# Patient Record
Sex: Female | Born: 1940 | Hispanic: No | Smoking: Never smoker
Health system: Southern US, Community
[De-identification: ages and names within clinical notes are randomized; demographics above are authoritative.]

## PROBLEM LIST (undated history)

## (undated) DIAGNOSIS — R1900 Intra-abdominal and pelvic swelling, mass and lump, unspecified site: Secondary | ICD-10-CM

## (undated) DIAGNOSIS — E785 Hyperlipidemia, unspecified: Secondary | ICD-10-CM

## (undated) DIAGNOSIS — N189 Chronic kidney disease, unspecified: Secondary | ICD-10-CM

## (undated) DIAGNOSIS — K219 Gastro-esophageal reflux disease without esophagitis: Secondary | ICD-10-CM

## (undated) DIAGNOSIS — I10 Essential (primary) hypertension: Secondary | ICD-10-CM

## (undated) DIAGNOSIS — I639 Cerebral infarction, unspecified: Secondary | ICD-10-CM

## (undated) DIAGNOSIS — I4891 Unspecified atrial fibrillation: Secondary | ICD-10-CM

## (undated) DIAGNOSIS — E119 Type 2 diabetes mellitus without complications: Secondary | ICD-10-CM

## (undated) DIAGNOSIS — F0390 Unspecified dementia without behavioral disturbance: Secondary | ICD-10-CM

## (undated) HISTORY — PX: ABDOMINAL HYSTERECTOMY: SHX81

## (undated) HISTORY — PX: LAPAROTOMY: SHX154

## (undated) HISTORY — DX: Unspecified dementia, unspecified severity, without behavioral disturbance, psychotic disturbance, mood disturbance, and anxiety: F03.90

## (undated) HISTORY — DX: Intra-abdominal and pelvic swelling, mass and lump, unspecified site: R19.00

## (undated) HISTORY — DX: Essential (primary) hypertension: I10

## (undated) HISTORY — DX: Cerebral infarction, unspecified: I63.9

---

## 2000-07-26 ENCOUNTER — Inpatient Hospital Stay (HOSPITAL_COMMUNITY): Admission: EM | Admit: 2000-07-26 | Discharge: 2000-08-02 | Payer: Self-pay | Admitting: Emergency Medicine

## 2000-07-26 ENCOUNTER — Encounter: Payer: Self-pay | Admitting: Neurology

## 2000-07-27 ENCOUNTER — Encounter: Payer: Self-pay | Admitting: Neurology

## 2001-12-15 ENCOUNTER — Encounter: Payer: Self-pay | Admitting: Emergency Medicine

## 2001-12-15 ENCOUNTER — Emergency Department (HOSPITAL_COMMUNITY): Admission: EM | Admit: 2001-12-15 | Discharge: 2001-12-15 | Payer: Self-pay | Admitting: Emergency Medicine

## 2002-07-31 ENCOUNTER — Ambulatory Visit (HOSPITAL_COMMUNITY): Admission: RE | Admit: 2002-07-31 | Discharge: 2002-07-31 | Payer: Self-pay | Admitting: Pulmonary Disease

## 2002-07-31 ENCOUNTER — Encounter: Payer: Self-pay | Admitting: Pulmonary Disease

## 2002-08-01 ENCOUNTER — Ambulatory Visit (HOSPITAL_COMMUNITY): Admission: RE | Admit: 2002-08-01 | Discharge: 2002-08-01 | Payer: Self-pay | Admitting: Pulmonary Disease

## 2002-08-01 ENCOUNTER — Encounter: Payer: Self-pay | Admitting: Pulmonary Disease

## 2002-08-21 ENCOUNTER — Other Ambulatory Visit: Admission: RE | Admit: 2002-08-21 | Discharge: 2002-08-21 | Payer: Self-pay | Admitting: Obstetrics and Gynecology

## 2003-01-05 ENCOUNTER — Emergency Department (HOSPITAL_COMMUNITY): Admission: EM | Admit: 2003-01-05 | Discharge: 2003-01-05 | Payer: Self-pay | Admitting: Emergency Medicine

## 2003-05-07 ENCOUNTER — Inpatient Hospital Stay (HOSPITAL_COMMUNITY): Admission: EM | Admit: 2003-05-07 | Discharge: 2003-05-11 | Payer: Self-pay | Admitting: *Deleted

## 2008-05-12 ENCOUNTER — Ambulatory Visit: Payer: Self-pay | Admitting: Cardiovascular Disease

## 2008-05-12 ENCOUNTER — Inpatient Hospital Stay (HOSPITAL_COMMUNITY): Admission: EM | Admit: 2008-05-12 | Discharge: 2008-05-16 | Payer: Self-pay | Admitting: Internal Medicine

## 2008-05-12 ENCOUNTER — Encounter: Payer: Self-pay | Admitting: Emergency Medicine

## 2008-05-13 ENCOUNTER — Encounter (INDEPENDENT_AMBULATORY_CARE_PROVIDER_SITE_OTHER): Payer: Self-pay | Admitting: Internal Medicine

## 2010-10-11 NOTE — Discharge Summary (Signed)
Natalie Rodgers, Natalie Rodgers           ACCOUNT NO.:  [id number]   MEDICAL RECORD NO.:  [id number]          PATIENT TYPE:  INP   LOCATION:  [place]                         FACILITY:  [facility]   PHYSICIAN:  [name], MD    DATE OF BIRTH:  [birth year]   DATE OF ADMISSION:  05/12/2008  DATE OF DISCHARGE:  05/16/2008                               DISCHARGE SUMMARY   DISCHARGE DIAGNOSES:  1. 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  2. Frequent falls secondary to hyponatremia.  3. Hypertension.  4. Hyperlipidemia.  5. Schizophrenia.  6. History of cerebrovascular accident.  7. Diabetes mellitus.   DISCHARGE MEDICATIONS:  1. Aspirin 325 mg once a day.  2. Lipitor 40 mg once a day.  3. Metformin 500 mg b.i.d.  4. Omeprazole 20 mg once a day.  5. Carvedilol 6.25 mg b.i.d.  6. Zyprexa 10 mg once a day.  7. Lotrel 10/40 mg once a day.  8. Trazodone 25 mg at bedtime.   DISCONTINUED MEDICATIONS:  Hydrochlorothiazide.   CONSULTATIONS:  None.   PROCEDURES:  None.   RADIOLOGY STUDIES:  1. Chest x-ray on May 12, 2008, showed cardiomegaly without acute      problem.  2. CT scan of the head without contrast on May 12, 2008, showed      no evidence of acute ischemia, and it showed mild atrophy.  3. MRI of the brain without contrast showed no evidence for acute      ischemia, and it showed old ischemic change in the left cerebral      hemisphere.   FOLLOWUP:  With his primary doctor in 1-2 weeks.   COURSE OF HOSPITALIZATION:  This is a 70 year old female who was brought  to the hospital on May 12, 2008, with main complaint of fatigue and  frequent falls.  Initial workup showed hyponatremia with sodium of 111.  Patient was admitted to step-down unit.  Her hydrochlorothiazide was  discontinued.  Patient was started on normal saline.  Her sodium level  has been checked q.6 h.  Her sodium has been increasing  appropriately.  Today at the time of her discharge, her sodium was back to normal at  135.  Her TSH was checked out, and it was within normal limits.  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   RECOMMENDATIONS:  Discontinue her hydrochlorothiazide and to repeat her  sodium level within 1 week.   Otherwise, Fajr continues to remain stable in the hospital.  Patient will be discharged on all preadmission medications except HCTZ.   Total assessment time is 40 minutes.      [name], MD  Electronically Signed     NAE/MEDQ  D:  05/16/2008  T:  05/16/2008  Job:  [id number]

## 2010-10-11 NOTE — H&P (Signed)
NAMEELDER, WILGER NO.:  [id number]   MEDICAL RECORD NO.:  [id number]          PATIENT TYPE:  EMS   LOCATION:  [place]                         FACILITY:  [facility]   PHYSICIAN:  Rise Patience, MDDATE OF BIRTH:  08-23-1940   DATE OF ADMISSION:  05/12/2008  DATE OF DISCHARGE:                              HISTORY & PHYSICAL   History obtained from ER physician, Dr. Tomi Bamberger, and ER records and the  patient's nursing home records as the patient is not a good historian.   PRIMARY CARE PHYSICIAN:  Per records is Dr. Barth Kirks.   CHIEF COMPLAINT:  Fatigue and frequent falls.   HISTORY OF PRESENT ILLNESS:  A 70 year old female with history of  previous CVA, diabetes mellitus type 2, hypertension, schizophrenia,  history of Mallory-Weiss tear, was brought into the ER after the patient  had frequent falls over the last 2-3 days.  Per ER physician, the  patient has not lost consciousness and the patient is not able to give a  good history if she had lost consciousness.  There were not any new  focal deficits found.  The patient did not complain of any chest pain or  shortness of breath.  No seizure-like activities.  No abdominal pain,  nausea, vomiting or diarrhea.  In the ER, the patient was found to have  a sodium of 111.  The patient has been admitted for further management  of her near syncope and severe hyponatremia.  At this time, the patient  is not in any acute distress.  Denies any chest pain, shortness of  breath, abdominal pain, nausea, vomiting, diarrhea, fever or chills,  headache or any weakness of limb except for fatigue.   PAST MEDICAL HISTORY:  1. Hypertension.  2. Hyperlipidemia.  3. CVA.  4. Schizophrenia.  5. Diabetes mellitus type 2.   PAST SURGICAL HISTORY:  1. The patient has had a left trimalleolar ankle fracture.  2. Mallory-Weiss tear.   MEDICATIONS PRIOR TO ADMISSION:  1. Aspirin 325 mg p.o. daily.  2. Lipitor 40 mg p.o. daily.  3.  Hydrochlorothiazide 25 mg p.o. daily.  4. Metformin 500 mg 1 tablet p.o. b.i.d.  5. Omeprazole 20 mg p.o. daily.  6. Carvedilol 6.25 mg p.o. b.i.d.  7. Zyprexa 10 mg p.o. daily.  8. Lotrel 10/40 mg p.o. daily.  9. Trazodone 50 mg takes 1-1/2 tablets p.o. at bedtime.   ALLERGIES:  NO KNOWN DRUG ALLERGIES.   FAMILY HISTORY:  Not known.   REVIEW OF SYSTEMS:  As per history of present illness.  Nothing else  significant.   PHYSICAL EXAMINATION:  GENERAL:  The patient examined at bedside not in  acute distress.  VITAL SIGNS:  Blood pressure 176/76, pulse 88 per minute, temperature  97.9, respirations 18 per minute, O2 sat 100%.  HEENT:  Anicteric.  No pallor.  Tongue is midline.  There is mild  deviation of his face to the left.  Patient has had a previous stroke.  CHEST:  Bilateral air entry present.  No rhonchi.  No crepitation.  HEART:  S1 and S2 heard.  ABDOMEN:  Soft, nontender.  Bowel  sounds heard.  There is mild  distention.  No guarding or rigidity.  CNS:  The patient is alert, awake, oriented to name and place.  LOWER EXTREMITIES:  Peripheral pulses felt.  No edema.   DIAGNOSTICS:  1. CT of the head without contrast shows no definite acute      intracranial findings, remote infarctions and chronic small vessel      disease changes described above.  No hemorrhage.  Left upper      parietal scalp hematoma.  2. Chest x-ray:  There is enlargement of the cardiac silhouette with      no pulmonary edema evident.  No fractures evident.   LABORATORY DATA:  CBC:  WBC is 17.3, hemoglobin 13.4, hematocrit 39.2,  platelets 343, neutrophils 90%.  Basic metabolic panel:  Sodium 99991111,  potassium 3.5, chloride 70, carbon dioxide 25, glucose 143, BUN 12,  creatinine 0.6, calcium 9.2, anion gap 16, calcium 9.2.  UA shows  negative for nitrites, leukocyte, wbc's 0.   ASSESSMENT:  1. Severe hyponatremia probably from dehydration and      hydrochlorothiazide.  2. Near syncope from  dehydration.  3. Anion gap metabolic acidosis.  4. Diabetes mellitus type 2.  5. Old cerebrovascular accident.  6. Schizophrenia.  7. Hypertension.   PLAN:  We will admit the patient to step-down unit for close  observation.  We will discontinue hydrochlorothiazide and will recommend  not to restart hydrochlorothiazide in this patient in the future.  We  will place patient on IV fluid normal saline.  Check BMET every 6 hours.  Check urine for sodium osmolality.  Check a TSH and cortisol levels.  We  will get blood cultures as the patient has leukocytosis.  At this time,  it does not look as the patient has any infection.  The patient has no  fever.  Chest x-ray looks fine and UA has no features of UTI.  We will  also get urine culture.  Further recommendation as patient condition  evolves.  I have tried to reach the patient's contact number provided in  the chart and was not able to reach.     Rise Patience, MD  Electronically Signed    ANK/MEDQ  D:  05/12/2008  T:  05/12/2008  Job:  [id number]

## 2010-10-14 NOTE — H&P (Signed)
NAMEBIANCE, DIERKER NO.:  [id number]   MEDICAL RECORD NO.:  [id number]                   PATIENT TYPE:  INP   LOCATION:  2112                                 FACILITY:  [facility]   PHYSICIAN:  Anderson Malta, M.D.                 DATE OF BIRTH:  September 14, 1940   DATE OF ADMISSION:  05/07/2003  DATE OF DISCHARGE:                                HISTORY & PHYSICAL   CHIEF COMPLAINT:  Left ankle pain.   HISTORY OF PRESENT ILLNESS:  Natalie Rodgers is a 70 year old female who  fell today at the nursing home.  She sustained an injury to the left ankle.  She denies any loss of consciousness or any other orthopedic complaints.   ALLERGIES:  She has no known drug allergies.   CURRENT MEDICATIONS:  Aspirin, Ambien, Lotrel, hydrochlorothiazide,  lisinopril, Zyprexa, Senokot.   PAST MEDICAL HISTORY:  Past medical is notable for:  1. Non-insulin-dependent diabetes.  2. Hypertension.  3. Schizophrenia.  4. CVA.   FAMILY HISTORY:  Family history is noncontributory.   SOCIAL HISTORY:  The patient lives at the nursing home.   REVIEW OF SYSTEMS:  There is no chest pain, fever, chills or shortness of  breath.  Other systems are reviewed and are negative.   PHYSICAL EXAMINATION:  GENERAL:  On physical examination, the patient is not  in much distress.  HEENT:  Normal.  NECK:  Neck has full range of motion.  UPPER EXTREMITIES:  Bilateral upper extremities have full range of motion at  the wrists, elbows and shoulders.  CHEST:  Chest is clear to auscultation.  HEART:  Heart beat is regular rhythm.  ABDOMEN:  Exam is benign.  LOWER EXTREMITIES:  The patient does have a palpable left DP pulse 2+/4.  Sensation grossly intact on dorsoplantar aspect of the foot.  Skin is intact  on the medial and lateral malleoli.  Swelling and ecchymosis and bruising  are present.   IMAGING STUDIES:  X-rays show trimalleolar ankle fracture with displacement.   White count 9.9,  hemoglobin 12.2.  Sodium 134, potassium 4.1, BUN 14,  creatinine 0.8.   EKG and chest x-ray show no active processes.   IMPRESSION:  Trimalleolar ankle fracture.   PLAN:  Open reduction and internal fixation.  Risks and benefits including  but not limited to infection, nonunion, malunion, nerve and vessel damage,  amputation, DVT and death are discussed with the patient; patient  understands and will proceed; all questions answered.                                                Anderson Malta, M.D.    GSD/MEDQ  D:  05/07/2003  T:  05/08/2003  Job:  [id number]

## 2010-10-14 NOTE — Op Note (Signed)
Natalie Rodgers, Natalie Rodgers NO.:  [id number]   MEDICAL RECORD NO.:  [id number]                   PATIENT TYPE:  INP   LOCATION:  2112                                 FACILITY:  [facility]   PHYSICIAN:  Anderson Malta, M.D.                 DATE OF BIRTH:  1941/05/03   DATE OF PROCEDURE:  05/07/2003  DATE OF DISCHARGE:                                 OPERATIVE REPORT   PREOPERATIVE DIAGNOSIS:  Left trimalleolar ankle fracture.   POSTOPERATIVE DIAGNOSIS:  Left trimalleolar ankle fracture.   PROCEDURE:  Left trimalleolar ankle fracture open reduction internal  fixation.   SURGEON:  Anderson Malta, M.D.   ANESTHESIA:  General endotracheal.   ESTIMATED BLOOD LOSS:  25 cc.   DRAINS:  None.   TOURNIQUET TIME:  Ankle Esmarched for 60 minutes.   PROCEDURE IN DETAIL:  The patient was brought to the operating room where  general endotracheal anesthesia was induced.  Preoperative antibiotics were  administered.  The left leg was prepped with Duraprep solution and draped in  a sterile manner.  Charlie Pitter was used to cover the operative field.  The leg was  elevated and exsanguinated with the Esmarch wrap.  The Esmarch wrap was  taken up to the mid-calf level where it was maintained.  Lateral incision  was made measuring approximately 9 cm.  Skin and subcutaneous tissue were  sharply divided.  Superficial peroneal nerve was mobilized anteriorly.  The  fracture site was visualized.  The fracture was reduced under direct  visualization to an anatomic fashion.  Lag screw was placed from proximal  anterior to distal posterior.  The 3.5 cortical screw gave excellent  compression-fixation of the fracture.  A seven-hole one-third tubular plate  was then placed to further stabilize the fracture.  This tubular plate was  the locking variety.  Six holes were filled with locking screws.  Reduction  in the AP and lateral planes under fluoroscopy looked anatomic.   At this time the  medial side was approached.  A J-shaped incision was made  centered over the fracture of the medial malleolus.  The skin and  subcutaneous tissue were sharply divided.  The fracture site was visualized.  The loose fragments of bone were removed from within the joint.  The medial  malleolus was the anatomically reduced and held in position with a towel  clamp.  Two 4 mm cancellous screws were then placed.  Reduction was  anatomic.  Syndesmosis was tested and was found to be stable.  At this time  the tourniquet was released.  Bleeding points encountered were controlled  with electrocautery.  Thorough irrigation was performed in both incisions.  Both incisions were then closed using interrupted and inverted 3-0 Vicryl  suture and 3-0 nylon suture.  Bulky posterior splint was applied.  The  patient tolerated the procedure well without any complications.  Anderson Malta, M.D.   GSD/MEDQ  D:  05/07/2003  T:  05/08/2003  Job:  [id number]

## 2010-10-14 NOTE — Consult Note (Signed)
[facility]  Patient:    Natalie Rodgers, Natalie Rodgers                    MRN: [id number] Proc. Date: 08/01/00 Adm. Date:  [id number] Attending:  Lenor Coffin CC:         Princess Bruins. Gaynell Face, M.D.   Consultation Report  REASON FOR CONSULTATION:  I was asked to see this 70 year old lady for evaluation of voiding dysfunction.  She was hospitalized with a stroke.  She was a rest home patient.  She also has a history of schizophrenia.  Current medications are Zantac, Cogentin, and Thorazine on admission. Allergies to drugs are denied.  A Foley catheter was placed earlier this week and then it was removed for a voiding trial.  She voided 150-300 cc at a time but had a very large PVR, so Foley catheter was reinserted.  A recent urine culture is negative.  Her renal function is normal with a serum creatinine of 0.7.  PHYSICAL EXAMINATION:  GENERAL:  She is difficult to communicate because of medical problems and some language barriers.  ABDOMEN:  Foley catheter is in place draining clear urine.  The abdomen is soft and nontender.  I suggest we try and get this Foley out with a voiding trial.  I will put her on Flomax 0.4 mg with a starter dose tonight and then one capsule 30 minutes PC breakfast each day.  We can try the Foley out on Friday, August 03, 2000, and then do I&O caths every shift for PVRs, and I will recheck her next week when I return from being out of town, on Monday, August 06, 2000. DD:  08/01/00 TD:  08/02/00 Job: [id number]

## 2010-10-14 NOTE — Consult Note (Signed)
NAMEKIJAH, POMPILIO NO.:  [id number]   MEDICAL RECORD NO.:  [id number]                   PATIENT TYPE:  INP   LOCATION:  2112                                 FACILITY:  [facility]   PHYSICIAN:  Jeneen Rinks L. Rolla Flatten., M.D.          DATE OF BIRTH:  Apr 28, 1941   DATE OF CONSULTATION:  05/07/2003  DATE OF DISCHARGE:                                   CONSULTATION   REASON FOR CONSULTATION:  GI bleeding.   HISTORY:  Ms. Violet is a 70 year old woman without lives in a rest home  due to multiple problems, who apparently fell and fractured her ankle.  She  was sent to the emergency room.  According to the note from the transport,  think she received ibuprofen 800 mg due to pain.  I do not have a complete  list of her medications.  I do not see any obvious drug list from the rest  home, so not entirely sure what the patient was taking.  She fractured her  ankle and went to surgery, had the ankle repaired surgically by Dr. Alphonzo Severance.  Intubation prior to the procedure was very difficulty, and several  people had to attempt it, and they were finally able to get her intubated.  They had a oral suction device in, and only a small amount of blood at the  time of surgery was seen.  Surgery went uneventfully.  In the recovery room,  she vomited up a large quality of bright red blood.  NG tube was placed and  was draining bright red blood.  The patient is awake now, still somewhat  confused and disoriented, groggy.  Not sure what her normal mental state is.   Her hemoglobin was 12.2 on admission, and repeat hemoglobin showed a  hemoglobin drop to 11.4.  We were asked to see her due to GI bleeding.  The  patient is awake enough to answer some questions.  She denies every having a  history of previous ulcer disease and denies a history of liver disease.   MEDICATIONS ON ADMISSION:  Ambien, aspirin, Lotrel. Hydrochlorothiazide,  Lisinopril, Zyprexa, Senokot.   ALLERGIES:  No known drug allergies.   PAST MEDICAL HISTORY:  1. History of previous strokes making it difficult for her to get around.  2. Schizophrenia.  3. Non-insulin-dependent diabetes.  4. Hypertension.   PAST SURGICAL HISTORY:  Her only known surgery at this point is surgery  recently on her ankle.   SOCIAL HISTORY:  She does have three children according to the old records  but is currently living in a rest home.  She denies any alcohol use or any  cigarette use.   PHYSICAL EXAMINATION:  VITAL SIGNS:  When I examined her in the recovery  room, her vital signs were stable follow surgery.  GENERAL:  She is awake but groggy.  NG tube is draining a small amount of  thin, bright red material  after lavage.  HEENT:  Eyes: Sclerae anicteric.  Throat: I did examine her with her mouth  open as much she would with a wipe and tongue blade.  We also put the  Yankauer suction tip in the back of her throat and sucked a small amount of  bright blood.  I was able to see ecchymoses of the palate without clear  active bleeding but some bright bleeding in the Yankauer suction.  This was  a small amount.  NECK:  Fat, short, and fairly immobile which probably explains the  intubation difficulties.  HEART:  Regular rate and rhythm without murmurs or gallops.  LUNGS:  Clear.  ABDOMEN:  Soft, nontender, with good bowel sounds.   IMPRESSION:  Acute upper gastrointestinal bleed, either due from a lesion in  the stomach, the esophagus, or more likely from traumatic intubation.  Many  possibilities here including occult ulcer disease, Mallory Weiss tear,  phalangeal tear during intubation, etc.  Hopefully all of these will  resolve without any specific intervention.  The patient does seem relatively  stable at this point.   PLAN:  I agree with continuing NG suction and empirically giving IV  Protonix.  If her NG aspirate is not clear, she will likely need an  endoscopy and possibly an EMG  evaluation.                                               James L. Rolla Flatten., M.D.    Jaynie Bream  D:  05/07/2003  T:  05/08/2003  Job:  [id number]   cc:   G. Alphonzo Severance, M.D.  [address] [place]  [place] [address]  Fax: [phone]

## 2010-10-14 NOTE — Discharge Summary (Signed)
NAMENYKERRIA, STUART NO.:  [id number]   MEDICAL RECORD NO.:  [id number]                   PATIENT TYPE:  INP   LOCATION:  [id number]                                 FACILITY:  [facility]   PHYSICIAN:  Anderson Malta, M.D.                 DATE OF BIRTH:  07-31-1940   DATE OF ADMISSION:  05/07/2003  DATE OF DISCHARGE:  05/11/2003                                 DISCHARGE SUMMARY   DISCHARGE DIAGNOSES:  1. Left trimalleolar ankle fracture.  2. Gastrointestinal bleed secondary to Mallory-Weiss tear.   PROCEDURE:  Left trimalleolar ankle fracture reduction and internal  fixation.   HOSPITAL COURSE:  Natalie Rodgers is a 70 year old patient who injured  herself in the nursing home on the day of admission.  She sustained a closed  trimalleolar ankle fracture.  Patient was taken to surgery that day where  she underwent open reduction with internal fixation of the ankle fracture.  In the recovery room, the patient was noted to be throwing up large amounts  of blood.  GI consultation was obtained.  Upper endoscopy the following  morning demonstrated a Mallory-Weiss tear as well as evidence of a traumatic  intubation.  No active bleeding seen.  The patient was transfused with two  units of packed red blood cells and had a stable hematocrit with no further  hematemesis.  The patient was mobilized with physical therapy and  nonweightbearing on the left lower extremity.  Toes were perfused and mobile  at the time of discharge.  Hemoglobin was 10.1 at the time of discharge.   DISCHARGE MEDICATIONS:  1. Admission medications.  2. Aspirin 325 mg p.o. daily.  3. Protonix 40 mg p.o. daily.   She will follow up with me in about 7-8 days for suture removal.  She will  continue with a nonweightbearing status, nonweightbearing in the splint.                                                Anderson Malta, M.D.    GSD/MEDQ  D:  05/10/2003  T:  05/10/2003  Job:  [id number]

## 2010-10-14 NOTE — Op Note (Signed)
Natalie Rodgers, DORING NO.:  [id number]   MEDICAL RECORD NO.:  [id number]                   PATIENT TYPE:  INP   LOCATION:  2112                                 FACILITY:  [facility]   PHYSICIAN:  Jeneen Rinks L. Rolla Flatten., M.D.          DATE OF BIRTH:  Apr 28, 1941   DATE OF PROCEDURE:  05/08/2003  DATE OF DISCHARGE:                                 OPERATIVE REPORT   PROCEDURE:  Esophagogastroduodenoscopy.   MEDICATIONS GIVEN:  Cetacaine spray, Fentanyl 50 mcg, Versed 5 mg IV.   INDICATIONS FOR PROCEDURE:  The patient had an ankle fracture and went to  surgery last night.  She had an difficulty intubation following surgery,  vomited up a large quantity of blood, NG continued to drain blood.  We  watched her during the night, it has not cleared up, her hemoglobin has  dropped and she has needed one unit of transfusion.   DESCRIPTION OF PROCEDURE:  The procedure was explained to the patient and  consent obtained.  With the patient in the left lateral decubitus position,  the scope was inserted blindly and advanced.  There was a lot of edema and  ecchymosis in the pharynx but no active bleeding.  We passed down into the  stomach.  In the distal esophagus was a Mallory-Weiss tear/esophageal ulcer  that was linear just above the GE junction, was not actively bleeding.  There was a lot of old blood in the stomach, no active bleeding, no  ulceration.  The pyloric channel was seen, was passed, and was normal.  No  bleeding site was seen.  The scope was withdrawn, the stomach examined  again, again, no active bleeding site, a lot of old coffee ground material.  The Mallory-Weiss tear/ulcer was photographed.  The distal and proximal  esophagus were seen well endoscopically.  As we withdrew back into the  pharynx in the vicinity of the vocal cords, a lot of edema and ecchymosis  was seen, but no active bleeding.  The patient tolerated the procedure well.   ASSESSMENT:  1. 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  2. Pharyngeal trauma secondary to intubation not bleeding.   PLAN:  Will continue on PPI, give ice chips and clear liquids, follow  clinically.                                               James L. Rolla Flatten., M.D.    Jaynie Bream  D:  05/08/2003  T:  05/08/2003  Job:  [id number]   cc:   G. Alphonzo Severance, M.D.  [address] [place]  [place] [address]  Fax: [phone]

## 2010-10-14 NOTE — H&P (Signed)
[place]. [facility]  Patient:    Natalie Rodgers, Natalie Rodgers                    MRN: [id number] Adm. Date:  [id number] Attending:  Willy Eddy CC:         [name]. [name], M.D.   History and Physical  HISTORY OF PRESENT ILLNESS:  Natalie Rodgers is a 70 year old, right-handed female, born on [birth year], with a history of schizophrenia, who is at [facility] currently.  This patient was brought in with a greater than 24-hour history of right-sided weakness.  The problems began yesterday.  The patient noted some weakness on the right side. No numbness or headache.  Possibly some slurred speech.  The patient had falls last evening.  The weakness persisted and the patient was brought into the hospital today for an evaluation.  The patient again denies any visual changes, numb or tingling sensation, or blackout episodes.  The patient denies problems with swallowing.  The patient is being admitted at this time for a mild right hemiparesis with probable deep left brain stroke.  A CT of the head shows significant calcifications of the pineal gland, basal ganglia regions, and choroid plexus regions, and may show what appears to be some acute left deep parietal white matter and frontal white matter changes.  Some chronic ischemic changes seen in the head of the caudate basal ganglia regions.  PAST MEDICAL HISTORY: 1. Significant for a history of new onset of stroke with right hemiparesis. 2. Obesity. 3. History of schizophrenia.  CURRENT MEDICATIONS: 1. Zantac 150 mg b.i.d. 2. Cogentin 1 mg q.d. 3. Thorazine 200 mg q. night.  ALLERGIES:  No known drug allergies.  SOCIAL HISTORY:  Does not smoke or drink.  SOCIAL HISTORY:  The patient is divorced.  She lives at the [facility].  She has three children.  As far as she knows, they are alive and well.  FAMILY HISTORY:  Her mother died with a myocardial infarction.   Fathers history is unknown.  The patient has one brother and five sisters, who as far as she knows, are alive and well.  REVIEW OF SYSTEMS:  Notable for no fevers, no headaches, no visual field changes, no swallowing problems, no shortness of breath, chest pain, nausea, or vomiting.  The patient denies any problems controlling the bowels and the bladder.  Has had some difficulty walking, and has had some recent falls.  PHYSICAL EXAMINATION:  VITAL SIGNS:  Blood pressure 175/103, heart rate 103, respirations 24, temperature afebrile.  GENERAL:  This patient is a moderately-obese female who is alert, cooperative at the time of examination.  Speech:  Has a heavy accent.  Difficult to understand.  Possibly slightly dysarthric.  HEENT:  Extraocular movements full.  Pupils equal, round, reactive to light. Discs are flat bilaterally.  Cataracts are seen bilaterally.  NECK:  Supple, no carotid bruits noted.  LUNGS:  Clear to auscultation and percussion.  CARDIOVASCULAR:   Reveals a regular rate and rhythm.  No obvious murmurs or rubs noted.  ABDOMEN:  Reveals an obese abdomen.  No organomegaly or tenderness noted.  EXTREMITIES:  Without significant edema, possibly 1+ edema at the ankles bilaterally.  NEUROLOGIC:  Cranial nerves as above.  Facial symmetry appears to be present. The patient has good pinprick sensation on the face bilaterally.  The patient has full extraocular movements.  Visual fields are full to double simultaneous stimulation.  Speech  is mildly dysarthric.  The patient has good grip strength bilaterally.  Does have some mild drift on the right upper extremity.  Normal strength on the left.  The patient has trace weakness of the right leg, the left.  Deep tendon reflexes are relatively symmetric throughout.  Toes are neutral bilaterally.  The patient has 1-2+ ataxia with finger-to-nose-to-finger with the right arm, and normal with the left.  The patient is able to  perform toe-to-finger bilaterally, fairly symmetrically with the lower extremities.  The patient is not ambulated.  The patient notes good symmetry to pinprick, soft touch, and vibratory sensation throughout.  LABORATORY DATA:  Glucose 138, BUN 12, sodium 139, potassium 3.7, chloride 111, CO2 of 27.  Hemoglobin 13, hematocrit 38.  Creatinine 0.6.  White count 8.8, hemoglobin 14.3, hematocrit 42.3, MCV 93.4, platelets 330, and coags are unremarkable.  Electrocardiogram reveals normal sinus rhythm, normal electrocardiogram, heart rate of 92.  CT scan is as above.  IMPRESSION: 1. Probable new onset of deep parietal and frontal white matter infarction,    with mild right hemiparesis. 2. Schizophrenia. 3. Obesity.  This patient has a mild motor deficit on the right.  The patient hopefully will do quite well during this hospitalization.  She has not been on any antihypertensives, and has not been on aspirin prior to this admission.  Will not use heparin at this point, as the stroke is over 24 hours old.  PLAN: 1. Admission to [facility]. 2. Aspirin therapy. 3. IV fluids. 4. MRI scan of the brain. 5. MR angiogram. 6. Carotid Doppler study. 7. A 2-D echocardiogram. 8. PT and OT and speech therapy evaluations. 9. Will follow the clinical course while in-house. DD:  07/26/00 TD:  07/26/00 Job: [id number]

## 2010-10-14 NOTE — Discharge Summary (Signed)
Natalie Rodgers. [facility]  Patient:    Natalie Rodgers, Natalie Rodgers                    MRN: [id number] Adm. Date:  [id number] Disc. Date: 08/02/00 Attending:  Lenor Coffin CC:         [name]. [name], M.D.  [name], M.D.  [name]. [name], M.D.   Discharge Summary  DATE OF BIRTH:  [birth year]  FINAL DIAGNOSES: 1. Acute left brain subcortical cerebrovascular accident. 2. Obesity. 3. History of schizophrenia. 4. Urinary retention.  PROCEDURE:  MRI brain.  MRA intracranial.  A 2-D echocardiogram.  Carotid Doppler.  COMPLICATIONS:  None.  SUMMARY:  The patient is a 70 year old woman who was admitted on an emergent basis with a greater than 24 history of right-sided weakness.  Patient had weakness in the right side without numbness or headache and with slurred speech.  She had had falls the evening prior to admission.  She was brought to the hospital for evaluation.  CT scan of the head showed calcifications to the pineal gland, basal ganglia regions, cord plexus regions, and also an acute left deep parietal and frontal white matter stroke.  Patient had chronic ischemic changes seen in the head of the caudate.  PAST MEDICAL HISTORY:  Remarkable for obesity and schizophrenia.  MEDICATIONS: 1. Zantac 150 mg b.i.d. 2. Cogentin 1 mg q.d. 3. Thorazine 200 mg q.h.s.  ALLERGIES:  No known drug allergies.  HOSPITAL COURSE:  Patients evaluations were as follows:  MRI of the brain July 26, 2000 showed an acute infarction involving the deep lateral periventricular white matter of the left parietal lobe in the posterior aspect of the left ______ seen best on diffusion weighted imaging.  In addition, there is evidence of lacunar infarctions involving the posterior aspect of the left putamen and the anterior limb of the left internal capsule that are remote.  Patient had scattered foci of increased signal intensity in the cerebral white  matter diffusely distributed.  There was also a small remote lacunar infarction involving the right thalamus.  All arteries of ______ were open and patent.  The hypodensities and globus ______ represented significant calcification and/or iron ______ within the basal ganglia.  MR angiography showed patent vertebrobasilar and internal carotid arteries. There was some atherosclerotic irregularity in the M1 segments of both middle cerebral arteries and also medium and large intracranial branches of the anterior and posterior circulation.  There is a fetal origin of the left posterior cerebral artery directly off the left internal carotid and hypoplasia of the A1 segment in the left anterior cerebral artery with both anterior cerebral arteries originating from the right internal carotid.  Chest x-ray in two views showed mild cardiomegaly with no active lung disease (July 26, 2000).  Swallowing studies showed no evidence of aspiration of any consistency, transient penetration with thin liquids was resolved with a chin tuck.  A 2-D echocardiogram showed normal left ventricular systolic function with ejection fraction estimated at 55-65%.  Left ventricular wall thickness was mildly increased.  Aortic valve thickness was mildly increased.  Trivial aortic valvular regurgitation was seen.  There was moderate dilatation at the aortic root.  Left atrium was mildly dilated.  No source of emboli were seen.  EKG showed a normal sinus rhythm with nonspecific ST and T-wave abnormalities.  Carotid Doppler study showed no significant plaque in the anterior circulation, in the internal, external, or common carotid arteries.  Vertebral artery flow  was antegrade bilaterally.  LABORATORIES:  Glycosylated hemoglobin 6.1.  Initial pH 7.40, PCO2 44.5, PO2 28.0 on a venous gas.  Initial white count 8800, hemoglobin 14.3, hematocrit 42.3, MCV 93.4, platelet count 330,000, 83 polies, 13 lymphs, 4 monos,  ______ eosinophil.  Sedimentation rate was 16.  Initial PT 11.4, PTT 26. Comprehensive metabolic panel carried out July 27, 2000:  Sodium 145, potassium 3.3, chloride 111, CO2 27, glucose 112, BUN 9, creatinine 0.7, calcium 8.9, total protein 6.7, albumin 3.2 (low), AST 16, ALT 14, ALP 66, total bilirubin 0.5, serum homocystine 6.51 (normal).  Lipid profile: Cholesterol 190, triglycerides 59, HDL cholesterol 66, LDL cholesterol 112, total cholesterol/HDL cholesterol ratio 2.9 which is less than half the average risk for a woman.  Urinalysis:  Specific gravity 1.008, pH 6.5.  All chemistries negative.  The initial had shown a red blood cell count of 21-50 on March 1.  TSH was 1.412.  Urine catheter culture was negative.  Patient had isolated glucoses that were elevated in the 125-143 range but the fasting glucose was 112 and the glycosylated hemoglobin was normal.  The only other problem noted was that the patient had urinary retention.  She had a catheterized UA.  1250 cc were removed.  Prior to catheterization she had voided 200 cc, 300 cc, and 150 cc.  Patient voided 200 cc again prior to Foley being placed and 1200 cc of postvoid residual was seen.  Despite this the patient did not have evidence of a urinary tract infection.  PHYSICAL EXAMINATION  VITAL SIGNS:  Blood pressure 134/84, resting pulse 93, respirations 20, temperature 97.8.  GENERAL:  Patient appeared to be more alert.  LUNGS:  Clear.  HEART:  No murmurs.  Pulses normal.  ABDOMEN:  Soft.  Bowel sounds normal.  EXTREMITIES:  Well formed.  On her right side her strength is as follows: Deltoid 3, triceps 4, biceps 4, wrist extensors and flexors 3, grip 4-, hip flexors 3, knee extensors 4+, foot plantiflexors 4+.  The left side is normal other than hip flexors which are 4.   NEUROLOGIC:  Patient had mild right central seventh.  There was no evidence of a field cut.  Patient had normal sensation to pin prick.   There was a right extensor and left flexor plantar response.  Patient did not have a reflex predominance.  She is discharged in somewhat improved condition with a persistent right hemiparesis.  She is judged not to be safe to return to her assisted living group.  I had her seen today by Dr. Hessie Diener who recommended placing her on Flomax 0.4 mg q.d. 30 minutes after breakfast.  Foley catheter will be discontinued and the patient will need in and out catheterizations to check for postvoid residual at least q.i.d.  If the patient continues to show large postvoid residual, catheter may need to be replaced.  Patient can be on a soft mechanical diet, but will need to tuck her chin in order to swallow safely.  DISCHARGE MEDICATIONS:  1. Thorazine 200 mg q.h.s. (100 mg tablets).  2. Cogentin 1 mg at bedtime.  3. Pepcid 20 mg b.i.d.  4. Plavix 75 mg q.d.  5. Enteric coated aspirin 325 mg q.d.  6. Potassium chloride 20 mEq b.i.d.  7. Hydrochlorothiazide 25 mg q.d.  8. Restoril 30 mg at bedtime p.r.n.  9. Tylenol 325 mg q.4h. as needed for pain or temperature. 10. Laxative of choice p.r.n.  Patient is discharged in good condition, the only  concern being that postvoid residuals be checked with a decision made whether or not to replace the indwelling Foley catheter.  All questions regarding this should be directed by Dr. Hessie Diener.  We will be happy to see the patient for neurologic conditions at [facility].  Ike Bene, PA would see the patient on a day that I was present.  If patient has further atherosclerotic cerebrovascular disease, we will be happy to see her and assist in her care. DD:  08/01/00 TD:  08/01/00 Job: [id number]

## 2011-03-02 LAB — URINALYSIS, ROUTINE W REFLEX MICROSCOPIC
Bilirubin Urine: NEGATIVE
Glucose, UA: 100 mg/dL — AB
Hgb urine dipstick: NEGATIVE
Ketones, ur: 15 mg/dL — AB
Leukocytes, UA: NEGATIVE
Nitrite: NEGATIVE
Protein, ur: 100 mg/dL — AB
Specific Gravity, Urine: 1.018 (ref 1.005–1.030)
Urobilinogen, UA: 1 mg/dL (ref 0.0–1.0)
pH: 7.5 (ref 5.0–8.0)

## 2011-03-02 LAB — BASIC METABOLIC PANEL
BUN: 12 mg/dL (ref 6–23)
CO2: 25 mEq/L (ref 19–32)
Calcium: 9.2 mg/dL (ref 8.4–10.5)
Chloride: 70 mEq/L — CL (ref 96–112)
Creatinine, Ser: 0.66 mg/dL (ref 0.4–1.2)
GFR calc Af Amer: >60 mL/min (ref >60)
GFR calc non Af Amer: >60 mL/min (ref >60)
Glucose, Bld: 143 mg/dL — ABNORMAL HIGH (ref 70–99)
Potassium: 3.5 mEq/L (ref 3.5–5.1)
Sodium: 111 mEq/L — CL (ref 135–145)

## 2011-03-02 LAB — URINE MICROSCOPIC-ADD ON

## 2011-03-02 LAB — TROPONIN I: Troponin I: <0.01 ng/mL (ref 0.00–0.06)

## 2011-03-02 LAB — CK TOTAL AND CKMB (NOT AT ARMC)
CK, MB: 10.4 ng/mL — ABNORMAL HIGH (ref 0.3–4.0)
Relative Index: 2.1 (ref 0.0–2.5)
Total CK: 495 U/L — ABNORMAL HIGH (ref 7–177)

## 2011-03-02 LAB — DIFFERENTIAL
Basophils Absolute: 0 10*3/uL (ref 0.0–0.1)
Basophils Relative: 0 % (ref 0–1)
Eosinophils Absolute: 0 10*3/uL (ref 0.0–0.7)
Eosinophils Relative: 0 % (ref 0–5)
Lymphocytes Relative: 4 % — ABNORMAL LOW (ref 12–46)
Lymphs Abs: 0.6 10*3/uL — ABNORMAL LOW (ref 0.7–4.0)
Monocytes Absolute: 1.1 10*3/uL — ABNORMAL HIGH (ref 0.1–1.0)
Monocytes Relative: 6 % (ref 3–12)
Neutro Abs: 15.5 10*3/uL — ABNORMAL HIGH (ref 1.7–7.7)
Neutrophils Relative %: 90 % — ABNORMAL HIGH (ref 43–77)

## 2011-03-02 LAB — LIPID PANEL
Cholesterol: 120 mg/dL (ref 0–200)
HDL: 73 mg/dL (ref >39)
LDL Cholesterol: 40 mg/dL (ref 0–99)
Total CHOL/HDL Ratio: 1.6 RATIO
Triglycerides: 34 mg/dL (ref <150)
VLDL: 7 mg/dL (ref 0–40)

## 2011-03-02 LAB — URINE CULTURE: Colony Count: >=100000

## 2011-03-02 LAB — CBC
HCT: 39.2 % (ref 36.0–46.0)
Hemoglobin: 13.4 g/dL (ref 12.0–15.0)
MCHC: 34.1 g/dL (ref 30.0–36.0)
MCV: 94.5 fL (ref 78.0–100.0)
Platelets: 343 10*3/uL (ref 150–400)
RBC: 4.15 MIL/uL (ref 3.87–5.11)
RDW: 13 % (ref 11.5–15.5)
WBC: 17.3 10*3/uL — ABNORMAL HIGH (ref 4.0–10.5)

## 2011-03-02 LAB — GLUCOSE, CAPILLARY: Glucose-Capillary: 172 mg/dL — ABNORMAL HIGH (ref 70–99)

## 2011-03-02 LAB — TSH: TSH: 0.85 u[IU]/mL (ref 0.350–4.500)

## 2011-03-03 LAB — CARDIAC PANEL(CRET KIN+CKTOT+MB+TROPI)
CK, MB: 5.9 ng/mL — ABNORMAL HIGH (ref 0.3–4.0)
CK, MB: 7.7 ng/mL — ABNORMAL HIGH (ref 0.3–4.0)
Relative Index: 1.9 (ref 0.0–2.5)
Relative Index: 2.3 (ref 0.0–2.5)
Total CK: 312 U/L — ABNORMAL HIGH (ref 7–177)
Total CK: 333 U/L — ABNORMAL HIGH (ref 7–177)
Troponin I: 0.01 ng/mL (ref 0.00–0.06)
Troponin I: <0.01 ng/mL (ref 0.00–0.06)

## 2011-03-03 LAB — GLUCOSE, CAPILLARY
Glucose-Capillary: 108 mg/dL — ABNORMAL HIGH (ref 70–99)
Glucose-Capillary: 115 mg/dL — ABNORMAL HIGH (ref 70–99)
Glucose-Capillary: 121 mg/dL — ABNORMAL HIGH (ref 70–99)
Glucose-Capillary: 121 mg/dL — ABNORMAL HIGH (ref 70–99)
Glucose-Capillary: 128 mg/dL — ABNORMAL HIGH (ref 70–99)
Glucose-Capillary: 133 mg/dL — ABNORMAL HIGH (ref 70–99)
Glucose-Capillary: 135 mg/dL — ABNORMAL HIGH (ref 70–99)
Glucose-Capillary: 140 mg/dL — ABNORMAL HIGH (ref 70–99)
Glucose-Capillary: 144 mg/dL — ABNORMAL HIGH (ref 70–99)
Glucose-Capillary: 145 mg/dL — ABNORMAL HIGH (ref 70–99)
Glucose-Capillary: 162 mg/dL — ABNORMAL HIGH (ref 70–99)
Glucose-Capillary: 89 mg/dL (ref 70–99)

## 2011-03-03 LAB — BASIC METABOLIC PANEL
BUN: 10 mg/dL (ref 6–23)
BUN: 11 mg/dL (ref 6–23)
BUN: 11 mg/dL (ref 6–23)
BUN: 12 mg/dL (ref 6–23)
BUN: 12 mg/dL (ref 6–23)
BUN: 13 mg/dL (ref 6–23)
BUN: 18 mg/dL (ref 6–23)
BUN: 7 mg/dL (ref 6–23)
CO2: 24 mEq/L (ref 19–32)
CO2: 26 mEq/L (ref 19–32)
CO2: 27 mEq/L (ref 19–32)
CO2: 29 mEq/L (ref 19–32)
Calcium: 8.2 mg/dL — ABNORMAL LOW (ref 8.4–10.5)
Calcium: 8.6 mg/dL (ref 8.4–10.5)
Calcium: 8.7 mg/dL (ref 8.4–10.5)
Calcium: 8.9 mg/dL (ref 8.4–10.5)
Calcium: 9 mg/dL (ref 8.4–10.5)
Chloride: 102 mEq/L (ref 96–112)
Chloride: 72 mEq/L — CL (ref 96–112)
Chloride: 78 mEq/L — CL (ref 96–112)
Chloride: 81 mEq/L — ABNORMAL LOW (ref 96–112)
Chloride: 95 mEq/L — ABNORMAL LOW (ref 96–112)
Creatinine, Ser: 0.5 mg/dL (ref 0.4–1.2)
Creatinine, Ser: 0.56 mg/dL (ref 0.4–1.2)
Creatinine, Ser: 0.58 mg/dL (ref 0.4–1.2)
Creatinine, Ser: 0.63 mg/dL (ref 0.4–1.2)
Creatinine, Ser: 0.72 mg/dL (ref 0.4–1.2)
Creatinine, Ser: 0.75 mg/dL (ref 0.4–1.2)
GFR calc Af Amer: >60 mL/min (ref >60)
GFR calc Af Amer: >60 mL/min (ref >60)
GFR calc Af Amer: >60 mL/min (ref >60)
GFR calc Af Amer: >60 mL/min (ref >60)
GFR calc non Af Amer: >60 mL/min (ref >60)
GFR calc non Af Amer: >60 mL/min (ref >60)
GFR calc non Af Amer: >60 mL/min (ref >60)
GFR calc non Af Amer: >60 mL/min (ref >60)
GFR calc non Af Amer: >60 mL/min (ref >60)
GFR calc non Af Amer: >60 mL/min (ref >60)
Glucose, Bld: 101 mg/dL — ABNORMAL HIGH (ref 70–99)
Glucose, Bld: 132 mg/dL — ABNORMAL HIGH (ref 70–99)
Glucose, Bld: 141 mg/dL — ABNORMAL HIGH (ref 70–99)
Glucose, Bld: 153 mg/dL — ABNORMAL HIGH (ref 70–99)
Glucose, Bld: 157 mg/dL — ABNORMAL HIGH (ref 70–99)
Potassium: 2.9 mEq/L — ABNORMAL LOW (ref 3.5–5.1)
Potassium: 3 mEq/L — ABNORMAL LOW (ref 3.5–5.1)
Potassium: 3.9 mEq/L (ref 3.5–5.1)
Potassium: 4 mEq/L (ref 3.5–5.1)
Potassium: 4.1 mEq/L (ref 3.5–5.1)
Potassium: 4.4 mEq/L (ref 3.5–5.1)
Sodium: 109 mEq/L — CL (ref 135–145)
Sodium: 117 mEq/L — CL (ref 135–145)
Sodium: 119 mEq/L — CL (ref 135–145)

## 2011-03-03 LAB — CBC
HCT: 33.5 % — ABNORMAL LOW (ref 36.0–46.0)
HCT: 37.8 % (ref 36.0–46.0)
Hemoglobin: 12.7 g/dL (ref 12.0–15.0)
MCHC: 33.5 g/dL (ref 30.0–36.0)
MCV: 94.1 fL (ref 78.0–100.0)
MCV: 94.1 fL (ref 78.0–100.0)
Platelets: 272 10*3/uL (ref 150–400)
Platelets: 304 10*3/uL (ref 150–400)
RBC: 4.02 MIL/uL (ref 3.87–5.11)
RDW: 12.8 % (ref 11.5–15.5)
WBC: 7.8 10*3/uL (ref 4.0–10.5)
WBC: 9.2 10*3/uL (ref 4.0–10.5)

## 2011-03-03 LAB — OSMOLALITY, URINE: Osmolality, Ur: 117 mOsm/kg — ABNORMAL LOW (ref 390–1090)

## 2011-03-03 LAB — SODIUM, URINE, RANDOM: Sodium, Ur: 17 mEq/L

## 2011-03-03 LAB — MAGNESIUM: Magnesium: 1.6 mg/dL (ref 1.5–2.5)

## 2011-03-03 LAB — CORTISOL: Cortisol, Plasma: 36.3 ug/dL

## 2012-01-02 ENCOUNTER — Encounter (INDEPENDENT_AMBULATORY_CARE_PROVIDER_SITE_OTHER): Payer: Self-pay | Admitting: General Surgery

## 2012-01-02 ENCOUNTER — Other Ambulatory Visit (INDEPENDENT_AMBULATORY_CARE_PROVIDER_SITE_OTHER): Payer: Self-pay | Admitting: General Surgery

## 2012-01-02 ENCOUNTER — Ambulatory Visit (INDEPENDENT_AMBULATORY_CARE_PROVIDER_SITE_OTHER): Payer: Medicare Other | Admitting: General Surgery

## 2012-01-02 ENCOUNTER — Other Ambulatory Visit (INDEPENDENT_AMBULATORY_CARE_PROVIDER_SITE_OTHER): Payer: Self-pay

## 2012-01-02 VITALS — BP 156/94 | HR 72 | Temp 97.8°F | Resp 16 | Ht 63.0 in | Wt 153.2 lb

## 2012-01-02 DIAGNOSIS — R1900 Intra-abdominal and pelvic swelling, mass and lump, unspecified site: Secondary | ICD-10-CM

## 2012-01-02 DIAGNOSIS — R1903 Right lower quadrant abdominal swelling, mass and lump: Secondary | ICD-10-CM

## 2012-01-02 NOTE — Progress Notes (Signed)
Patient ID: Natalie Rodgers, female   DOB: 04-Dec-1940, 71 y.o.   MRN: [id number]  Chief Complaint  Patient presents with  . Mass    Abdominal    HPI Natalie Rodgers is a 71 y.o. female.  Referred from a nursing home because of an abdominal mass HPI  Past Medical History  Diagnosis Date  . Abdominal mass     History reviewed. No pertinent past surgical history.  History reviewed. No pertinent family history.  Social History History  Substance Use Topics  . Smoking status: Not on file  . Smokeless tobacco: Never Used  . Alcohol Use: No    No Known Allergies  Current Outpatient Prescriptions  Medication Sig Dispense Refill  . amLODipine-benazepril (LOTREL) 5-20 MG per capsule Take 1 capsule by mouth daily.      Marland Kitchen aspirin 81 MG tablet Take 81 mg by mouth daily.      Marland Kitchen atorvastatin (LIPITOR) 20 MG tablet Take 20 mg by mouth daily.      . carvedilol (COREG) 12.5 MG tablet Take 12.5 mg by mouth 2 (two) times daily with a meal.      . Cholecalciferol (VITAMIN D3) 1000 UNITS CAPS Take by mouth daily.      Marland Kitchen donepezil (ARICEPT) 10 MG tablet Take 10 mg by mouth at bedtime as needed.      Marland Kitchen glimepiride (AMARYL) 2 MG tablet Take 2 mg by mouth daily before breakfast.      . hydrochlorothiazide (HYDRODIURIL) 25 MG tablet Take 25 mg by mouth daily.      . Melatonin 1 MG TABS Take by mouth at bedtime as needed.      . metFORMIN (GLUCOPHAGE) 1000 MG tablet Take 1,000 mg by mouth 2 (two) times daily with a meal.      . omeprazole (PRILOSEC) 40 MG capsule Take 40 mg by mouth daily.      . risperiDONE (RISPERDAL) 1 MG tablet Take 1 mg by mouth daily.      . traZODone (DESYREL) 50 MG tablet Take 50 mg by mouth at bedtime.        Review of Systems Review of Systems  Constitutional: Negative.   HENT: Negative.   Eyes: Negative.   Gastrointestinal: Positive for abdominal distention (long term\). Negative for blood in stool and rectal pain.    Blood pressure 156/94, pulse 72,  temperature 97.8 F (36.6 C), temperature source Temporal, resp. rate 16, height 5\' 3"  (1.6 m), weight 153 lb 4 oz (69.514 kg).  Physical Exam Physical Exam  Constitutional: She appears well-developed and well-nourished.  HENT:  Head: Normocephalic and atraumatic.  Abdominal: Soft. Normal appearance. She exhibits mass (huge right lower abdominal mass, mobile).    Neurological: She is alert. She is disoriented (Only oriented to place and person). She exhibits abnormal muscle tone (RUE contracted and weak.  ? prior stroke.). GCS eye subscore is 4. GCS verbal subscore is 5. GCS motor subscore is 6.  Psychiatric: She has a normal mood and affect. Her speech is normal and behavior is normal. Thought content normal. Cognition and memory are impaired. She expresses inappropriate judgment (questionable demetia). She exhibits abnormal recent memory.    Data Reviewed Labs only.  Not anemic  Assessment    Large abdominal mass in the right lower abdomen, It is firm and mobile, possibly malignancy    Plan    CT scan of the abdomen and pelvis with contrast.  Reassess in 3 weeks.  Gwenyth Ober 01/02/2012, 11:49 AM

## 2012-01-05 ENCOUNTER — Ambulatory Visit
Admission: RE | Admit: 2012-01-05 | Discharge: 2012-01-05 | Disposition: A | Discharge status: Home / Self Care | Payer: Medicare Other | Source: Ambulatory Visit | Attending: General Surgery | Admitting: General Surgery

## 2012-01-05 DIAGNOSIS — R1900 Intra-abdominal and pelvic swelling, mass and lump, unspecified site: Secondary | ICD-10-CM

## 2012-01-05 MED ORDER — IOHEXOL 300 MG/ML  SOLN
100.0000 mL | Freq: Once | INTRAMUSCULAR | Status: AC | PRN
  Administered 2012-01-05: 100 mL via INTRAVENOUS

## 2012-01-23 ENCOUNTER — Encounter (INDEPENDENT_AMBULATORY_CARE_PROVIDER_SITE_OTHER): Payer: Self-pay | Admitting: General Surgery

## 2012-01-23 ENCOUNTER — Ambulatory Visit (INDEPENDENT_AMBULATORY_CARE_PROVIDER_SITE_OTHER): Payer: Medicare Other | Admitting: General Surgery

## 2012-01-23 VITALS — BP 142/83 | HR 74 | Resp 16 | Ht 63.0 in | Wt 152.4 lb

## 2012-01-23 DIAGNOSIS — C569 Malignant neoplasm of unspecified ovary: Secondary | ICD-10-CM

## 2012-01-23 NOTE — Progress Notes (Signed)
The patient comes in today for followup of her large abdominal mass. The CT scan of her abdomen and pelvis reveals a large inhomogeneous mass of the pelvis and lower abdomen consistent with an advanced ovarian malignancy. She will need gynecologic oncology followup.  I talked with Dr. Ena Dawley about referral to GYN oncology. And on the waiting the telephone number to do so. No further general surgical intervention is necessary at this time.  I talked with the patient's caregiver Ms. Celesta Aver who will see to making sure that the patient gets her GYN oncology followup. We will try to make an appointment for her and pass them on to Ms. Celesta Aver this afternoon

## 2012-02-01 ENCOUNTER — Ambulatory Visit: Payer: Medicare Other | Attending: Gynecologic Oncology | Admitting: Gynecologic Oncology

## 2012-02-01 ENCOUNTER — Encounter: Payer: Self-pay | Admitting: Gynecologic Oncology

## 2012-02-01 VITALS — BP 162/80 | HR 86 | Temp 98.3°F | Resp 20 | Ht 62.21 in | Wt 152.1 lb

## 2012-02-01 DIAGNOSIS — Z79899 Other long term (current) drug therapy: Secondary | ICD-10-CM | POA: Insufficient documentation

## 2012-02-01 DIAGNOSIS — E119 Type 2 diabetes mellitus without complications: Secondary | ICD-10-CM | POA: Insufficient documentation

## 2012-02-01 DIAGNOSIS — R1909 Other intra-abdominal and pelvic swelling, mass and lump: Secondary | ICD-10-CM | POA: Insufficient documentation

## 2012-02-01 DIAGNOSIS — I10 Essential (primary) hypertension: Secondary | ICD-10-CM | POA: Insufficient documentation

## 2012-02-01 DIAGNOSIS — R1900 Intra-abdominal and pelvic swelling, mass and lump, unspecified site: Secondary | ICD-10-CM

## 2012-02-01 NOTE — Patient Instructions (Addendum)
exploratory laparotomy total abdominal hysterectomy bilateral salpingo-oophorectomy with staging as indicated by intraoperative pathology findings.   Preoperative clearance will be obtained by the MD at the residential facility.  Thank you very much Ms. Natalie Rodgers for allowing me to provide care for you today.  I appreciate your confidence in choosing our Gynecologic Oncology team.  If you have any questions about your visit today please call our office and we will get back to you as soon as possible.  Francetta Found. Issam Carlyon MD., PhD Gynecologic Oncology

## 2012-02-01 NOTE — Progress Notes (Signed)
Consult Note: Gyn-Onc  Consult was requested by Dr. Hulen Skains for the evaluation of Natalie Rodgers 71 y.o. female  CC:  Chief Complaint  Patient presents with  . Pelvic Mass    New Consult    HPI:  G32P3 [place] female referred for a pelvic mass.   Patient with increasing abdominal girth noticed by health care providers for 2 months.  Good apetite, no nausea or vomiting, no abdominal pain. No vaginal bleeding.  No diarrhea or constipation.  No weigh loss.     Patient seen by PCP and CT of the abdomen and pelvis was obtained. The study is  notable for a large inhomogeneous lobular mass with a maximum diameter of 13.1 x 13.9 x 21.4 cm. The mass is largely solid but this contains cystic components. The uterus is compressed anteriorly by the large mass as is the bladder. No fluid was noted within the pelvis. There was mild fullness of the right kidney due to compression of the distal right ureter by the large pelvic mass. A CA 125 returned to value of 83.   Current Meds:  Outpatient Encounter Prescriptions as of 02/01/2012  Medication Sig Dispense Refill  . amLODipine-benazepril (LOTREL) 5-20 MG per capsule Take 1 capsule by mouth daily.      Marland Kitchen aspirin 81 MG tablet Take 81 mg by mouth daily.      Marland Kitchen atorvastatin (LIPITOR) 20 MG tablet Take 20 mg by mouth daily.      . carvedilol (COREG) 12.5 MG tablet Take 12.5 mg by mouth 2 (two) times daily with a meal.      . Cholecalciferol (VITAMIN D3) 1000 UNITS CAPS Take by mouth daily.      Marland Kitchen donepezil (ARICEPT) 10 MG tablet Take 10 mg by mouth at bedtime as needed.      Marland Kitchen glimepiride (AMARYL) 2 MG tablet Take 2 mg by mouth daily before breakfast.      . hydrochlorothiazide (HYDRODIURIL) 25 MG tablet Take 25 mg by mouth daily.      . Melatonin 1 MG TABS Take by mouth at bedtime as needed.      . metFORMIN (GLUCOPHAGE) 1000 MG tablet Take 1,000 mg by mouth 2 (two) times daily with a meal.      . omeprazole (PRILOSEC) 40 MG capsule Take 40 mg by mouth  daily.      . risperiDONE (RISPERDAL) 1 MG tablet Take 1 mg by mouth daily.      . traZODone (DESYREL) 50 MG tablet Take 50 mg by mouth at bedtime.        Allergy: No Known Allergies  Social Hx:   History   Social History  . Marital Status: Divorced    Spouse Name: N/A    Number of Children: N/A  . Years of Education: N/A   Occupational History  . Not on file.   Social History Main Topics  . Smoking status: Never Smoker   . Smokeless tobacco: Never Used  . Alcohol Use: No  . Drug Use: No  . Sexually Active: No   Other Topics Concern  . Not on file   Social History Narrative  . No narrative on file  Patient is separated   Past Surgical Hx: History reviewed. No pertinent past surgical history.  Past Medical Hx:  Past Medical History  Diagnosis Date  . Abdominal mass   . Hypertension   . Diabetes mellitus   . Stroke   . Dementia since 2010    Past  Gynecological History:  G3P3 Menarche 14 regular menses until menopause in her 71's.  NSVD x 3.   No LMP recorded. Patient is postmenopausal. No abnormal pap tests. Mammogram 2012 Colonoscopy 2013 wnl.  Family Hx: History reviewed. No pertinent family history.  Review of Systems:  Constitutional  Feels well,Cardiovascular  No chest pain, shortness of breath, or edema  Pulmonary  No cough or wheeze.  Gastro Intestinal  No nausea, vomitting, or diarrhoea. No bright red blood per rectum, no abdominal pain, change in bowel movement, or constipation.  Genito Urinary  No frequency, urgency, dysuria, no vaginal bleeding or discharge Musculo Skeletal  No myalgia, arthralgia, joint swelling or pain  Neurologic  No weakness, numbness, change in gait,  Psychology  No depression, anxiety, reports insomnia.   Vitals:  Blood pressure 162/80, pulse 86, temperature 98.3 F (36.8 C), temperature source Oral, resp. rate 20, height 5' 2.21" (1.58 m), weight 152 lb 1.6 oz (68.992 kg).  Physical Exam: WD in NAD Neck    Supple NROM, without any enlargements.  Lymph Node Survey No cervical supraclavicular or inguinal adenopathy Cardiovascular  Pulse normal rate, regularity and rhythm. S1 and S2 normal.  Lungs  Clear to auscultation bilateraly, Good air movement.  Skin  No rash/lesions/breakdown  Psychiatry  Alert and oriented to person, and time.  Able to recall 2/3 items with significant prompting.  Responds to only very simple instructions. Abdomen  Normoactive bowel sounds, abdomen soft, non-tender and obese.  A very large firm mass is palpable just above the umbilicus. Back No CVA tenderness Genito Urinary  Vulva/vagina: Normal external female genitalia.  No lesions. No discharge or bleeding.  Bladder/urethra:  No lesions or masses  Vagina: Atrophic no discharge or bleeding  Cervix: Normal appearing, no lesions.  Uterus: Unable to assess size. However there is no parametrial involvement or nodularity.  Adnexa: No palpable masses. Rectal  Good tone, no masses mass is palpable on examination.  Extremities  No bilateral cyanosis, clubbing or edema.   Assessment/Plan:  Ms. Natalie Rodgers  is a 71 y.o.  year old with an elevated CA 125 and a large inhomogeneous lobular mass is largely solid and measures 21.4 cm in greatest dimension. At this visit the patient was accompanied by her caretaker from her assisted living facility and her daughter.  The patient appeared to follow the conversation and the stented to surgical exploration.  I was unable to determine whether or not the patient would accept chemotherapy if this was a malignancy, however her daughter indicated that the family would wish administration of chemotherapy. The planned procedure is that of an exploratory laparotomy total abdominal hysterectomy bilateral salpingo-oophorectomy with staging as indicated by intraoperative pathology findings.  Risks and benefits of the procedure were discussed with the patient her daughter and the  presented here from the nursing facility. Her postoperative course was also discussed.  Preoperative clearance will be obtained from the physician at the assisted living facility. The procedure is scheduled to occur on 02/20/2012 by Dr.Paola Gehrig.  Janie Morning, MD, PhD 02/01/2012, 1:41 PM

## 2012-02-13 ENCOUNTER — Encounter (HOSPITAL_COMMUNITY): Payer: Self-pay | Admitting: Pharmacy Technician

## 2012-02-16 ENCOUNTER — Ambulatory Visit (HOSPITAL_COMMUNITY)
Admission: RE | Admit: 2012-02-16 | Discharge: 2012-02-16 | Disposition: A | Discharge status: Home / Self Care | Payer: Medicare Other | Source: Ambulatory Visit | Attending: Obstetrics & Gynecology | Admitting: Obstetrics & Gynecology

## 2012-02-16 ENCOUNTER — Encounter (HOSPITAL_COMMUNITY): Payer: Self-pay

## 2012-02-16 ENCOUNTER — Encounter (HOSPITAL_COMMUNITY)
Admission: RE | Admit: 2012-02-16 | Discharge: 2012-02-16 | Disposition: A | Discharge status: Home / Self Care | Payer: Medicare Other | Source: Ambulatory Visit | Attending: Obstetrics & Gynecology | Admitting: Obstetrics & Gynecology

## 2012-02-16 DIAGNOSIS — I639 Cerebral infarction, unspecified: Secondary | ICD-10-CM

## 2012-02-16 DIAGNOSIS — K219 Gastro-esophageal reflux disease without esophagitis: Secondary | ICD-10-CM

## 2012-02-16 DIAGNOSIS — Z01810 Encounter for preprocedural cardiovascular examination: Secondary | ICD-10-CM | POA: Insufficient documentation

## 2012-02-16 DIAGNOSIS — R1909 Other intra-abdominal and pelvic swelling, mass and lump: Secondary | ICD-10-CM | POA: Insufficient documentation

## 2012-02-16 DIAGNOSIS — R1900 Intra-abdominal and pelvic swelling, mass and lump, unspecified site: Secondary | ICD-10-CM

## 2012-02-16 DIAGNOSIS — Z01812 Encounter for preprocedural laboratory examination: Secondary | ICD-10-CM | POA: Insufficient documentation

## 2012-02-16 HISTORY — DX: Gastro-esophageal reflux disease without esophagitis: K21.9

## 2012-02-16 HISTORY — DX: Cerebral infarction, unspecified: I63.9

## 2012-02-16 HISTORY — DX: Intra-abdominal and pelvic swelling, mass and lump, unspecified site: R19.00

## 2012-02-16 LAB — COMPREHENSIVE METABOLIC PANEL
ALT: 43 U/L — ABNORMAL HIGH (ref 0–35)
AST: 28 U/L (ref 0–37)
Alkaline Phosphatase: 74 U/L (ref 39–117)
CO2: 30 mEq/L (ref 19–32)
Calcium: 9.8 mg/dL (ref 8.4–10.5)
GFR calc Af Amer: >90 mL/min (ref >90)
Glucose, Bld: 110 mg/dL — ABNORMAL HIGH (ref 70–99)
Potassium: 3.8 mEq/L (ref 3.5–5.1)
Sodium: 137 mEq/L (ref 135–145)
Total Protein: 7.8 g/dL (ref 6.0–8.3)

## 2012-02-16 LAB — CBC WITH DIFFERENTIAL/PLATELET
Basophils Absolute: 0 10*3/uL (ref 0.0–0.1)
Eosinophils Absolute: 0.1 10*3/uL (ref 0.0–0.7)
Eosinophils Relative: 1 % (ref 0–5)
Lymphocytes Relative: 13 % (ref 12–46)
Lymphs Abs: 1 10*3/uL (ref 0.7–4.0)
Neutrophils Relative %: 78 % — ABNORMAL HIGH (ref 43–77)
Platelets: 373 10*3/uL (ref 150–400)
RBC: 4.44 MIL/uL (ref 3.87–5.11)
RDW: 13 % (ref 11.5–15.5)
WBC: 7.9 10*3/uL (ref 4.0–10.5)

## 2012-02-16 LAB — ABO/RH: ABO/RH(D): A POS

## 2012-02-16 NOTE — Patient Instructions (Addendum)
[place]  02/16/2012   Your procedure is scheduled on:   02-20-2012  Report to [facility] at   1200      PM.  Call this number if you have problems the morning of surgery: [phone]  Or Presurgical Testing [phone](Chelsa Stout)   Remember:   Do not eat solid food:After Midnight.02-18-12 Sunday night.  Monday 02-19-12 May have clear liquids:up to 6 Hours before arrival. Nothing after : 0800 AM on day of surgery 02-20-12  Clear liquids include soda, tea, black coffee, apple or grape juice, broth.-see list given  Take these medicines the morning of surgery with A SIP OF WATER: Carvedilol, Omeprazole.  Short Stay staff will give Amlodipine 5 mg on arrival to hospital. Take no Diabetic meds AM of surgery. Stop today Aspirin, Vitamin D, Melatonin.   Do not wear jewelry, make-up or nail polish.  Do not wear lotions, powders, or perfumes. You may wear deodorant.  Do not shave 48 hours prior to surgery.(face and neck okay, no shaving of legs)  Do not bring valuables to the hospital.  Contacts, dentures or bridgework may not be worn into surgery.  Leave suitcase in the car. After surgery it may be brought to your room.  For patients admitted to the hospital, checkout time is 11:00 AM the day of discharge.   Patients discharged the day of surgery will not be allowed to drive home. Must have responsible person with you x 24 hours once discharged.  Name and phone number of your driver: Christella Scheuermann [phone]  Special Instructions: CHG Shower Use Special Wash: 1/2 bottle night before surgery and 1/2 bottle morning of surgery.(avoid face and genitals)-see special instruction sheet.   Please read over the following fact sheets that you were given: MRSA Information, Blood Transfusion fact sheet, Incentive Spirometry Instruction.

## 2012-02-20 ENCOUNTER — Encounter (HOSPITAL_COMMUNITY): Payer: Self-pay | Admitting: Anesthesiology

## 2012-02-20 ENCOUNTER — Encounter (HOSPITAL_COMMUNITY)
Admission: RE | Disposition: A | Discharge status: Skilled Nursing Facility | Payer: Self-pay | Source: Ambulatory Visit | Attending: Obstetrics & Gynecology

## 2012-02-20 ENCOUNTER — Ambulatory Visit (HOSPITAL_COMMUNITY): Payer: Medicare Other | Admitting: Anesthesiology

## 2012-02-20 ENCOUNTER — Inpatient Hospital Stay (HOSPITAL_COMMUNITY)
Admission: RE | Admit: 2012-02-20 | Discharge: 2012-03-01 | DRG: 742 | Disposition: A | Discharge status: Skilled Nursing Facility | Payer: Medicare Other | Source: Ambulatory Visit | Attending: Obstetrics & Gynecology | Admitting: Obstetrics & Gynecology

## 2012-02-20 ENCOUNTER — Encounter (HOSPITAL_COMMUNITY): Payer: Self-pay | Admitting: *Deleted

## 2012-02-20 ENCOUNTER — Encounter (HOSPITAL_COMMUNITY): Payer: Self-pay | Admitting: Surgery

## 2012-02-20 DIAGNOSIS — I471 Supraventricular tachycardia, unspecified: Secondary | ICD-10-CM | POA: Diagnosis not present

## 2012-02-20 DIAGNOSIS — E119 Type 2 diabetes mellitus without complications: Secondary | ICD-10-CM | POA: Diagnosis present

## 2012-02-20 DIAGNOSIS — Y836 Removal of other organ (partial) (total) as the cause of abnormal reaction of the patient, or of later complication, without mention of misadventure at the time of the procedure: Secondary | ICD-10-CM | POA: Diagnosis present

## 2012-02-20 DIAGNOSIS — R188 Other ascites: Secondary | ICD-10-CM | POA: Diagnosis not present

## 2012-02-20 DIAGNOSIS — D279 Benign neoplasm of unspecified ovary: Principal | ICD-10-CM | POA: Diagnosis present

## 2012-02-20 DIAGNOSIS — I69998 Other sequelae following unspecified cerebrovascular disease: Secondary | ICD-10-CM

## 2012-02-20 DIAGNOSIS — Z7982 Long term (current) use of aspirin: Secondary | ICD-10-CM

## 2012-02-20 DIAGNOSIS — E8770 Fluid overload, unspecified: Secondary | ICD-10-CM

## 2012-02-20 DIAGNOSIS — F0390 Unspecified dementia without behavioral disturbance: Secondary | ICD-10-CM | POA: Diagnosis present

## 2012-02-20 DIAGNOSIS — R1903 Right lower quadrant abdominal swelling, mass and lump: Secondary | ICD-10-CM | POA: Diagnosis present

## 2012-02-20 DIAGNOSIS — K929 Disease of digestive system, unspecified: Secondary | ICD-10-CM | POA: Diagnosis present

## 2012-02-20 DIAGNOSIS — K219 Gastro-esophageal reflux disease without esophagitis: Secondary | ICD-10-CM | POA: Diagnosis present

## 2012-02-20 DIAGNOSIS — R971 Elevated cancer antigen 125 [CA 125]: Secondary | ICD-10-CM | POA: Diagnosis present

## 2012-02-20 DIAGNOSIS — Z79899 Other long term (current) drug therapy: Secondary | ICD-10-CM

## 2012-02-20 DIAGNOSIS — R5381 Other malaise: Secondary | ICD-10-CM | POA: Diagnosis present

## 2012-02-20 DIAGNOSIS — B964 Proteus (mirabilis) (morganii) as the cause of diseases classified elsewhere: Secondary | ICD-10-CM | POA: Diagnosis not present

## 2012-02-20 DIAGNOSIS — J9819 Other pulmonary collapse: Secondary | ICD-10-CM | POA: Diagnosis not present

## 2012-02-20 DIAGNOSIS — I491 Atrial premature depolarization: Secondary | ICD-10-CM | POA: Diagnosis present

## 2012-02-20 DIAGNOSIS — N390 Urinary tract infection, site not specified: Secondary | ICD-10-CM | POA: Diagnosis not present

## 2012-02-20 DIAGNOSIS — E871 Hypo-osmolality and hyponatremia: Secondary | ICD-10-CM | POA: Diagnosis not present

## 2012-02-20 DIAGNOSIS — E876 Hypokalemia: Secondary | ICD-10-CM | POA: Diagnosis not present

## 2012-02-20 DIAGNOSIS — K560 Paralytic ileus: Secondary | ICD-10-CM

## 2012-02-20 DIAGNOSIS — R339 Retention of urine, unspecified: Secondary | ICD-10-CM | POA: Diagnosis not present

## 2012-02-20 DIAGNOSIS — F209 Schizophrenia, unspecified: Secondary | ICD-10-CM | POA: Diagnosis present

## 2012-02-20 DIAGNOSIS — I10 Essential (primary) hypertension: Secondary | ICD-10-CM | POA: Diagnosis present

## 2012-02-20 DIAGNOSIS — R5383 Other fatigue: Secondary | ICD-10-CM | POA: Diagnosis present

## 2012-02-20 DIAGNOSIS — Z23 Encounter for immunization: Secondary | ICD-10-CM

## 2012-02-20 DIAGNOSIS — R0602 Shortness of breath: Secondary | ICD-10-CM | POA: Diagnosis not present

## 2012-02-20 HISTORY — PX: SALPINGOOPHORECTOMY: SHX82

## 2012-02-20 HISTORY — PX: LAPAROTOMY: SHX154

## 2012-02-20 HISTORY — PX: ABDOMINAL HYSTERECTOMY: SHX81

## 2012-02-20 LAB — GLUCOSE, CAPILLARY
Glucose-Capillary: 124 mg/dL — ABNORMAL HIGH (ref 70–99)
Glucose-Capillary: 221 mg/dL — ABNORMAL HIGH (ref 70–99)

## 2012-02-20 LAB — TYPE AND SCREEN

## 2012-02-20 SURGERY — LAPAROTOMY, EXPLORATORY
Anesthesia: General | Wound class: Clean Contaminated

## 2012-02-20 MED ORDER — CISATRACURIUM BESYLATE (PF) 10 MG/5ML IV SOLN
INTRAVENOUS | Status: DC | PRN
  Administered 2012-02-20: 2 mg via INTRAVENOUS
  Administered 2012-02-20: 8 mg via INTRAVENOUS
  Administered 2012-02-20: 2 mg via INTRAVENOUS

## 2012-02-20 MED ORDER — KETAMINE HCL 10 MG/ML IJ SOLN
INTRAMUSCULAR | Status: DC | PRN
  Administered 2012-02-20: 25 mg via INTRAVENOUS

## 2012-02-20 MED ORDER — AMLODIPINE BESY-BENAZEPRIL HCL 5-20 MG PO CAPS: 1.0000 | ORAL_CAPSULE | Freq: Every morning | ORAL | Status: DC

## 2012-02-20 MED ORDER — LACTATED RINGERS IV SOLN
INTRAVENOUS | Status: DC
  Administered 2012-02-20: 15:00:00 via INTRAVENOUS
  Administered 2012-02-20: 1000 mL via INTRAVENOUS

## 2012-02-20 MED ORDER — SUCCINYLCHOLINE CHLORIDE 20 MG/ML IJ SOLN
INTRAMUSCULAR | Status: DC | PRN
  Administered 2012-02-20: 80 mg via INTRAVENOUS

## 2012-02-20 MED ORDER — DIPHENHYDRAMINE HCL 50 MG/ML IJ SOLN: 12.5000 mg | Freq: Four times a day (QID) | INTRAMUSCULAR | Status: DC | PRN

## 2012-02-20 MED ORDER — INSULIN ASPART 100 UNIT/ML ~~LOC~~ SOLN
0.0000 [IU] | SUBCUTANEOUS | Status: DC
  Administered 2012-02-20: 5 [IU] via SUBCUTANEOUS
  Administered 2012-02-21: 3 [IU] via SUBCUTANEOUS
  Administered 2012-02-21: 2 [IU] via SUBCUTANEOUS
  Administered 2012-02-21: 3 [IU] via SUBCUTANEOUS

## 2012-02-20 MED ORDER — AMLODIPINE BESYLATE 5 MG PO TABS
5.0000 mg | ORAL_TABLET | Freq: Every day | ORAL | Status: DC
  Administered 2012-02-20: 5 mg via ORAL
  Filled 2012-02-20: qty 1

## 2012-02-20 MED ORDER — CARVEDILOL 12.5 MG PO TABS
12.5000 mg | ORAL_TABLET | Freq: Two times a day (BID) | ORAL | Status: DC
  Administered 2012-02-21 – 2012-02-24 (×7): 12.5 mg via ORAL
  Filled 2012-02-20 (×10): qty 1

## 2012-02-20 MED ORDER — DIPHENHYDRAMINE HCL 12.5 MG/5ML PO ELIX: 12.5000 mg | ORAL_SOLUTION | Freq: Four times a day (QID) | ORAL | Status: DC | PRN

## 2012-02-20 MED ORDER — NEOSTIGMINE METHYLSULFATE 1 MG/ML IJ SOLN
INTRAMUSCULAR | Status: DC | PRN
  Administered 2012-02-20: 4 mg via INTRAVENOUS
  Administered 2012-02-20: 1 mg via INTRAVENOUS

## 2012-02-20 MED ORDER — HYDROMORPHONE 0.3 MG/ML IV SOLN
INTRAVENOUS | Status: AC
  Administered 2012-02-20: 0.3 mg via INTRAVENOUS
  Filled 2012-02-20: qty 25

## 2012-02-20 MED ORDER — ONDANSETRON HCL 4 MG/2ML IJ SOLN
4.0000 mg | Freq: Four times a day (QID) | INTRAMUSCULAR | Status: DC | PRN
  Administered 2012-02-22 – 2012-02-23 (×2): 4 mg via INTRAVENOUS
  Filled 2012-02-20 (×2): qty 2

## 2012-02-20 MED ORDER — BENAZEPRIL HCL 20 MG PO TABS
20.0000 mg | ORAL_TABLET | Freq: Every day | ORAL | Status: DC
  Administered 2012-02-21 – 2012-02-24 (×4): 20 mg via ORAL
  Filled 2012-02-20 (×4): qty 1

## 2012-02-20 MED ORDER — KCL IN DEXTROSE-NACL 20-5-0.45 MEQ/L-%-% IV SOLN
INTRAVENOUS | Status: DC
  Administered 2012-02-20: 19:00:00 via INTRAVENOUS
  Filled 2012-02-20 (×3): qty 1000

## 2012-02-20 MED ORDER — 0.9 % SODIUM CHLORIDE (POUR BTL) OPTIME
TOPICAL | Status: DC | PRN
  Administered 2012-02-20: 1000 mL

## 2012-02-20 MED ORDER — GLYCOPYRROLATE 0.2 MG/ML IJ SOLN
INTRAMUSCULAR | Status: DC | PRN
  Administered 2012-02-20: .6 mg via INTRAVENOUS
  Administered 2012-02-20: .1 mg via INTRAVENOUS

## 2012-02-20 MED ORDER — ONDANSETRON HCL 4 MG PO TABS: 4.0000 mg | ORAL_TABLET | Freq: Four times a day (QID) | ORAL | Status: DC | PRN

## 2012-02-20 MED ORDER — CEFAZOLIN SODIUM-DEXTROSE 2-3 GM-% IV SOLR
2.0000 g | INTRAVENOUS | Status: AC
  Administered 2012-02-20: 2 g via INTRAVENOUS

## 2012-02-20 MED ORDER — SODIUM CHLORIDE 0.9 % IJ SOLN: 9.0000 mL | INTRAMUSCULAR | Status: DC | PRN

## 2012-02-20 MED ORDER — PANTOPRAZOLE SODIUM 40 MG PO TBEC
40.0000 mg | DELAYED_RELEASE_TABLET | Freq: Every day | ORAL | Status: DC
  Administered 2012-02-20 – 2012-02-23 (×4): 40 mg via ORAL
  Filled 2012-02-20 (×4): qty 1

## 2012-02-20 MED ORDER — PROMETHAZINE HCL 25 MG/ML IJ SOLN: 6.2500 mg | INTRAMUSCULAR | Status: DC | PRN

## 2012-02-20 MED ORDER — HYDROMORPHONE 0.3 MG/ML IV SOLN
INTRAVENOUS | Status: DC
  Administered 2012-02-20 – 2012-02-21 (×2): 0.3 mg via INTRAVENOUS

## 2012-02-20 MED ORDER — EPHEDRINE SULFATE 50 MG/ML IJ SOLN
INTRAMUSCULAR | Status: DC | PRN
  Administered 2012-02-20 (×2): 5 mg via INTRAVENOUS

## 2012-02-20 MED ORDER — HYDROMORPHONE HCL PF 1 MG/ML IJ SOLN: 0.2500 mg | INTRAMUSCULAR | Status: DC | PRN

## 2012-02-20 MED ORDER — NALOXONE HCL 0.4 MG/ML IJ SOLN: 0.4000 mg | INTRAMUSCULAR | Status: DC | PRN

## 2012-02-20 MED ORDER — PROPOFOL 10 MG/ML IV BOLUS
INTRAVENOUS | Status: DC | PRN
  Administered 2012-02-20: 120 mg via INTRAVENOUS

## 2012-02-20 MED ORDER — BUPIVACAINE LIPOSOME 1.3 % IJ SUSP
20.0000 mL | Freq: Once | INTRAMUSCULAR | Status: AC
  Administered 2012-02-20: 40 mL
  Filled 2012-02-20: qty 20

## 2012-02-20 MED ORDER — SUFENTANIL CITRATE 50 MCG/ML IV SOLN
INTRAVENOUS | Status: DC | PRN
  Administered 2012-02-20: 15 ug via INTRAVENOUS
  Administered 2012-02-20 (×2): 10 ug via INTRAVENOUS
  Administered 2012-02-20: 5 ug via INTRAVENOUS
  Administered 2012-02-20: 10 ug via INTRAVENOUS

## 2012-02-20 MED ORDER — OXYCODONE-ACETAMINOPHEN 5-325 MG PO TABS
1.0000 | ORAL_TABLET | ORAL | Status: DC | PRN
  Administered 2012-02-21: 2 via ORAL
  Administered 2012-02-21: 1 via ORAL
  Administered 2012-02-22: 2 via ORAL
  Filled 2012-02-20: qty 2
  Filled 2012-02-20: qty 1
  Filled 2012-02-20: qty 2

## 2012-02-20 MED ORDER — LIDOCAINE HCL (CARDIAC) 20 MG/ML IV SOLN
INTRAVENOUS | Status: DC | PRN
  Administered 2012-02-20: 60 mg via INTRAVENOUS

## 2012-02-20 MED ORDER — TRAZODONE HCL 50 MG PO TABS
50.0000 mg | ORAL_TABLET | Freq: Every day | ORAL | Status: DC
  Administered 2012-02-20 – 2012-02-29 (×10): 50 mg via ORAL
  Filled 2012-02-20 (×11): qty 1

## 2012-02-20 MED ORDER — HEPARIN SODIUM (PORCINE) 1000 UNIT/ML IJ SOLN
INTRAMUSCULAR | Status: AC
  Filled 2012-02-20: qty 1

## 2012-02-20 MED ORDER — ENOXAPARIN SODIUM 40 MG/0.4ML ~~LOC~~ SOLN
40.0000 mg | SUBCUTANEOUS | Status: AC
  Administered 2012-02-20: 40 mg via SUBCUTANEOUS
  Filled 2012-02-20: qty 0.4

## 2012-02-20 MED ORDER — RISPERIDONE 1 MG PO TABS
1.0000 mg | ORAL_TABLET | Freq: Every day | ORAL | Status: DC
  Administered 2012-02-20 – 2012-02-29 (×10): 1 mg via ORAL
  Filled 2012-02-20 (×11): qty 1

## 2012-02-20 MED ORDER — ACETAMINOPHEN 10 MG/ML IV SOLN
INTRAVENOUS | Status: AC
  Filled 2012-02-20: qty 100

## 2012-02-20 MED ORDER — ACETAMINOPHEN 10 MG/ML IV SOLN
1000.0000 mg | Freq: Four times a day (QID) | INTRAVENOUS | Status: AC
  Administered 2012-02-20 – 2012-02-21 (×3): 1000 mg via INTRAVENOUS
  Filled 2012-02-20 (×3): qty 100

## 2012-02-20 MED ORDER — ACETAMINOPHEN 10 MG/ML IV SOLN
INTRAVENOUS | Status: DC | PRN
  Administered 2012-02-20: 1000 mg via INTRAVENOUS

## 2012-02-20 MED ORDER — LABETALOL HCL 5 MG/ML IV SOLN
INTRAVENOUS | Status: DC | PRN
  Administered 2012-02-20: 5 mg via INTRAVENOUS

## 2012-02-20 MED ORDER — ONDANSETRON HCL 4 MG/2ML IJ SOLN: 4.0000 mg | Freq: Four times a day (QID) | INTRAMUSCULAR | Status: DC | PRN

## 2012-02-20 MED ORDER — AMLODIPINE BESYLATE 5 MG PO TABS
5.0000 mg | ORAL_TABLET | Freq: Every day | ORAL | Status: DC
  Administered 2012-02-21 – 2012-02-24 (×4): 5 mg via ORAL
  Filled 2012-02-20 (×4): qty 1

## 2012-02-20 MED ORDER — CEFAZOLIN SODIUM-DEXTROSE 2-3 GM-% IV SOLR
INTRAVENOUS | Status: AC
  Filled 2012-02-20: qty 50

## 2012-02-20 MED ORDER — ONDANSETRON HCL 4 MG/2ML IJ SOLN
INTRAMUSCULAR | Status: DC | PRN
  Administered 2012-02-20: 4 mg via INTRAVENOUS

## 2012-02-20 SURGICAL SUPPLY — 55 items
ATTRACTOMAT 16X20 MAGNETIC DRP (DRAPES) ×3 IMPLANT
BAG URINE DRAINAGE (UROLOGICAL SUPPLIES) ×2 IMPLANT
BLADE EXTENDED COATED 6.5IN (ELECTRODE) ×3 IMPLANT
CANISTER SUCTION 2500CC (MISCELLANEOUS) ×3 IMPLANT
CATH FOLEY 2WAY SLVR  5CC 16FR (CATHETERS)
CATH FOLEY 2WAY SLVR 5CC 16FR (CATHETERS) ×2 IMPLANT
CLIP TI MEDIUM LARGE 6 (CLIP) ×12 IMPLANT
CLOTH BEACON ORANGE TIMEOUT ST (SAFETY) ×3 IMPLANT
COVER SURGICAL LIGHT HANDLE (MISCELLANEOUS) ×3 IMPLANT
DRAPE TABLE BACK 44X90 PK DISP (DRAPES) ×2 IMPLANT
DRAPE UTILITY 15X26 (DRAPE) ×3 IMPLANT
DRAPE WARM FLUID 44X44 (DRAPE) ×3 IMPLANT
DRSG TELFA 4X14 ISLAND ADH (GAUZE/BANDAGES/DRESSINGS) ×1 IMPLANT
ELECT REM PT RETURN 9FT ADLT (ELECTROSURGICAL) ×3
ELECTRODE REM PT RTRN 9FT ADLT (ELECTROSURGICAL) ×2 IMPLANT
GAUZE SPONGE 4X4 16PLY XRAY LF (GAUZE/BANDAGES/DRESSINGS) ×2 IMPLANT
GLOVE BIO SURGEON STRL SZ 6.5 (GLOVE) ×3 IMPLANT
GLOVE BIO SURGEON STRL SZ7.5 (GLOVE) ×4 IMPLANT
GLOVE BIOGEL M STRL SZ7.5 (GLOVE) ×10 IMPLANT
GLOVE BIOGEL PI IND STRL 7.0 (GLOVE) ×2 IMPLANT
GLOVE BIOGEL PI INDICATOR 7.0 (GLOVE) ×1
GOWN PREVENTION PLUS XLARGE (GOWN DISPOSABLE) ×5 IMPLANT
GOWN STRL NON-REIN LRG LVL3 (GOWN DISPOSABLE) ×4 IMPLANT
GOWN STRL REIN XL XLG (GOWN DISPOSABLE) ×3 IMPLANT
HOLDER FOLEY CATH W/STRAP (MISCELLANEOUS) ×2 IMPLANT
NDL HYPO 25X1 1.5 SAFETY (NEEDLE) ×2 IMPLANT
NEEDLE HYPO 25X1 1.5 SAFETY (NEEDLE) IMPLANT
NS IRRIG 1000ML POUR BTL (IV SOLUTION) ×10 IMPLANT
PACK ABDOMINAL WL (CUSTOM PROCEDURE TRAY) ×3 IMPLANT
SHEET LAVH (DRAPES) ×3 IMPLANT
SPONGE LAP 18X18 X RAY DECT (DISPOSABLE) ×5 IMPLANT
STAPLER VISISTAT 35W (STAPLE) ×3 IMPLANT
SUT ETHILON 1 LR 30 (SUTURE) IMPLANT
SUT PDS AB 0 CT1 36 (SUTURE) ×2 IMPLANT
SUT PDS AB 0 CTX 60 (SUTURE) ×4 IMPLANT
SUT PDS AB 1 CTXB1 36 (SUTURE) ×6 IMPLANT
SUT SILK 2 0 (SUTURE)
SUT SILK 2 0 30  PSL (SUTURE)
SUT SILK 2 0 30 PSL (SUTURE) IMPLANT
SUT SILK 2-0 18XBRD TIE 12 (SUTURE) ×2 IMPLANT
SUT VIC AB 0 CT1 36 (SUTURE) ×26 IMPLANT
SUT VIC AB 2-0 CT2 27 (SUTURE) IMPLANT
SUT VIC AB 2-0 SH 27 (SUTURE)
SUT VIC AB 2-0 SH 27X BRD (SUTURE) ×4 IMPLANT
SUT VIC AB 3-0 CTX 36 (SUTURE) IMPLANT
SUT VIC AB 4-0 PS1 27 (SUTURE) ×4 IMPLANT
SUT VICRYL 0 TIES 12 18 (SUTURE) ×3 IMPLANT
SUT VICRYL 2 0 18  UND BR (SUTURE)
SUT VICRYL 2 0 18 UND BR (SUTURE) ×2 IMPLANT
SYR 3ML LL SCALE MARK (SYRINGE) ×1 IMPLANT
SYR CONTROL 10ML LL (SYRINGE) ×2 IMPLANT
TOWEL OR 17X26 10 PK STRL BLUE (TOWEL DISPOSABLE) ×3 IMPLANT
TOWEL OR NON WOVEN STRL DISP B (DISPOSABLE) ×3 IMPLANT
TRAY FOLEY CATH 14FRSI W/METER (CATHETERS) ×3 IMPLANT
WATER STERILE IRR 1500ML POUR (IV SOLUTION) ×3 IMPLANT

## 2012-02-20 NOTE — Anesthesia Procedure Notes (Addendum)
Procedure Name: Intubation Date/Time: 02/20/2012 2:16 PM Performed by: Danley Danker L Patient Re-evaluated:Patient Re-evaluated prior to inductionOxygen Delivery Method: Circle system utilized Preoxygenation: Pre-oxygenation with 100% oxygen Intubation Type: IV induction Ventilation: Mask ventilation without difficulty and Oral airway inserted - appropriate to patient size Laryngoscope Size: Miller and 3 Grade View: Grade II Tube type: Oral Tube size: 7.0 mm Number of attempts: 1 Airway Equipment and Method: Stylet Placement Confirmation: ETT inserted through vocal cords under direct vision,  breath sounds checked- equal and bilateral and positive ETCO2 Secured at: 21 cm Tube secured with: Tape Dental Injury: Teeth and Oropharynx as per pre-operative assessment and Injury to lip

## 2012-02-20 NOTE — Transfer of Care (Signed)
Immediate Anesthesia Transfer of Care Note  Patient: Natalie Rodgers  Procedure(s) Performed: Procedure(s) (LRB) with comments: EXPLORATORY LAPAROTOMY (N/A) HYSTERECTOMY ABDOMINAL (N/A) SALPINGO OOPHERECTOMY (Bilateral)  Patient Location: PACU  Anesthesia Type: General  Level of Consciousness: awake and alert   Airway & Oxygen Therapy: Patient Spontanous Breathing and Patient connected to face mask oxygen  Post-op Assessment: Report given to PACU RN and Post -op Vital signs reviewed and stable  Post vital signs: Reviewed and stable  Complications: No apparent anesthesia complications

## 2012-02-20 NOTE — Anesthesia Preprocedure Evaluation (Addendum)
Anesthesia Evaluation  Patient identified by MRN, date of birth, ID band Patient awake    Reviewed: Allergy & Precautions, H&P , NPO status , Patient's Chart, lab work & pertinent test results  Airway Mallampati: II TM Distance: >3 FB Neck ROM: Full    Dental No notable dental hx.    Pulmonary neg pulmonary ROS,  breath sounds clear to auscultation  Pulmonary exam normal       Cardiovascular hypertension, Pt. on medications and Pt. on home beta blockers Rhythm:Regular Rate:Normal     Neuro/Psych PSYCHIATRIC DISORDERS CVA    GI/Hepatic Neg liver ROS, GERD-  Medicated,  Endo/Other  negative endocrine ROSdiabetes, Type 2, Oral Hypoglycemic Agents  Renal/GU negative Renal ROS  negative genitourinary   Musculoskeletal negative musculoskeletal ROS (+)   Abdominal   Peds negative pediatric ROS (+)  Hematology negative hematology ROS (+)   Anesthesia Other Findings   Reproductive/Obstetrics negative OB ROS                           Anesthesia Physical Anesthesia Plan  ASA: III  Anesthesia Plan: General   Post-op Pain Management:    Induction: Intravenous  Airway Management Planned: Oral ETT  Additional Equipment:   Intra-op Plan:   Post-operative Plan: Extubation in OR  Informed Consent: I have reviewed the patients History and Physical, chart, labs and discussed the procedure including the risks, benefits and alternatives for the proposed anesthesia with the patient or authorized representative who has indicated his/her understanding and acceptance.   Dental advisory given  Plan Discussed with: CRNA  Anesthesia Plan Comments: (Discussed r/b general anesthesia with patient and patient's son through interpreter on phone. [employer] contract interpreter.) Questions answered.)       Anesthesia Quick Evaluation

## 2012-02-20 NOTE — Anesthesia Postprocedure Evaluation (Signed)
  Anesthesia Post-op Note  Patient: Natalie Rodgers  Procedure(s) Performed: Procedure(s) (LRB): EXPLORATORY LAPAROTOMY (N/A) HYSTERECTOMY ABDOMINAL (N/A) SALPINGO OOPHERECTOMY (Bilateral)  Patient Location: PACU  Anesthesia Type: General  Level of Consciousness: awake and alert   Airway and Oxygen Therapy: Patient Spontanous Breathing  Post-op Pain: mild  Post-op Assessment: Post-op Vital signs reviewed, Patient's Cardiovascular Status Stable, Respiratory Function Stable, Patent Airway and No signs of Nausea or vomiting  Post-op Vital Signs: stable  Complications: No apparent anesthesia complications.

## 2012-02-20 NOTE — H&P (View-Only) (Signed)
Consult Note: Gyn-Onc  Consult was requested by Dr. Hulen Skains for the evaluation of Natalie Rodgers 71 y.o. female  CC:  Chief Complaint  Patient presents with  . Pelvic Mass    New Consult    HPI:  G59P3 [place] female referred for a pelvic mass.   Patient with increasing abdominal girth noticed by health care providers for 2 months.  Good apetite, no nausea or vomiting, no abdominal pain. No vaginal bleeding.  No diarrhea or constipation.  No weigh loss.     Patient seen by PCP and CT of the abdomen and pelvis was obtained. The study is  notable for a large inhomogeneous lobular mass with a maximum diameter of 13.1 x 13.9 x 21.4 cm. The mass is largely solid but this contains cystic components. The uterus is compressed anteriorly by the large mass as is the bladder. No fluid was noted within the pelvis. There was mild fullness of the right kidney due to compression of the distal right ureter by the large pelvic mass. A CA 125 returned to value of 83.   Current Meds:  Outpatient Encounter Prescriptions as of 02/01/2012  Medication Sig Dispense Refill  . amLODipine-benazepril (LOTREL) 5-20 MG per capsule Take 1 capsule by mouth daily.      Marland Kitchen aspirin 81 MG tablet Take 81 mg by mouth daily.      Marland Kitchen atorvastatin (LIPITOR) 20 MG tablet Take 20 mg by mouth daily.      . carvedilol (COREG) 12.5 MG tablet Take 12.5 mg by mouth 2 (two) times daily with a meal.      . Cholecalciferol (VITAMIN D3) 1000 UNITS CAPS Take by mouth daily.      Marland Kitchen donepezil (ARICEPT) 10 MG tablet Take 10 mg by mouth at bedtime as needed.      Marland Kitchen glimepiride (AMARYL) 2 MG tablet Take 2 mg by mouth daily before breakfast.      . hydrochlorothiazide (HYDRODIURIL) 25 MG tablet Take 25 mg by mouth daily.      . Melatonin 1 MG TABS Take by mouth at bedtime as needed.      . metFORMIN (GLUCOPHAGE) 1000 MG tablet Take 1,000 mg by mouth 2 (two) times daily with a meal.      . omeprazole (PRILOSEC) 40 MG capsule Take 40 mg by mouth  daily.      . risperiDONE (RISPERDAL) 1 MG tablet Take 1 mg by mouth daily.      . traZODone (DESYREL) 50 MG tablet Take 50 mg by mouth at bedtime.        Allergy: No Known Allergies  Social Hx:   History   Social History  . Marital Status: Divorced    Spouse Name: N/A    Number of Children: N/A  . Years of Education: N/A   Occupational History  . Not on file.   Social History Main Topics  . Smoking status: Never Smoker   . Smokeless tobacco: Never Used  . Alcohol Use: No  . Drug Use: No  . Sexually Active: No   Other Topics Concern  . Not on file   Social History Narrative  . No narrative on file  Patient is separated   Past Surgical Hx: History reviewed. No pertinent past surgical history.  Past Medical Hx:  Past Medical History  Diagnosis Date  . Abdominal mass   . Hypertension   . Diabetes mellitus   . Stroke   . Dementia since 2010    Past  Gynecological History:  G3P3 Menarche 14 regular menses until menopause in her 66's.  NSVD x 3.   No LMP recorded. Patient is postmenopausal. No abnormal pap tests. Mammogram 2012 Colonoscopy 2013 wnl.  Family Hx: History reviewed. No pertinent family history.  Review of Systems:  Constitutional  Feels well,Cardiovascular  No chest pain, shortness of breath, or edema  Pulmonary  No cough or wheeze.  Gastro Intestinal  No nausea, vomitting, or diarrhoea. No bright red blood per rectum, no abdominal pain, change in bowel movement, or constipation.  Genito Urinary  No frequency, urgency, dysuria, no vaginal bleeding or discharge Musculo Skeletal  No myalgia, arthralgia, joint swelling or pain  Neurologic  No weakness, numbness, change in gait,  Psychology  No depression, anxiety, reports insomnia.   Vitals:  Blood pressure 162/80, pulse 86, temperature 98.3 F (36.8 C), temperature source Oral, resp. rate 20, height 5' 2.21" (1.58 m), weight 152 lb 1.6 oz (68.992 kg).  Physical Exam: WD in NAD Neck    Supple NROM, without any enlargements.  Lymph Node Survey No cervical supraclavicular or inguinal adenopathy Cardiovascular  Pulse normal rate, regularity and rhythm. S1 and S2 normal.  Lungs  Clear to auscultation bilateraly, Good air movement.  Skin  No rash/lesions/breakdown  Psychiatry  Alert and oriented to person, and time.  Able to recall 2/3 items with significant prompting.  Responds to only very simple instructions. Abdomen  Normoactive bowel sounds, abdomen soft, non-tender and obese.  A very large firm mass is palpable just above the umbilicus. Back No CVA tenderness Genito Urinary  Vulva/vagina: Normal external female genitalia.  No lesions. No discharge or bleeding.  Bladder/urethra:  No lesions or masses  Vagina: Atrophic no discharge or bleeding  Cervix: Normal appearing, no lesions.  Uterus: Unable to assess size. However there is no parametrial involvement or nodularity.  Adnexa: No palpable masses. Rectal  Good tone, no masses mass is palpable on examination.  Extremities  No bilateral cyanosis, clubbing or edema.   Assessment/Plan:  Ms. Natalie Rodgers  is a 70 y.o.  year old with an elevated CA 125 and a large inhomogeneous lobular mass is largely solid and measures 21.4 cm in greatest dimension. At this visit the patient was accompanied by her caretaker from her assisted living facility and her daughter.  The patient appeared to follow the conversation and the stented to surgical exploration.  I was unable to determine whether or not the patient would accept chemotherapy if this was a malignancy, however her daughter indicated that the family would wish administration of chemotherapy. The planned procedure is that of an exploratory laparotomy total abdominal hysterectomy bilateral salpingo-oophorectomy with staging as indicated by intraoperative pathology findings.  Risks and benefits of the procedure were discussed with the patient her daughter and the  presented here from the nursing facility. Her postoperative course was also discussed.  Preoperative clearance will be obtained from the physician at the assisted living facility. The procedure is scheduled to occur on 02/20/2012 by Dr.Paola Gehrig.  Janie Morning, MD, PhD 02/01/2012, 1:41 PM

## 2012-02-20 NOTE — Op Note (Signed)
PATIENT: Natalie Rodgers DATE OF BIRTH: 06-Oct-1940 ENCOUNTER DATE: 02/20/2012   Preop Diagnosis: Pelvic mass, elevated CA-125  Postoperative Diagnosis: Bilateral ovarian cystadenofibromas  Surgery: Total abdominal hysterectomy,  bilateral salpingo-oophorectomy  Surgeons:  Imagene Gurney A. Alycia Rossetti, MD; Lahoma Crocker, MD   Assistant: Caswell Corwin   Anesthesia: General   Estimated blood loss: 150 ml   IVF: 1400 ml   Urine output: 0000000 ml   Complications: None   Pathology: Uterus, cervix, bilateral tubes and ovaries  Operative findings: 18 cm right adnexal mass that was cystic and solid. There were numerous areas of very small cysts as well as one dominant cyst on the right ovary. The left ovary come pain to similar appearing mass measuring approximately 8 cm. The uterus was otherwise normal. Abdominal survey was negative. Frozen section of both masses revealed bilateral ovarian cyst adenofibroma is.  Procedure: The patient was identified in the preoperative holding area. Informed consent was signed on the chart. Patient was seen history was reviewed and exam was performed. Interpreter services were provided by [employer].  The patient was then taken to the operating room and placed in the supine position with SCD hose on. General anesthesia was then induced without difficulty. She was then placed in the dorsolithotomy position. Her right arms was tucked at her side with appropriate precautions on the gel pad due to contractures from her prior CVA.  port was then converted to a 10/12 port under direct visualization.  After assuring adequate visualization, the robot was then docked in the usual fashion. Under direct visualization the robotic instruments replaced.   The perineum was prepped in the usual fashion. Foley catheter was inserted into the bladder under sterile conditions. The abdomen was prepped with 2 chlor prep sponges. After the prep was dried she was then draped.  Timeout was performed to confirm the procedure the patient antibiotic allergy status. She received 2 g of IV Ancef. A vertical midline incision was made with a knife and carried down to the underlying fashion using Bovie cautery. The fascia was identified and scored in the midline. The fascial incision was extended superiorly and inferiorly using the Bovie. The peritoneal cavity was entered. Abdominal survey as above was identified. There were no nodularities noted on the peritoneal surfaces. The Buchwalter self-retaining retractor was then placed in the usual fashion. Precautions were taken to ensure that there is no significant pressure with the lateral blades on the psoas muscles.  The ovarian mass on the patient's right was identified. A window was made between the utero-ovarian in the infundibulopelvic vessels. The utero-ovarian was clamped x2 transected and suture ligated was therefore. The ovarian pedicle was then taken in a similar fashion with 2 curved Kelly clamps transected and suture ligated. This was done well superior to the ureter. The mass was then delivered to the abdominal incision and sent for frozen section.  The small and large bowel were packed with moist laparotomy sponges. There is approximately 80 cc of serosanguineous ascitic fluid. This was then drawn off and held pending frozen section. Our attention was then drawn to the patient's left side. The round ligament was transected with monopolar cautery and anterior posterior leaves the broad ligament were opened. The ureter was identified. A window was made between the IP and the ureter. The IP was clamped x2 and suture ligated. The utero-ovarian was similarly clamped. This allowed removal of the left ovarian mass that was held pending frozen section results. The uterine vessels were then skeletonized after the  bladder flap was created. The uterine vessels were clamped with curved Masterson clamp transected and suture ligated. On the  patient's right side the round was taken using monopolar cautery the bladder flap was clamped cut completed anteriorly and a posterior leaf of the broad ligament was opened. The ureter was identified. The IP ligament was then clamped transected and suture ligated. There was a small hematoma measuring approximately 3 cm which developed. We further skeletonized the IP and the ureter. A separate suture ligature was placed for hemostasis on the ovarian vessels. The uterine vessels were then skeletonized, clamped with curved Masterson clamps, transected and suture ligated. We continued down the cardinal ligaments using successive clamping of the Masterson's with suture ligature. We came across the cervicovaginal junction with curved Masterson the cervix was transected from the vagina. The vaginal cuff was closed using interrupted sutures in a figure-of-eight fashion of 0 Vicryl. At this point both ovarian mass returned with benign serous cystadenofibromas. The abdomen was copiously irrigated and all pedicles were noted to be hemostatic.  The Market researcher removed. The laparotomy sponges were removed and the first count was correct. The remainder of the retractor blades were removed. The fascia was closed in a running mass fashion with #1 PDS x 2. The subcutaneous tissues were irrigated and the hemostatic. Expert L. 20 mL diluted in 20 mls of normal saline was used for postoperative pain control. The skin was closed using staples.  The patient tolerated the procedure well and was taken to the recovery room in stable condition. All instrument needle laparotomy sponge counts were correct x2.  This is [facility] dictating an operative note on patient Natalie Rodgers.

## 2012-02-20 NOTE — Interval H&P Note (Signed)
History and Physical Interval Note:  02/20/2012 1:53 PM  Natalie Rodgers  has presented today for surgery, with the diagnosis of pelvic mass  The various methods of treatment have been discussed with the patient and family. After consideration of risks, benefits and other options for treatment, the patient has consented to  Procedure(s) (LRB) with comments: EXPLORATORY LAPAROTOMY (N/A) HYSTERECTOMY ABDOMINAL (N/A) SALPINGO OOPHERECTOMY (Bilateral) - Possible Staging as a surgical intervention .  The patient's history has been reviewed, patient examined, no change in status, stable for surgery.  I have reviewed the patient's chart and labs.  Questions were answered to the patient's satisfaction.  We need to use [place] interpreter services as the patient's command of the [place] language was not sufficient for consent.  Her questions were answered. We may speak with her son when the procedure is completed.   Rhine A.

## 2012-02-20 NOTE — Progress Notes (Signed)
Paged MD Delsa Sale concerning patient's incision site is currently saturating the dressing, awaiting the callback Means, Veronda Prude RN 02-20-2012 20:00pm

## 2012-02-21 ENCOUNTER — Encounter (HOSPITAL_COMMUNITY): Payer: Self-pay | Admitting: Gynecologic Oncology

## 2012-02-21 LAB — URINALYSIS, ROUTINE W REFLEX MICROSCOPIC
Bilirubin Urine: NEGATIVE
Glucose, UA: NEGATIVE mg/dL
Hgb urine dipstick: NEGATIVE
Ketones, ur: NEGATIVE mg/dL
Protein, ur: NEGATIVE mg/dL

## 2012-02-21 LAB — CBC
HCT: 28.4 % — ABNORMAL LOW (ref 36.0–46.0)
Hemoglobin: 10.1 g/dL — ABNORMAL LOW (ref 12.0–15.0)
MCH: 30.3 pg (ref 26.0–34.0)
MCHC: 35.6 g/dL (ref 30.0–36.0)
RDW: 12.6 % (ref 11.5–15.5)

## 2012-02-21 LAB — BASIC METABOLIC PANEL
BUN: 10 mg/dL (ref 6–23)
CO2: 29 mEq/L (ref 19–32)
Calcium: 8.1 mg/dL — ABNORMAL LOW (ref 8.4–10.5)
Calcium: 8.4 mg/dL (ref 8.4–10.5)
Creatinine, Ser: 0.66 mg/dL (ref 0.50–1.10)
GFR calc Af Amer: >90 mL/min (ref >90)
GFR calc non Af Amer: 86 mL/min — ABNORMAL LOW (ref >90)
GFR calc non Af Amer: 87 mL/min — ABNORMAL LOW (ref >90)
Glucose, Bld: 200 mg/dL — ABNORMAL HIGH (ref 70–99)
Glucose, Bld: 160 mg/dL — ABNORMAL HIGH (ref 70–99)
Sodium: 128 mEq/L — ABNORMAL LOW (ref 135–145)

## 2012-02-21 LAB — GLUCOSE, CAPILLARY: Glucose-Capillary: 136 mg/dL — ABNORMAL HIGH (ref 70–99)

## 2012-02-21 MED ORDER — POTASSIUM CHLORIDE CRYS ER 10 MEQ PO TBCR
10.0000 meq | EXTENDED_RELEASE_TABLET | Freq: Two times a day (BID) | ORAL | Status: DC
  Administered 2012-02-21 – 2012-02-22 (×2): 10 meq via ORAL
  Filled 2012-02-21 (×3): qty 1

## 2012-02-21 MED ORDER — SODIUM CHLORIDE 0.9 % IV SOLN
INTRAVENOUS | Status: DC
  Administered 2012-02-21: 75 mL/h via INTRAVENOUS
  Administered 2012-02-21: 10:00:00 via INTRAVENOUS

## 2012-02-21 MED ORDER — INSULIN ASPART 100 UNIT/ML ~~LOC~~ SOLN: 0.0000 [IU] | Freq: Every day | SUBCUTANEOUS | Status: DC

## 2012-02-21 MED ORDER — METFORMIN HCL 500 MG PO TABS
1000.0000 mg | ORAL_TABLET | Freq: Two times a day (BID) | ORAL | Status: DC
  Administered 2012-02-21 – 2012-02-22 (×3): 1000 mg via ORAL
  Filled 2012-02-21 (×4): qty 2

## 2012-02-21 MED ORDER — GLIMEPIRIDE 2 MG PO TABS
2.0000 mg | ORAL_TABLET | Freq: Every day | ORAL | Status: DC
  Administered 2012-02-22: 2 mg via ORAL
  Filled 2012-02-21 (×2): qty 1

## 2012-02-21 MED ORDER — INSULIN ASPART 100 UNIT/ML ~~LOC~~ SOLN
0.0000 [IU] | Freq: Three times a day (TID) | SUBCUTANEOUS | Status: DC
  Administered 2012-02-22 (×2): 3 [IU] via SUBCUTANEOUS
  Administered 2012-02-22: 2 [IU] via SUBCUTANEOUS

## 2012-02-21 MED ORDER — HYDROCHLOROTHIAZIDE 25 MG PO TABS
25.0000 mg | ORAL_TABLET | Freq: Every day | ORAL | Status: DC
  Filled 2012-02-21: qty 1

## 2012-02-21 NOTE — Progress Notes (Signed)
Patient voided 300. Bladder scanned showed 700 residual. cathed and received 600. Lachlan Pelto RN

## 2012-02-21 NOTE — Progress Notes (Signed)
Clinical Social Work Department BRIEF PSYCHOSOCIAL ASSESSMENT 02/21/2012  Patient:  TYSHIKA, GUIDRY     Account Number:  [id number]     Admit date:  02/20/2012  Clinical Social Worker:  Lacie Scotts  Date/Time:  02/21/2012 02:22 PM  Referred by:  Care Management  Date Referred:  02/21/2012 Referred for  SNF Placement   Other Referral:   Interview type:  Family Other interview type:    PSYCHOSOCIAL DATA Living Status:  FACILITY Admitted from facility:  [facility] Level of care:   Primary support name:  Jenell Milliner Primary support relationship to patient:  CHILD, ADULT Degree of support available:   supportive    CURRENT CONCERNS Current Concerns  Post-Acute Placement   Other Concerns:    SOCIAL WORK ASSESSMENT / PLAN Pt is a 71 yr old female admitted from [facility]. CSW spoke with pt's daughter to assist with d/c planning Verdis Frederickson [phone] ). Daughter feels pt needs more assistance than ALF is able to provide. Pt may benefit from [facility] following hospital d/c. PT eval requested. SNF search initiated with daughter's permission incase rehab is needed. Bed offers will be provided as received.   Assessment/plan status:  Psychosocial Support/Ongoing Assessment of Needs Other assessment/ plan:   Information/referral to community resources:   None at this time. If SNF is recommended SNF list will be provided to family.    PATIENT'S/FAMILY'S RESPONSE TO PLAN OF CARE: Daughter feels pt needs more assistance than is available at ALF and is interested in rehab following hospital d/c.   Werner Lean LCSW [phone]

## 2012-02-21 NOTE — Progress Notes (Signed)
Foley removed at 9 am patient due to void at 3pm will monitor. Dominic Rhome RN

## 2012-02-21 NOTE — Progress Notes (Signed)
1 Day Post-Op Procedure(s) (LRB): EXPLORATORY LAPAROTOMY (N/A) HYSTERECTOMY ABDOMINAL (N/A) SALPINGO OOPHERECTOMY (Bilateral)  Subjective: Patient reports incisional pain with movement.  Pt responding "I can't" when asked to ambulate.  Refusing to sit in the rocking chair in the room.  Stating that after her stroke, she "moved little by little."  Denies nausea, vomiting, or passing flatus.   Objective: Vital signs in last 24 hours: Temp:  [97.4 F (36.3 C)-98.8 F (37.1 C)] 97.7 F (36.5 C) (09/25 1454) Pulse Rate:  [71-85] 79  (09/25 1454) Resp:  [13-21] 18  (09/25 1454) BP: (103-161)/(55-92) 161/67 mmHg (09/25 1454) SpO2:  [96 %-100 %] 98 % (09/25 1454) Weight:  [147 lb 4.3 oz (66.8 kg)] 147 lb 4.3 oz (66.8 kg) (09/24 1742) Last BM Date: 02/19/12  Intake/Output from previous day: 09/24 0701 - 09/25 0700 In: 3497.5 [P.O.:60; I.V.:3137.5; IV Piggyback:300] Out: 1750 [Urine:1600; Blood:150]  Physical Examination: General: alert, cooperative and no distress Resp: clear to auscultation bilaterally Cardio: regularly irregular rhythm GI: soft, non-tender; bowel sounds normal; no masses,  no organomegaly, incision: midline with staples, no drainage, area with blood clot from earlier assessment dry and intact  and abdomen mildly distended and tympanic on percussion Extremities: extremities normal, atraumatic, no cyanosis or edema Musculoskeletal:  Right sided weakness due to stroke in the past, able to ambulate short distance with assist, grip strength and movement against resistance decreased on the right side    Labs: WBC/Hgb/Hct/Plts:  11.2/10.1/28.4/280 (09/25 0830) BUN/Cr/glu/ALT/AST/amyl/lip:  10/0.63/--/--/--/--/-- (09/25 1335)  Assessment: 71 y.o. s/p Procedure(s): EXPLORATORY LAPAROTOMY HYSTERECTOMY ABDOMINAL SALPINGO OOPHERECTOMY: stable Pain:  Pain is well-controlled on oral medications.  CV: Hypertension: Stable.  Current treatment:  amlodipine (Norvasc), benazepril  (Lotensin), hydrochlorothiazide (HCTZ) and Coreg.  GI:  Tolerating po: Yes     FEN: Mild hyponatremia post operatively:  Repeat Bmet at 14:00 with Na+ 129.  Endo: Diabetes mellitus Type II, under good control..  CBG:  CBG (last 3)   Basename 02/21/12 1230 02/21/12 0801 02/20/12 2009  GLUCAP 150* 164* 221*   Prophylaxis: intermittent pneumatic compression boots.  Plan: Encourage ambulation PT consult for evaluation and assistance with ambulation Social work consult for discharge needs and possible rehab placement Potassium 10 meq PO BID Stop hydrochlorothiazide Plan to restart oral diabetic medications Repeat Bmet in the am Encourage IS use, deep breathing, and coughing   LOS: 1 day    Dominiq Fontaine DEAL 02/21/2012, 3:36 PM

## 2012-02-21 NOTE — Progress Notes (Signed)
Clinical Social Work Department CLINICAL SOCIAL WORK PLACEMENT NOTE 02/21/2012  Patient:  MATALYNN, CLAES  Account Number:  [id number] Admit date:  02/20/2012  Clinical Social Worker:  Werner Lean, LCSW  Date/time:  02/21/2012 02:31 PM  Clinical Social Work is seeking post-discharge placement for this patient at the following level of care:   SKILLED NURSING   (*CSW will update this form in Epic as items are completed)   02/21/2012  Patient/family provided with [facility] Department of Clinical Social Work's list of facilities offering this level of care within the geographic area requested by the patient (or if unable, by the patient's family).  02/21/2012  Patient/family informed of their freedom to choose among providers that offer the needed level of care, that participate in Medicare, Medicaid or managed care program needed by the patient, have an available bed and are willing to accept the patient.    Patient/family informed of [facility]' ownership interest in [facility], as well as of the fact that they are under no obligation to receive care at this facility.  PASARR submitted to EDS on 02/21/2012 PASARR number received from EDS on 02/21/2012  FL2 transmitted to all facilities in geographic area requested by pt/family on  02/21/2012 FL2 transmitted to all facilities within larger geographic area on   Patient informed that his/her managed care company has contracts with or will negotiate with  certain facilities, including the following:     Patient/family informed of bed offers received:   Patient chooses bed at  Physician recommends and patient chooses bed at    Patient to be transferred to  on   Patient to be transferred to facility by   The following physician request were entered in Epic:   Additional Comments:  Werner Lean LCSW [phone]

## 2012-02-21 NOTE — Progress Notes (Signed)
Social work called regarding daughter concern of patient discharge back to nursing facility stated patient may benefit from rehab before going home. Requested PT consult for patient d/t being weak and not wanting to move. Will contact Dr. Delsa Sale or [facility] regarding further instruction on this issue. Denessa Cavan RN

## 2012-02-21 NOTE — Progress Notes (Signed)
1 Day Post-Op Procedure(s) (LRB): EXPLORATORY LAPAROTOMY (N/A) HYSTERECTOMY ABDOMINAL (N/A) SALPINGO OOPHERECTOMY (Bilateral)  Subjective: Patient reports minimal incisional pain.  "I am ready to eat."  Tolerating liquids.  "Feel good."  No complaints voiced.    Objective: Vital signs in last 24 hours: Temp:  [97.4 F (36.3 C)-98.8 F (37.1 C)] 98.8 F (37.1 C) (09/25 0553) Pulse Rate:  [68-84] 84  (09/25 0848) Resp:  [13-21] 16  (09/25 0553) BP: (103-168)/(55-92) 119/69 mmHg (09/25 0848) SpO2:  [96 %-100 %] 100 % (09/25 0553) Weight:  [147 lb 4.3 oz (66.8 kg)] 147 lb 4.3 oz (66.8 kg) (09/24 1742)    Intake/Output from previous day: 09/24 0701 - 09/25 0700 In: 3497.5 [P.O.:60; I.V.:3137.5; IV Piggyback:300] Out: 1750 [Urine:1600; Blood:150]  Physical Examination: General: alert, cooperative and no distress Resp: clear to auscultation bilaterally Cardio: regular rate and rhythm, S1, S2 normal, no murmur, click, rub or gallop GI: soft, non-tender; bowel sounds normal; no masses,  no organomegaly and incision: Midline dressing removed.  Staples intact.  Blood clot present on lower portion of the incision.  No signs of active bleeding at this time.  Clot removed and incision cleansed with normal saline per nursing staff.  Pressure dressing to be applied. Extremities: extremities normal, atraumatic, no cyanosis or edema  Labs:   BUN/Cr/glu/ALT/AST/amyl/lip:  13/0.66/--/--/--/--/-- (09/25 0510)  Assessment: 71 y.o. s/p Procedure(s): EXPLORATORY LAPAROTOMY HYSTERECTOMY ABDOMINAL SALPINGO OOPHERECTOMY: stable Pain:  Pain is well-controlled on PCA.  CV: Hypertension: Stable.  Current treatment:  amlodipine (Norvasc), benazepril (Lotensin) and Coreg.    GI:  Tolerating po: Yes.     FEN: Mild Hyponatremia post-operatively.  Na+ this am 128, pre-op 137.  Endo: Diabetes mellitus Type II, under good control..  CBG:  CBG (last 3)   Basename 02/21/12 0801 02/20/12 2009 02/20/12  1557  GLUCAP 164* 221* 124*   Prophylaxis: intermittent pneumatic compression boots.  Plan: Advance diet Encourage ambulation Advance to PO medication Discontinue PCA Change IVF to NS at 75 cc/hr CBC this am and Bmet at 14:00 to re-evaluate Na+ level Restart HCTZ If tolerating carb mod diet, plan to restart oral diabetic medications   LOS: 1 day    CROSS, MELISSA DEAL 02/21/2012, 8:56 AM

## 2012-02-21 NOTE — Progress Notes (Signed)
Utilization review completed.  

## 2012-02-22 ENCOUNTER — Inpatient Hospital Stay (HOSPITAL_COMMUNITY): Payer: Medicare Other

## 2012-02-22 LAB — BASIC METABOLIC PANEL
BUN: 9 mg/dL (ref 6–23)
CO2: 25 mEq/L (ref 19–32)
Chloride: 95 mEq/L — ABNORMAL LOW (ref 96–112)
GFR calc non Af Amer: 90 mL/min — ABNORMAL LOW (ref >90)
Glucose, Bld: 162 mg/dL — ABNORMAL HIGH (ref 70–99)
Potassium: 3.4 mEq/L — ABNORMAL LOW (ref 3.5–5.1)
Sodium: 131 mEq/L — ABNORMAL LOW (ref 135–145)

## 2012-02-22 LAB — URINALYSIS, ROUTINE W REFLEX MICROSCOPIC
Hgb urine dipstick: NEGATIVE
Protein, ur: 100 mg/dL — AB
Specific Gravity, Urine: 1.046 — ABNORMAL HIGH (ref 1.005–1.030)
Urobilinogen, UA: 0.2 mg/dL (ref 0.0–1.0)

## 2012-02-22 LAB — GLUCOSE, CAPILLARY
Glucose-Capillary: 162 mg/dL — ABNORMAL HIGH (ref 70–99)
Glucose-Capillary: 127 mg/dL — ABNORMAL HIGH (ref 70–99)

## 2012-02-22 LAB — URINE MICROSCOPIC-ADD ON

## 2012-02-22 MED ORDER — BISACODYL 10 MG RE SUPP
10.0000 mg | Freq: Every day | RECTAL | Status: DC | PRN
  Filled 2012-02-22: qty 1

## 2012-02-22 MED ORDER — INSULIN ASPART 100 UNIT/ML ~~LOC~~ SOLN
0.0000 [IU] | SUBCUTANEOUS | Status: DC
  Administered 2012-02-22 – 2012-02-23 (×3): 3 [IU] via SUBCUTANEOUS
  Administered 2012-02-23 (×2): 5 [IU] via SUBCUTANEOUS
  Administered 2012-02-23: 3 [IU] via SUBCUTANEOUS
  Administered 2012-02-24: 2 [IU] via SUBCUTANEOUS
  Administered 2012-02-24: 3 [IU] via SUBCUTANEOUS
  Administered 2012-02-24 (×3): 2 [IU] via SUBCUTANEOUS
  Administered 2012-02-24: 3 [IU] via SUBCUTANEOUS
  Administered 2012-02-25 – 2012-02-28 (×11): 2 [IU] via SUBCUTANEOUS
  Administered 2012-02-28: 3 [IU] via SUBCUTANEOUS

## 2012-02-22 MED ORDER — POTASSIUM CHLORIDE 2 MEQ/ML IV SOLN
INTRAVENOUS | Status: DC
  Administered 2012-02-22 – 2012-02-23 (×3): via INTRAVENOUS
  Filled 2012-02-22 (×4): qty 1000

## 2012-02-22 NOTE — Progress Notes (Signed)
2 Days Post-Op Procedure(s) (LRB): EXPLORATORY LAPAROTOMY (N/A) HYSTERECTOMY ABDOMINAL (N/A) SALPINGO OOPHERECTOMY (Bilateral)  Subjective: Patient reports no complaints.  Denies pain at this time.  Denies nausea, vomiting, chest pain, shortness of breath, passing flatus, or having a bowel movement.  Ready for dinner at 5 pm.   Objective: Vital signs in last 24 hours: Temp:  [98.2 F (36.8 C)-99.6 F (37.6 C)] 99.6 F (37.6 C) (09/26 1300) Pulse Rate:  [74-97] 97  (09/26 1300) Resp:  [16-22] 18  (09/26 1300) BP: (128-161)/(57-99) 132/82 mmHg (09/26 1300) SpO2:  [92 %-96 %] 96 % (09/26 1300) Last BM Date: 02/19/12  Intake/Output from previous day: 09/25 0701 - 09/26 0700 In: 1477.5 [P.O.:840; I.V.:637.5] Out: 2220 [Urine:2220]  Physical Examination: General: alert, cooperative and no distress Resp: clear to auscultation bilaterally Cardio: irregularly irregular rhythm and with rate of 100 bpm GI: abnormal findings:  distended, incision: clean, dry, intact and midline with staples and active bowel sounds noted, tympanic on percussion, increase in abdominal firmness compared with am assessment Extremities: extremities normal, atraumatic, no cyanosis or edema  Labs:   BUN/Cr/glu/ALT/AST/amyl/lip:  9/0.58/--/--/--/--/-- (09/26 0442)  Assessment: 71 y.o. s/p Procedure(s): EXPLORATORY LAPAROTOMY HYSTERECTOMY ABDOMINAL SALPINGO OOPHERECTOMY: stable Pain:  Pain is well-controlled on oral medications.  CV: Arrhythmia: Irregularly irregular rhythm.  Pt asymptomatic.  EKG from this am resulting sinus tach with premature atrial complexes.  Pre-op EKG revealed the sinus rhythm with premature atrial complexes.    Hypertension: Stable.  Current treatment:  amlodipine (Norvasc), benazepril (Lotensin) and Coreg.  GI:  Tolerating po: Yes.  Nursing staff denies patient passing flatus or having a bowel movement.     FEN: Mild hyponatremia improving.  Mild hypokalemia, 3.4 this am.     Endo: Diabetes mellitus Type II, under good control.  CBG:  CBG (last 3)   Basename 02/22/12 1643 02/22/12 1145 02/22/12 0718  GLUCAP 127* 155* 162*    Prophylaxis: intermittent pneumatic compression boots.  Plan: Dr. Delsa Sale notified of the above findings Portable KUB to evaluate increased abdominal firmness and distention post operatively Bmet in the am Staff to notify MD if heart rate increases over 120 bpm Replace foley due to retention.  ? Neurogenic bladder after CVA Send urine specimen for UA and culture   LOS: 2 days    Natalie Rodgers 02/22/2012, 5:04 PM

## 2012-02-22 NOTE — Progress Notes (Signed)
Patient has not voided since Foley cath d/c at 0630. Onto bedside x2 intervals without voiding. Abdomen soft with distention. Abd discomfort denied. Bladder scan 65cc urine. Joylene John NP notified with telephone order verified for I&O cath.

## 2012-02-22 NOTE — Progress Notes (Signed)
Natalie John, NP on floor has viewed all EKG and states will contact patient PCP, no further orders at this time.

## 2012-02-22 NOTE — Progress Notes (Signed)
Spoke with Lynnell Catalan, NP who provides primary care and medication management for the patient.  Reporting that the previous EKG reports on file for the patient show normal sinus rhythm.  Stating that the patient does not have a cardiologist.  Discussed the EKG findings from this am and pre-op.  Her number is [phone].  Called to speak with Kerin Ransom, [place] for cardiology to see if patient needed an inpatient consult in his opinion or if she could follow up outpatient once discharged.  Plans for patient to follow up outpatient since asymptomatic.

## 2012-02-22 NOTE — Progress Notes (Signed)
2 Days Post-Op Procedure(s) (LRB): EXPLORATORY LAPAROTOMY (N/A) HYSTERECTOMY ABDOMINAL (N/A) SALPINGO OOPHERECTOMY (Bilateral)  Subjective: Patient reports no complaints.  Tolerating PO intake.  Denies passing flatus or having a bowel movement.  Denies shortness of breath, chest pain, dizziness, nausea, or vomiting.  Pain controlled with oral medications.   Objective: Vital signs in last 24 hours: Temp:  [97.7 F (36.5 C)-99.3 F (37.4 C)] 98.8 F (37.1 C) (09/26 0812) Pulse Rate:  [74-90] 74  (09/26 0812) Resp:  [16-22] 22  (09/26 0812) BP: (128-161)/(57-84) 152/84 mmHg (09/26 0812) SpO2:  [92 %-99 %] 92 % (09/26 0812) Last BM Date: 02/19/12  Intake/Output from previous day: 09/25 0701 - 09/26 0700 In: 1477.5 [P.O.:840; I.V.:637.5] Out: 2220 [Urine:2220]  Physical Examination: General: alert, cooperative and no distress Resp: clear to auscultation bilaterally Cardio: irregularly irregular rhythm GI: soft, non-tender; bowel sounds normal; no masses,  no organomegaly, incision: midline with staples open to air, incision clean, dry and intact. and abd distended and tympanic on percussion Extremities: extremities normal, atraumatic, no cyanosis or edema  Labs:   BUN/Cr/glu/ALT/AST/amyl/lip:  9/0.58/--/--/--/--/-- (09/26 [id number])  Assessment: 71 y.o. s/p Procedure(s): EXPLORATORY LAPAROTOMY HYSTERECTOMY ABDOMINAL SALPINGO OOPHERECTOMY: stable Pain:  Pain is well-controlled on oral medications.  CV:  Arrhythmia:  Irregularly irregular rhythm noted, rate 104.  EKG this am shows sinus tachycardia with premature atrial complexes.  Pre-operative EKG on 02/16/12 at 9:58am found on the paper chart showed sinus rhythm with premature atrial complexes.  Patient asymptomatic.  Plan to contact primary care provider to see if patient has a cardiologist and who manages her cardiac medications.   Hypertension:  Stable.  Current treatment:  amlodipine (Norvasc), benazepril (Lotensin) and  Coreg.  GI:  Tolerating po: Yes.  No flatus or bowel movement.  Dulcolax suppository this am.  GU:  Due to void, foley replaced last pm due to retention and discontinued this am.  FEN: Mild hyponatremia post-operatively improving, Na+ this am 131.  Mild hypokalemia: K+ 3.4 this am.  Endo: Diabetes mellitus Type II, under good control..  CBG:  CBG (last 3)   Basename 02/22/12 0718 02/21/12 2110 02/21/12 1733  GLUCAP 162* 136* 199*   Prophylaxis: intermittent pneumatic compression boots.  Plan: Discontinue IV fluids Plan to contact Natalie Catalan, NP who provides primary care for the patient Dulcolax suppository today Continue PO K+ replacement Social work for discharge plans for rehab PT evaluation to assist with ambulation and to evaluate status for rehab placement    LOS: 2 days    Natalie Rodgers 02/22/2012, 9:37 AM

## 2012-02-22 NOTE — Evaluation (Signed)
Physical Therapy Evaluation Patient Details Name: Natalie Rodgers MRN: [id number] DOB: 05-29-1941 Today's Date: 02/22/2012 Time: [id number] PT Time Calculation (min): 22 min  PT Assessment / Plan / Recommendation Clinical Impression  71 y.o. female from ALF and h/o CVA with R HP, admitted with large pelvic mass. Pt is s/p hysterectomy 02/20/12.  Pt's speech is difficult to understand, she stated that at ALF she did not get OOB.  Today she was able to transfer from bed to chair with mod assist. SNF level of care recommended.  Pt would benefit from acute PT to maximize safety and independence with mobility.    PT Assessment  Patient needs continued PT services    Follow Up Recommendations  Skilled nursing facility;Supervision/Assistance - 24 hour    Barriers to Discharge        Equipment Recommendations  Rolling walker with 5" wheels;Wheelchair (measurements)    Recommendations for Other Services OT consult   Frequency Min 3X/week    Precautions / Restrictions Precautions Precautions: None Restrictions Weight Bearing Restrictions: No   Pertinent Vitals/Pain *pt reported mild pain at incision site, not rated**      Mobility  Bed Mobility Bed Mobility: Not assessed Transfers Transfers: Sit to Stand;Stand to Sit;Stand Pivot Transfers Sit to Stand: 3: Mod assist;From bed Stand to Sit: 3: Mod assist;To chair/3-in-1 Stand Pivot Transfers: 3: Mod assist Details for Transfer Assistance: decreased weight bearing RLE, mod assist to achieve standing and to pivot to [facility] then to chair, VCs for hand placement with stand to sit Ambulation/Gait Ambulation/Gait Assistance: Not tested (comment)    Shoulder Instructions     Exercises     PT Diagnosis: Hemiplegia dominant side;Difficulty walking  PT Problem List: Decreased strength;Decreased activity tolerance;Decreased mobility PT Treatment Interventions: Functional mobility training;Gait training;Therapeutic activities;Therapeutic  exercise;Patient/family education   PT Goals Acute Rehab PT Goals PT Goal Formulation: With patient Time For Goal Achievement: 03/07/12 Potential to Achieve Goals: Fair Pt will go Supine/Side to Sit: with min assist;with HOB 0 degrees PT Goal: Supine/Side to Sit - Progress: Goal set today Pt will go Sit to Stand: with min assist PT Goal: Sit to Stand - Progress: Goal set today Pt will Transfer Bed to Chair/Chair to Bed: with min assist PT Transfer Goal: Bed to Chair/Chair to Bed - Progress: Goal set today Pt will Propel Wheelchair: 10 - 50 feet;with min assist PT Goal: Propel Wheelchair - Progress: Goal set today  Visit Information  Last PT Received On: 02/22/12    Subjective Data  Subjective: My room mate at the retirement home is a devil.  Patient Stated Goal: none stated   Prior Functioning  Home Living Type of Home: Assisted living Home Access: Level entry Home Layout: One level Additional Comments: pt stated she stayed in bed all the time at her "retirement home", stated she did not get up to a chair or WC Prior Function Level of Independence: Needs assistance Needs Assistance: Bathing;Dressing;Toileting;Meal Prep;Light Housekeeping;Transfers Bath: Moderate Dressing: Moderate Toileting: Moderate Meal Prep: Total Light Housekeeping: Total Able to Take Stairs?: No Driving: No Communication Communication: Expressive difficulties;Prefers language other than English (speech difficult to understand) Dominant Hand: Left    Cognition  Overall Cognitive Status: Appears within functional limits for tasks assessed/performed Arousal/Alertness: Awake/alert Orientation Level: Appears intact for tasks assessed Behavior During Session: [facility] for tasks performed    Extremity/Trunk Assessment Right Upper Extremity Assessment RUE ROM/Strength/Tone: Deficits RUE ROM/Strength/Tone Deficits: h/o stroke with R hemiparesis, shoulder flexion AROM approx 20* Left Upper Extremity  Assessment LUE ROM/Strength/Tone: WFL for tasks assessed LUE Sensation: WFL - Light Touch LUE Coordination: WFL - gross/fine motor Right Lower Extremity Assessment RLE ROM/Strength/Tone: Deficits RLE ROM/Strength/Tone Deficits: knee ext +2/5 RLE Sensation: WFL - Light Touch Left Lower Extremity Assessment LLE ROM/Strength/Tone: WFL for tasks assessed LLE Sensation: WFL - Light Touch LLE Coordination: WFL - gross/fine motor Trunk Assessment Trunk Assessment: Normal   Balance    End of Session PT - End of Session Equipment Utilized During Treatment: Gait belt Activity Tolerance: Patient tolerated treatment well Patient left: in chair;with call bell/phone within reach;with nursing in room Nurse Communication: Mobility status  GP     Natalie Rodgers 02/22/2012, 11:57 AM [phone]

## 2012-02-22 NOTE — Progress Notes (Signed)
FL2 in chart for MD signature. SNF bed offers provided to pt's daughter today. She is reviewing offers and will contact CSW with her choice. CSW will continue to follow to assist with d/c planning to SNF.  Werner Lean LCSW [phone]

## 2012-02-23 ENCOUNTER — Inpatient Hospital Stay (HOSPITAL_COMMUNITY): Payer: Medicare Other

## 2012-02-23 LAB — DIFFERENTIAL
Basophils Absolute: 0 10*3/uL (ref 0.0–0.1)
Basophils Relative: 0 % (ref 0–1)
Eosinophils Relative: 0 % (ref 0–5)
Monocytes Absolute: 2 10*3/uL — ABNORMAL HIGH (ref 0.1–1.0)

## 2012-02-23 LAB — URINALYSIS, ROUTINE W REFLEX MICROSCOPIC
Nitrite: NEGATIVE
Specific Gravity, Urine: >1.046 — ABNORMAL HIGH (ref 1.005–1.030)
Urobilinogen, UA: 0.2 mg/dL (ref 0.0–1.0)

## 2012-02-23 LAB — BASIC METABOLIC PANEL
BUN: 17 mg/dL (ref 6–23)
CO2: 26 mEq/L (ref 19–32)
Calcium: 9.3 mg/dL (ref 8.4–10.5)
Calcium: 8.8 mg/dL (ref 8.4–10.5)
Creatinine, Ser: 0.64 mg/dL (ref 0.50–1.10)
Creatinine, Ser: 0.6 mg/dL (ref 0.50–1.10)
GFR calc non Af Amer: 87 mL/min — ABNORMAL LOW (ref >90)
GFR calc non Af Amer: 89 mL/min — ABNORMAL LOW (ref >90)
Glucose, Bld: 160 mg/dL — ABNORMAL HIGH (ref 70–99)
Sodium: 135 mEq/L (ref 135–145)
Sodium: 133 mEq/L — ABNORMAL LOW (ref 135–145)

## 2012-02-23 LAB — CBC
MCV: 86.4 fL (ref 78.0–100.0)
Platelets: 346 10*3/uL (ref 150–400)
RDW: 13 % (ref 11.5–15.5)
WBC: 21.7 10*3/uL — ABNORMAL HIGH (ref 4.0–10.5)

## 2012-02-23 LAB — GLUCOSE, CAPILLARY
Glucose-Capillary: 179 mg/dL — ABNORMAL HIGH (ref 70–99)
Glucose-Capillary: 202 mg/dL — ABNORMAL HIGH (ref 70–99)
Glucose-Capillary: 172 mg/dL — ABNORMAL HIGH (ref 70–99)

## 2012-02-23 LAB — URINE MICROSCOPIC-ADD ON

## 2012-02-23 MED ORDER — PANTOPRAZOLE SODIUM 40 MG IV SOLR
40.0000 mg | INTRAVENOUS | Status: DC
  Administered 2012-02-24 – 2012-02-28 (×5): 40 mg via INTRAVENOUS
  Filled 2012-02-23 (×5): qty 40

## 2012-02-23 MED ORDER — SODIUM CHLORIDE 0.9 % IV BOLUS (SEPSIS)
500.0000 mL | Freq: Once | INTRAVENOUS | Status: AC
  Administered 2012-02-23: 500 mL via INTRAVENOUS

## 2012-02-23 MED ORDER — HYDROMORPHONE 0.3 MG/ML IV SOLN
INTRAVENOUS | Status: DC
  Administered 2012-02-23: 13:00:00 via INTRAVENOUS
  Administered 2012-02-24 (×2): 0.2 mg via INTRAVENOUS
  Administered 2012-02-24: 0.4 mg via INTRAVENOUS
  Administered 2012-02-24: 0.199 mg via INTRAVENOUS
  Filled 2012-02-23 (×2): qty 25

## 2012-02-23 MED ORDER — SODIUM CHLORIDE 0.45 % IV SOLN
INTRAVENOUS | Status: DC
  Administered 2012-02-23 – 2012-02-25 (×3): via INTRAVENOUS

## 2012-02-23 MED ORDER — IOHEXOL 350 MG/ML SOLN
100.0000 mL | Freq: Once | INTRAVENOUS | Status: AC | PRN
  Administered 2012-02-23: 80 mL via INTRAVENOUS

## 2012-02-23 MED ORDER — POTASSIUM CHLORIDE 2 MEQ/ML IV SOLN
INTRAVENOUS | Status: DC
  Administered 2012-02-23 – 2012-02-25 (×3): via INTRAVENOUS
  Filled 2012-02-23 (×5): qty 1000

## 2012-02-23 MED ORDER — DIPHENHYDRAMINE HCL 12.5 MG/5ML PO ELIX: 12.5000 mg | ORAL_SOLUTION | Freq: Four times a day (QID) | ORAL | Status: DC | PRN

## 2012-02-23 MED ORDER — ONDANSETRON HCL 4 MG/2ML IJ SOLN
4.0000 mg | Freq: Four times a day (QID) | INTRAMUSCULAR | Status: DC | PRN
  Administered 2012-02-24 – 2012-02-25 (×4): 4 mg via INTRAVENOUS
  Filled 2012-02-23 (×4): qty 2

## 2012-02-23 MED ORDER — IOHEXOL 300 MG/ML  SOLN
100.0000 mL | Freq: Once | INTRAMUSCULAR | Status: AC | PRN
  Administered 2012-02-23: 100 mL via INTRAVENOUS

## 2012-02-23 MED ORDER — NALOXONE HCL 0.4 MG/ML IJ SOLN: 0.4000 mg | INTRAMUSCULAR | Status: DC | PRN

## 2012-02-23 MED ORDER — DIPHENHYDRAMINE HCL 50 MG/ML IJ SOLN: 12.5000 mg | Freq: Four times a day (QID) | INTRAMUSCULAR | Status: DC | PRN

## 2012-02-23 MED ORDER — SODIUM CHLORIDE 0.9 % IJ SOLN: 9.0000 mL | INTRAMUSCULAR | Status: DC | PRN

## 2012-02-23 MED ORDER — ENOXAPARIN SODIUM 40 MG/0.4ML ~~LOC~~ SOLN
40.0000 mg | SUBCUTANEOUS | Status: DC
  Filled 2012-02-23: qty 0.4

## 2012-02-23 NOTE — Progress Notes (Signed)
CSW assisting with d/c planning. Pt's daughter is interested in several SNF bed offers. CSW will call SNF's on Monday to check bed availability. Will continue to follow to assist with d/c planning needs.  Werner Lean LCSW [phone]

## 2012-02-23 NOTE — Progress Notes (Signed)
3 Days Post-Op Procedure(s) (LRB): EXPLORATORY LAPAROTOMY (N/A) HYSTERECTOMY ABDOMINAL (N/A) SALPINGO OOPHERECTOMY (Bilateral)  Subjective: Patient reports "feeling sick."  Dr. Lahoma Crocker present at this time.  Holding emesis basin with minimal amount of light brown fluid noted.  "Hungry."  Denies shortness of breath, abdominal pain, chest pain, passing flatus, or having a bowel movement.    Objective: Vital signs in last 24 hours: Temp:  [98.1 F (36.7 C)-99.6 F (37.6 C)] 98.3 F (36.8 C) (09/27 0535) Pulse Rate:  [81-116] 90  (09/27 1016) Resp:  [18-20] 18  (09/27 0535) BP: (132-173)/(74-94) 152/80 mmHg (09/27 1016) SpO2:  [93 %-96 %] 93 % (09/27 0535) Last BM Date: 02/19/12  Intake/Output from previous day: 09/26 0701 - 09/27 0700 In: 665 [P.O.:665] Out: 1225 [Urine:1225]  Physical Examination: General: alert, cooperative and no distress Resp: mildly diminished in the bases Cardio: irregularly irregular rhythm and rate of 104 bpm GI: abnormal findings:  distended, hypoactive bowel sounds and firm, tympanic on percussion, non-tender and incision: midline with staples clean, dry, and intact Extremities: extremities normal, atraumatic, no cyanosis or edema  Labs:   BUN/Cr/glu/ALT/AST/amyl/lip:  17/0.64/--/--/--/--/-- (09/27 0426)  Assessment: 71 y.o. s/p Procedure(s): EXPLORATORY LAPAROTOMY HYSTERECTOMY ABDOMINAL SALPINGO OOPHERECTOMY: stable Pain:  Pain is well-controlled on oral medications.  Plan to begin PCA reduced dose Dilaudid due to episodes of emesis, post-operative ileus per KUB, NPO status, and NG tube placement.  CV: Arrhythmia: Irregularly irregular with rate 104 bpm.  O2 saturation at 100% on 2 L.  EKG performed this am at 9:45am revealed sinus tachycardia with premature atrial complexes: no change from previous EKG on 02/22/12, pre-op EKG showing sinus rhythm with premature atrial complexes.  Plan to monitor.  Nursing staff to notify Dr. Delsa Sale  or myself if heart rate is 120 bpm or greater.  Plans for patient to follow up outpatient with a cardiologist.  Hypertension: Stable.  Current treatment:  amlodipine (Norvasc), benazepril (Lotensin) and Coreg.  GI:  Tolerating po: No: NPO   Episode of emesis last pm and this am.  Pt with post operative ileus per portable KUB performed in the pm of 02/22/12.  Plan for NG tube placement and CT of the abdomen and pelvis with contrast this am.  Contrast to be given through NG tube.  Plan to initiate gI stress ulcer prophylaxis includes: proton pump inhibitor per orders. Treatment for N/V: zofran intravenous.  FEN:  Hyponatremia and hypokalemia post operatively resolved at this time.  Endo: Diabetes mellitus Type II, under good control..  CBG:  CBG (last 3)   Basename 02/23/12 0740 02/23/12 0353 02/22/12 2345  GLUCAP 202* 179* 210*    Prophylaxis: intermittent pneumatic compression boots.  Plan: Place NG tube for gastric decompression CT scan of the abdomen and pelvis with contrast this am with oral contrast to be given via NG tube NPO CBC and Bmet daily Daily weights Urinalysis this am from foley catheter Protonix IV daily Plan to replace volume intravenously based on output from initial NG placement   LOS: 3 days    CROSS, MELISSA DEAL 02/23/2012, 11:54 AM

## 2012-02-23 NOTE — Progress Notes (Signed)
Spoke with Dr. Leighton Ruff with [facility] per Dr. Delsa Sale to discuss the patient and her post-operative course for recommendations on any further testing that should take place.  Appreciate her assistance.  After reviewing lab results and radiology studies including a CT scan of the abdomen/pelvis performed this afternoon, she recommended checking a lactate level to assess for bowel ischemia.  Dr. Jodi Mourning on call tonight for Dr. Delsa Sale and he can be reached at [phone] if further discussion needed.

## 2012-02-23 NOTE — Progress Notes (Addendum)
Have spoken with Natalie John, NP informed of irregularies in HR this am between dinamap and apical also that O@ sat in 80s this am and o2 applied. abd distended and poor urine output. Also informed her that patient c/o dizziness but denies chest pain.

## 2012-02-23 NOTE — Progress Notes (Signed)
Melissa Cross on floor and following along with care, close monitoring of patient output. We have discussed foul smell to NG output. Continued expiritory wheezing.

## 2012-02-23 NOTE — Progress Notes (Signed)
Have spoken to Joylene John, NP multiple times to report on output and progress of test results, orders being entered.

## 2012-02-23 NOTE — Progress Notes (Signed)
Patient being taken for CT scan. Oral contrast inserted through NGT as per instruction of Joylene John, NP

## 2012-02-23 NOTE — Progress Notes (Addendum)
Spoke with Dr. Domenic Polite with [facility] cardiology.  Informed of patient's history and recent EKG findings of sinus tach with premature atrial complexes.  Stating that normally they would assess for an underlying issue such as infection or a pulmonary embolus.  CT angiogram was negative for a PE.  Urine culture pending.  Stating that an echo could be ordered over the weekend if the tachycardia with PACs continues.  Appreciate Dr. Myles Gip assistance and recommendation.  Dr. Jodi Mourning notified about recommendations from general surgery and cardiology along with the CT angiogram being negative for PE.

## 2012-02-23 NOTE — Progress Notes (Signed)
Episode of emesis three minutes before NG tube placement.  Pt wheezing mildly in the upper lobes after episode of emesis.  O2 saturation at 100% on 2 L.  Procedure explained to patient.  NG tube placed at 11:35am with no difficulty per nursing staff.  Placement assessed.  Patient tolerated tube placement well.  Initial output of 150 cc noted.  Drainage with food particles present initially with the transition to light brown fluid.  Plan to monitor output and replace volume intravenously as needed.  Pt in no distress at this time.  Wheezing resolved after NG tube placement.  O2 saturation at 95% on room air at this time.  Heart rate at 104 bpm.  Pt resting quietly in the bed when exiting the room.  Katie, RN to begin giving contrast via NG tube for her CT scan around 1:30pm.

## 2012-02-23 NOTE — Progress Notes (Signed)
3 Days Post-Op Procedure(s) (LRB): EXPLORATORY LAPAROTOMY (N/A) HYSTERECTOMY ABDOMINAL (N/A) SALPINGO OOPHERECTOMY (Bilateral)  Subjective: At 9:37 am today, staff called with reports that the patient was short of breath, wheezing intermittently, O2 saturation at 89% on 2 L, and heart rate irregularly irregular in the 120s.  Dr. Delsa Sale, in surgery, notified of findings.  EKG ordered at that time to be completed upon arrival to the floor to assess the patient.  Pt resting quietly in bed upon arrival.  Patient stating "I am hungry."  Denies chest pain, abdominal pain, dyspnea, nausea, vomiting, passing flatus, or having a bowel movement.  Apical heart rate noted at 100 bpm and irregularly irregular.  Pulse ox sticker on right index finger loose with poor contact on the nail bed.  New pulse ox sticker applied with O2 saturation reading 100% on 2 L on the dynamap and the pulse ox machine.  Nursing staff reporting improvement in shortness of breath after repositioning.  Reporting episode of emesis last pm found on the patient's sheets.    Objective: Vital signs in last 24 hours: Temp:  [98.1 F (36.7 C)-99.6 F (37.6 C)] 98.3 F (36.8 C) (09/27 0535) Pulse Rate:  [81-116] 90  (09/27 1016) Resp:  [18-20] 18  (09/27 0535) BP: (132-173)/(74-94) 152/80 mmHg (09/27 1016) SpO2:  [93 %-96 %] 93 % (09/27 0535) Last BM Date: 02/19/12  Intake/Output from previous day: 09/26 0701 - 09/27 0700 In: 665 [P.O.:665] Out: 1225 [Urine:1225]  Physical Examination: General: alert, cooperative and no distress Resp: mildly diminished bilaterally in the bases.  No wheezing, crackles, or rhonchi noted during ausculation.  Prior to leaving the room, patient began wheezing midly for three to four seconds which resolved when awakened Cardio: irregularly irregular rhythm and rate at 100 bpm GI: abnormal findings:  distended, hypoactive bowel sounds and increase in abdominal firmness and distention compared with  pm assessment on 02/22/12.  Tympanic on percussion and incision: midline incision with staples clean, dry, and intact Extremities: extremities normal, atraumatic, no cyanosis or edema  Labs:   BUN/Cr/glu/ALT/AST/amyl/lip:  17/0.64/--/--/--/--/-- (09/27 0426)  Assessment: 71 y.o. s/p Procedure(s): EXPLORATORY LAPAROTOMY HYSTERECTOMY ABDOMINAL SALPINGO OOPHERECTOMY: stable Pain:  Pain is well-controlled on oral medications.    CV: Arrhythmia: Irregularly irregular with rate 100 bpm.  EKG performed this am at 9:45am revealed sinus tachycardia with premature atrial complexes: no change from previous EKG on 02/22/12, pre-op EKG showing sinus rhythm with premature atrial complexes.  Plan to monitor.  Nursing staff to notify Dr. Delsa Sale or myself if heart rate is 120 bpm or greater.  Plans for patient to follow up outpatient with a cardiologist.  Hypertension: Stable.  Current treatment:  amlodipine (Norvasc), benazepril (Lotensin) and Coreg.  GI:  Tolerating po: No: NPO   Episode of emesis last pm and this am.  Pt with post operative ileus per portable KUB performed in the pm of 02/22/12.    FEN:  Hyponatremia and hypokalemia post operatively resolved at this time.  Endo: Diabetes mellitus Type II, under good control..  CBG:  CBG (last 3)   Basename 02/23/12 0740 02/23/12 0353 02/22/12 2345  GLUCAP 202* 179* 210*    Prophylaxis: intermittent pneumatic compression boots.  Plan: Monitor heart rate and oxygen saturation Remain NPO Patient stable at this time.  Plan to monitor until Dr. Anson Crofts arrival.   LOS: 3 days    Coralie Stanke DEAL 02/23/2012, 10:46 AM

## 2012-02-23 NOTE — Progress Notes (Signed)
3 Days Post-Op Procedure(s) (LRB): EXPLORATORY LAPAROTOMY (N/A) HYSTERECTOMY ABDOMINAL (N/A) SALPINGO OOPHERECTOMY (Bilateral)  Subjective: Patient reports no complaints.  "Can I eat today?"  Denies chest pain, dyspnea, abdominal pain, nausea, vomiting, passing flatus, or having a bowel movement.   Objective: Vital signs in last 24 hours: Temp:  [98.1 F (36.7 C)-99 F (37.2 C)] 98.4 F (36.9 C) (09/27 1400) Pulse Rate:  [81-116] 82  (09/27 1400) Resp:  [18-20] 20  (09/27 1613) BP: (148-173)/(72-98) 149/98 mmHg (09/27 1400) SpO2:  [93 %-99 %] 99 % (09/27 1613) Last BM Date: 02/19/12  Intake/Output from previous day: 09/26 0701 - 09/27 0700 In: 665 [P.O.:665] Out: 1225 [Urine:1225]  Physical Examination: General: alert, cooperative and no distress Resp: diminished breath sound bilaterally in lower bases, wheezing noted in bilateral upper lobes, labored breathing observed, oxygen saturation  Cardio: irregularly irregular rhythm and rate 90 bpm GI: abnormal findings:  distended, hypoactive bowel sounds and firm, tympanic on percussion and incision: clean, dry, intact and midline with staples Extremities: extremities normal, atraumatic, no cyanosis or edema  Labs: WBC/Hgb/Hct/Plts:  21.7/11.7/34.2/346 (09/27 1555) BUN/Cr/glu/ALT/AST/amyl/lip:  17/0.64/--/--/--/--/-- (09/27 0426)  Assessment: 71 y.o. s/p Procedure(s): EXPLORATORY LAPAROTOMY HYSTERECTOMY ABDOMINAL SALPINGO OOPHERECTOMY: stable and ileus present Pain:  Pain is well-controlled on PCA.  CV: Arrhythmia: Irregularly irregular rhythm with rate at 90 bpm.  EKG this am resulting sinus tach with premature atrial complexes.  Pre-op EKG showed sinus rhythm with premature atrial complexes.  Per Lynnell Catalan, NP who manages her primary care at the assisted living facility, PACs are a new finding for the patient.  She reports managing the patient's antihypertensive medications with past EKGs in their system as normal sinus  rhythm.  Patient does not see a cardiologist.  Hypertension: Stable.  Current treatment:  amlodipine (Norvasc), benazepril (Lotensin) and Coreg.  Respiratory:  Development of wheezing intermittently post operatively.  Portable chest x ray this am showed:  suboptimal inspiration with atelectasis at the left lung base. No acute cardiopulmonary disease otherwise.  O2 saturation 100% on 2 L.  Plan for CT angiogram of the chest to rule out pulmonary embolus post-operatively per Dr. Delsa Sale.   GI:  Tolerating po: No: NPO   Pt with post operative ileus.  The findings on Port KUB on 02/22/12 were: 1. Gas pattern suggestive of postoperative ileus. Given little distal bowel gas, recommend follow-up study to exclude developing distal obstruction.  2. Granular opacity in the lower abdomen pelvis might reflect retained stool.  The CT scan performed 02/23/12 had the following:  Multiple dilated loops of small and large bowel, as described above. The appearance remains most compatible with adynamic ileus in the recent postoperative setting. Follow-up radiographs are suggested.  Status post hysterectomy and bilateral salpingo-oophorectomy.  Associated postsurgical changes with abdominopelvic ascites and a  small amount of layering hemorrhage.  GI stress ulcer prophylaxis includes: proton pump inhibitor per orders. Treatment for N/V: zofran intravenous.  NG tube in place.  Total of 1300 cc out since placement with 600 cc being probable CT oral contrast.  FEN: Mild hyponatremia and hypokalemia post operatively resolving.  Genitourinary:  Urine output decreased, 175 cc total today.  Creat 0.64, BUN 17 this am.  Specific gravity on urine specimen sent this afternoon >1.046.  Plan to recheck Bmet now and give 500cc NS bolus per Dr. Delsa Sale as of 17:25pm.  Urine culture pending.  Endo: Diabetes mellitus Type II, under good control..  CBG:   CBG (last 3)   Basename 02/23/12 1619 02/23/12  1320 02/23/12 0740    GLUCAP 163* 172* 202*    Prophylaxis: intermittent pneumatic compression boots.  Lovenox discontinued per Dr. Delsa Sale due to CT scan findings of a small amount of layering hemorrhage.    Plan: Per Dr. Delsa Sale: Spiral CT to rule out post operative PE Cardiology consult for recommendations on irregularly irregular rhythm of sinus tachycardia with premature atrial complexes.  Spoke with a representative for the physician on call at 16:30 pm. Keep oxygen saturation above 92% Continue NPO status and NG tube to intermittent suction Add 1/2 NS at 50cc/hr to existing D5NS + 20KCL at 125 for a total IVF rate of 175 cc/hr to replace volume lost through NG drainage and to address decreased urine output Add a differential to the CBC drawn this afternoon for further evaluation of WBC count of 21.7.   CBC with diff and Bmet in the am  LOS: 3 days    Cleveland Yarbro DEAL 02/23/2012, 4:16 PM

## 2012-02-24 ENCOUNTER — Encounter (HOSPITAL_COMMUNITY): Payer: Self-pay | Admitting: Cardiology

## 2012-02-24 DIAGNOSIS — R1903 Right lower quadrant abdominal swelling, mass and lump: Secondary | ICD-10-CM

## 2012-02-24 DIAGNOSIS — I10 Essential (primary) hypertension: Secondary | ICD-10-CM | POA: Diagnosis present

## 2012-02-24 DIAGNOSIS — K560 Paralytic ileus: Secondary | ICD-10-CM

## 2012-02-24 DIAGNOSIS — N390 Urinary tract infection, site not specified: Secondary | ICD-10-CM | POA: Diagnosis present

## 2012-02-24 DIAGNOSIS — E8770 Fluid overload, unspecified: Secondary | ICD-10-CM

## 2012-02-24 DIAGNOSIS — E8779 Other fluid overload: Secondary | ICD-10-CM

## 2012-02-24 DIAGNOSIS — E119 Type 2 diabetes mellitus without complications: Secondary | ICD-10-CM | POA: Diagnosis present

## 2012-02-24 DIAGNOSIS — I491 Atrial premature depolarization: Secondary | ICD-10-CM

## 2012-02-24 DIAGNOSIS — R0602 Shortness of breath: Secondary | ICD-10-CM | POA: Diagnosis not present

## 2012-02-24 DIAGNOSIS — I471 Supraventricular tachycardia: Secondary | ICD-10-CM

## 2012-02-24 LAB — CBC
HCT: 31.9 % — ABNORMAL LOW (ref 36.0–46.0)
Hemoglobin: 10.8 g/dL — ABNORMAL LOW (ref 12.0–15.0)
MCH: 29.6 pg (ref 26.0–34.0)
MCHC: 33.9 g/dL (ref 30.0–36.0)
MCV: 87.4 fL (ref 78.0–100.0)
Platelets: 348 10*3/uL (ref 150–400)
RBC: 3.65 MIL/uL — ABNORMAL LOW (ref 3.87–5.11)
RDW: 13.3 % (ref 11.5–15.5)
WBC: 12.6 10*3/uL — ABNORMAL HIGH (ref 4.0–10.5)

## 2012-02-24 LAB — BASIC METABOLIC PANEL
CO2: 23 mEq/L (ref 19–32)
Calcium: 8.3 mg/dL — ABNORMAL LOW (ref 8.4–10.5)
Creatinine, Ser: 0.62 mg/dL (ref 0.50–1.10)
Glucose, Bld: 162 mg/dL — ABNORMAL HIGH (ref 70–99)

## 2012-02-24 LAB — GLUCOSE, CAPILLARY
Glucose-Capillary: 167 mg/dL — ABNORMAL HIGH (ref 70–99)
Glucose-Capillary: 149 mg/dL — ABNORMAL HIGH (ref 70–99)
Glucose-Capillary: 123 mg/dL — ABNORMAL HIGH (ref 70–99)

## 2012-02-24 MED ORDER — FUROSEMIDE 10 MG/ML IJ SOLN
40.0000 mg | Freq: Once | INTRAMUSCULAR | Status: AC
  Administered 2012-02-24: 40 mg via INTRAVENOUS
  Filled 2012-02-24: qty 4

## 2012-02-24 MED ORDER — HYDRALAZINE HCL 20 MG/ML IJ SOLN: 10.0000 mg | Freq: Four times a day (QID) | INTRAMUSCULAR | Status: DC | PRN

## 2012-02-24 MED ORDER — METOPROLOL TARTRATE 1 MG/ML IV SOLN
5.0000 mg | Freq: Two times a day (BID) | INTRAVENOUS | Status: DC
  Filled 2012-02-24 (×2): qty 5

## 2012-02-24 MED ORDER — ENOXAPARIN SODIUM 40 MG/0.4ML ~~LOC~~ SOLN
40.0000 mg | SUBCUTANEOUS | Status: DC
Start: 2012-02-24 — End: 2012-03-01
  Administered 2012-02-24 – 2012-02-29 (×6): 40 mg via SUBCUTANEOUS
  Filled 2012-02-24 (×7): qty 0.4

## 2012-02-24 MED ORDER — ENOXAPARIN SODIUM 40 MG/0.4ML ~~LOC~~ SOLN
40.0000 mg | SUBCUTANEOUS | Status: DC
  Filled 2012-02-24 (×2): qty 0.4

## 2012-02-24 MED ORDER — METOPROLOL TARTRATE 1 MG/ML IV SOLN
5.0000 mg | Freq: Four times a day (QID) | INTRAVENOUS | Status: DC
  Administered 2012-02-24 – 2012-02-28 (×13): 5 mg via INTRAVENOUS
  Filled 2012-02-24 (×19): qty 5

## 2012-02-24 MED ORDER — ALBUTEROL SULFATE (5 MG/ML) 0.5% IN NEBU
2.5000 mg | INHALATION_SOLUTION | Freq: Four times a day (QID) | RESPIRATORY_TRACT | Status: DC
  Administered 2012-02-24 – 2012-02-26 (×8): 2.5 mg via RESPIRATORY_TRACT
  Filled 2012-02-24 (×10): qty 0.5

## 2012-02-24 MED ORDER — CIPROFLOXACIN IN D5W 400 MG/200ML IV SOLN
400.0000 mg | Freq: Two times a day (BID) | INTRAVENOUS | Status: DC
  Administered 2012-02-24 – 2012-02-28 (×9): 400 mg via INTRAVENOUS
  Filled 2012-02-24 (×10): qty 200

## 2012-02-24 NOTE — Progress Notes (Signed)
Dr. Jodi Mourning phoned unit to report Cardiologist to see pt this am. Received order for 12-lead EKG. Dr. Jodi Mourning aware of pt recent status VSS except RR mid 20's increasing to low 30's with movement in bed. O2  sats 90-96%. No acute distress noted. HR irregular. No complaints voiced except abd soreness.

## 2012-02-24 NOTE — Progress Notes (Signed)
Patient ID: Natalie Rodgers, female   DOB: [birth year], 71 y.o.   MRN: [id number] 4 Days Post-Op Procedure(s) (LRB): EXPLORATORY LAPAROTOMY (N/A) HYSTERECTOMY ABDOMINAL (N/A) SALPINGO OOPHERECTOMY (Bilateral)  Subjective: Patient reports bloatingand nausea.  Negative flatus.  Objective: Vital signs in last 24 hours: Temp:  [98.3 F (36.8 C)-98.6 F (37 C)] 98.5 F (36.9 C) (09/28 0208) Pulse Rate:  [81-105] 93  (09/28 0208) Resp:  [18-20] 20  (09/28 0455) BP: (132-170)/(70-98) 132/70 mmHg (09/28 0208) SpO2:  [95 %-99 %] 95 % (09/28 0455) Last BM Date: 02/19/12  Intake/Output from previous day: 09/27 0701 - 09/28 0700 In: 2738.3 [I.V.:2738.3] Out: 1475 [Urine:275; Emesis/NG output:1200]  Physical Examination: General: alert and no distress Resp: wheezes bibasilar Cardio: regular rate and rhythm, S1, S2 normal, no murmur, click, rub or gallop GI: abnormal findings:  absent bowel sounds and distended and incision: clean, dry and intact Extremities: extremities normal, atraumatic, no cyanosis or edema Vaginal Bleeding: none  Labs: WBC/Hgb/Hct/Plts:  12.6/10.8/31.9/348 (09/28 0425) BUN/Cr/glu/ALT/AST/amyl/lip:  18/0.62/--/--/--/--/-- (09/28 0425)   Assessment:  71 y.o. s/p Procedure(s): EXPLORATORY LAPAROTOMY HYSTERECTOMY ABDOMINAL SALPINGO OOPHERECTOMY: ileus present and expiratory wheezing.  Good O2 sats. Pain:  Pain is well-controlled on PCA or oral medications.  Heme:  WBC = 12.6    HGB = 10.8   BMET = WNL's  ID: none  CV: Hypertension: well controlled. Current treatment:  amlodipine (Norvase), benazipril (Lotensin) and Carvodilol.  GI:  Tolerating po: No: Pt with ileus.  The CT scan had the following: dilated loops of small bowel. GI stress ulcer prophylaxis includes: proton pump inhibitor per orders. Treatment for N/V: zofran intravenous.     Prophylaxis: pharmacologic prophylaxis (with any of the following: enoxaparin (Lovenox) 40mg  SQ 2 hours prior to  surgery then every day) and intermittent pneumatic compression boots.  Plan: NPO NG tube to intermittent suction. Respiratory therapy consult for expiratory wheezing.   LOS: 4 days    HARPER,CHARLES A 02/24/2012, 6:23 AM

## 2012-02-24 NOTE — Progress Notes (Signed)
4 Days Post-Op Procedure(s) (LRB): EXPLORATORY LAPAROTOMY (N/A) HYSTERECTOMY ABDOMINAL (N/A) SALPINGO OOPHERECTOMY (Bilateral)  Subjective: Patient reports fast breathing and abdominal pain and incisional pain.    Objective: Vital signs in last 24 hours: Temp:  [98.4 F (36.9 C)-99 F (37.2 C)] 99 F (37.2 C) (09/28 0622) Pulse Rate:  [82-100] 90  (09/28 1000) Resp:  [18-26] 26  (09/28 0918) BP: (132-167)/(70-98) 132/70 mmHg (09/28 0918) SpO2:  [95 %-100 %] 100 % (09/28 0918) Last BM Date: 02/19/12  Intake/Output from previous day: 09/27 0701 - 09/28 0700 In: 2738.3 [I.V.:2738.3] Out: 3125 [Urine:1075; Emesis/NG output:2050]  Physical Examination: General: alert, cooperative and mild distress Resp: diminished breath sounds bibasilar, wheezes bilaterally and tachypneic. Cardio: irregular rate and rhythm. GI: abnormal findings:  absent bowel sounds and distended and incision: clean, dry and intact Extremities: extremities normal, atraumatic, no cyanosis or edema Vaginal Bleeding: none  Labs: WBC/Hgb/Hct/Plts:  12.6/10.8/31.9/348 (09/28 0425) BUN/Cr/glu/ALT/AST/amyl/lip:  18/0.62/--/--/--/--/-- (09/28 0425)   Assessment:  71 y.o. s/p Procedure(s): EXPLORATORY LAPAROTOMY HYSTERECTOMY ABDOMINAL SALPINGO OOPHERECTOMY: stable and ileus present.  Surgery consultation requested.  Irregular cardiac rate and rhythm with runs of tachycardia and PAC's.  Cardiology consultation requested.  Possible developing pulmonary edema.  Will give Lasix challenge. Pain:  Pain is well-controlled on PCA or oral medications.  Heme: CBC and BMET stable.  ID: No evidence of infection  CV: Arrhythmia: Irregular rate and rhythm. Hypertension: Well controlled. DVT: none controlled. Current treatment:  amlodipine (Norvase), benazipril (Lotensin) and Carvodilol.  GI:  Tolerating po: No.  Pt with ileus.  The CT scan had the following: dilated loops of small bowel.  Also dilated loops of large bowel  and postoperative changes of ascites and layering of hemorrhage in pelvis.  Lungs were clear with no evidence of pulmonary edema or embolism.    The findings were c/w adynamic ileus.   GI stress ulcer prophylaxis includes: proton pump inhibitor per orders. Treatment for N/V: zofran intravenous.   Endo: Diabetes mellitus Type II, under good control.Marland Kitchen    Prophylaxis: Plan to start DVT prophylaxis with Lovenox.  Plan: NPO NG tube in place for ileus Hospitalist service consulted for general medical assistance.    LOS: 4 days    HARPER,CHARLES A 02/24/2012, 10:39 AM

## 2012-02-24 NOTE — Consult Note (Signed)
HPI: 71 year old female with past medical history of diabetes mellitus and hypertension and now status post exploratory laparotomy, abdominal hysterectomy and salpingo-oophorectomy for evaluation of tachycardia and irregular heart beat. Patient underwent the above procedure on September 24. The patient has developed a postoperative ileus. She has been noted to have an irregular heartbeat on examination and PACs on electrocardiogram. Cardiology is asked to evaluate. Patient is a difficult historian but states she has some dyspnea. No chest pain or palpitations.  Medications Prior to Admission  Medication Sig Dispense Refill  . amLODipine-benazepril (LOTREL) 5-20 MG per capsule Take 1 capsule by mouth every morning.       Marland Kitchen atorvastatin (LIPITOR) 20 MG tablet Take 20 mg by mouth at bedtime.       . carvedilol (COREG) 12.5 MG tablet Take 12.5 mg by mouth 2 (two) times daily with a meal.      . donepezil (ARICEPT) 10 MG tablet Take 10 mg by mouth at bedtime.       Marland Kitchen glimepiride (AMARYL) 2 MG tablet Take 2 mg by mouth daily before breakfast.      . hydrochlorothiazide (HYDRODIURIL) 25 MG tablet Take 25 mg by mouth at bedtime.       . metFORMIN (GLUCOPHAGE) 1000 MG tablet Take 1,000 mg by mouth 2 (two) times daily with a meal.      . omeprazole (PRILOSEC) 20 MG capsule Take 20 mg by mouth daily.      . risperiDONE (RISPERDAL) 1 MG tablet Take 1 mg by mouth at bedtime.       . traZODone (DESYREL) 50 MG tablet Take 50 mg by mouth at bedtime.      Marland Kitchen aspirin 81 MG chewable tablet Chew 81 mg by mouth every morning.      . Cholecalciferol (VITAMIN D3) 1000 UNITS CAPS Take 1,000 Units by mouth every morning.       . Melatonin 1 MG TABS Take 1 mg by mouth at bedtime.         No Known Allergies  Past Medical History  Diagnosis Date  . Abdominal mass 02-16-12    02-20-12 surgery planned -Dr. Alycia Rossetti  . Hypertension   . Dementia since 2010  . Stroke 02-16-12    remains with rt. side weakness-uses right hand   . Diabetes mellitus 02-16-12    oral meds only  . GERD (gastroesophageal reflux disease) 02-16-12    tx. omeprazole    Past Surgical History  Procedure Date  . Laparotomy 02/20/2012    Procedure: EXPLORATORY LAPAROTOMY;  Surgeon: Imagene Gurney A. Alycia Rossetti, MD;  Location: [facility];  Service: Gynecology;  Laterality: N/A;  . Abdominal hysterectomy 02/20/2012    Procedure: HYSTERECTOMY ABDOMINAL;  Surgeon: Imagene Gurney A. Alycia Rossetti, MD;  Location: [facility];  Service: Gynecology;  Laterality: N/A;  . Salpingoophorectomy 02/20/2012    Procedure: SALPINGO OOPHERECTOMY;  Surgeon: Imagene Gurney A. Alycia Rossetti, MD;  Location: [facility];  Service: Gynecology;  Laterality: Bilateral;    History   Social History  . Marital Status: Divorced    Spouse Name: N/A    Number of Children: 3  . Years of Education: N/A   Occupational History  . Not on file.   Social History Main Topics  . Smoking status: Never Smoker   . Smokeless tobacco: Never Used  . Alcohol Use: No  . Drug Use: No  . Sexually Active: No   Other Topics Concern  . Not on file   Social History Narrative  . No narrative on file  Family History  Problem Relation Age of Onset  . Heart disease      No family history per patient    ROS:  Complains of mild dyspnea and lower abdominal pain but no fevers or chills, productive cough, hemoptysis, dysphasia, odynophagia, melena, hematochezia, dysuria, hematuria, rash, seizure activity, orthopnea, PND, pedal edema, claudication. Remaining systems are negative.  Physical Exam:   Blood pressure 118/53, pulse 64, temperature 97.7 F (36.5 C), temperature source Oral, resp. rate 22, height 5\' 3"  (1.6 m), weight 147 lb 4.3 oz (66.8 kg), SpO2 98.00%.  General:  Well developed/well nourished in NAD Skin warm/dry Patient not depressed No peripheral clubbing Back-normal HEENT-normal/normal eyelids Neck supple/normal carotid upstroke bilaterally; no bruits; no JVD; no thyromegaly chest - mildly diminished breath sounds  and mild expiratory wheeze CV - RRR/normal S1 and S2; no murmurs, rubs or gallops;  PMI nondisplaced Abdomen -status post abdominal surgery with incision showing mild ecchymosis but no discharge. Mildly distended with hypoactive bowel sounds. Mild diffuse tenderness to palpation. 2+ femoral pulses, no bruits Ext-trace edema, no chords, 2+ DP Neuro-grossly nonfocal  ECG  Sinus rhythm with PACs, cannot rule out prior anterior infarct.  Results for orders placed during the hospital encounter of 02/20/12 (from the past 48 hour(s))  GLUCOSE, CAPILLARY     Status: Abnormal   Collection Time   02/22/12  4:43 PM      Component Value Range Comment   Glucose-Capillary 127 (*) 70 - 99 mg/dL   URINE CULTURE     Status: Normal (Preliminary result)   Collection Time   02/22/12  5:38 PM      Component Value Range Comment   Specimen Description URINE, CATHETERIZED      Special Requests Normal      Culture  Setup Time 02/23/2012 01:09      Colony Count >=100,000 COLONIES/ML      Culture GRAM NEGATIVE RODS      Report Status PENDING     URINALYSIS, ROUTINE W REFLEX MICROSCOPIC     Status: Abnormal   Collection Time   02/22/12  5:38 PM      Component Value Range Comment   Color, Urine AMBER (*) YELLOW BIOCHEMICALS MAY BE AFFECTED BY COLOR   APPearance CLOUDY (*) CLEAR    Specific Gravity, Urine 1.046 (*) 1.005 - 1.030    pH 6.0  5.0 - 8.0    Glucose, UA NEGATIVE  NEGATIVE mg/dL    Hgb urine dipstick NEGATIVE  NEGATIVE    Bilirubin Urine SMALL (*) NEGATIVE    Ketones, ur TRACE (*) NEGATIVE mg/dL    Protein, ur 100 (*) NEGATIVE mg/dL    Urobilinogen, UA 0.2  0.0 - 1.0 mg/dL    Nitrite NEGATIVE  NEGATIVE    Leukocytes, UA TRACE (*) NEGATIVE   URINE MICROSCOPIC-ADD ON     Status: Abnormal   Collection Time   02/22/12  5:38 PM      Component Value Range Comment   Squamous Epithelial / LPF RARE  RARE    WBC, UA 7-10  <3 WBC/hpf    Bacteria, UA FEW (*) RARE    Casts GRANULAR CAST (*) NEGATIVE  HYALINE CASTS   Urine-Other MUCOUS PRESENT     GLUCOSE, CAPILLARY     Status: Abnormal   Collection Time   02/22/12  7:54 PM      Component Value Range Comment   Glucose-Capillary 160 (*) 70 - 99 mg/dL    Comment 1 Notify RN  GLUCOSE, CAPILLARY     Status: Abnormal   Collection Time   02/22/12 11:45 PM      Component Value Range Comment   Glucose-Capillary 210 (*) 70 - 99 mg/dL    Comment 1 Notify RN     GLUCOSE, CAPILLARY     Status: Abnormal   Collection Time   02/23/12  3:53 AM      Component Value Range Comment   Glucose-Capillary 179 (*) 70 - 99 mg/dL    Comment 1 Notify RN     BASIC METABOLIC PANEL     Status: Abnormal   Collection Time   02/23/12  4:26 AM      Component Value Range Comment   Sodium 135  135 - 145 mEq/L    Potassium 4.0  3.5 - 5.1 mEq/L    Chloride 97  96 - 112 mEq/L    CO2 26  19 - 32 mEq/L    Glucose, Bld 195 (*) 70 - 99 mg/dL    BUN 17  6 - 23 mg/dL    Creatinine, Ser 0.64  0.50 - 1.10 mg/dL    Calcium 9.3  8.4 - 10.5 mg/dL    GFR calc non Af Amer 87 (*) >90 mL/min    GFR calc Af Amer >90  >90 mL/min   GLUCOSE, CAPILLARY     Status: Abnormal   Collection Time   02/23/12  7:40 AM      Component Value Range Comment   Glucose-Capillary 202 (*) 70 - 99 mg/dL   GLUCOSE, CAPILLARY     Status: Abnormal   Collection Time   02/23/12  1:20 PM      Component Value Range Comment   Glucose-Capillary 172 (*) 70 - 99 mg/dL   URINALYSIS, ROUTINE W REFLEX MICROSCOPIC     Status: Abnormal   Collection Time   02/23/12  3:05 PM      Component Value Range Comment   Color, Urine AMBER (*) YELLOW BIOCHEMICALS MAY BE AFFECTED BY COLOR   APPearance CLOUDY (*) CLEAR    Specific Gravity, Urine >1.046 (*) 1.005 - 1.030    pH 6.0  5.0 - 8.0    Glucose, UA NEGATIVE  NEGATIVE mg/dL    Hgb urine dipstick LARGE (*) NEGATIVE    Bilirubin Urine SMALL (*) NEGATIVE    Ketones, ur TRACE (*) NEGATIVE mg/dL    Protein, ur >300 (*) NEGATIVE mg/dL    Urobilinogen, UA 0.2  0.0 -  1.0 mg/dL    Nitrite NEGATIVE  NEGATIVE    Leukocytes, UA SMALL (*) NEGATIVE   URINE MICROSCOPIC-ADD ON     Status: Abnormal   Collection Time   02/23/12  3:05 PM      Component Value Range Comment   WBC, UA 7-10  <3 WBC/hpf    RBC / HPF TOO NUMEROUS TO COUNT  <3 RBC/hpf    Bacteria, UA MANY (*) RARE    Urine-Other MUCOUS PRESENT     CBC     Status: Abnormal   Collection Time   02/23/12  3:55 PM      Component Value Range Comment   WBC 21.7 (*) 4.0 - 10.5 K/uL    RBC 3.96  3.87 - 5.11 MIL/uL    Hemoglobin 11.7 (*) 12.0 - 15.0 g/dL    HCT 34.2 (*) 36.0 - 46.0 %    MCV 86.4  78.0 - 100.0 fL    MCH 29.5  26.0 - 34.0 pg  MCHC 34.2  30.0 - 36.0 g/dL    RDW 13.0  11.5 - 15.5 %    Platelets 346  150 - 400 K/uL   DIFFERENTIAL     Status: Abnormal   Collection Time   02/23/12  3:55 PM      Component Value Range Comment   Neutrophils Relative 87 (*) 43 - 77 %    Neutro Abs 18.4 (*) 1.7 - 7.7 K/uL    Lymphocytes Relative 3 (*) 12 - 46 %    Lymphs Abs 0.7  0.7 - 4.0 K/uL    Monocytes Relative 10  3 - 12 %    Monocytes Absolute 2.0 (*) 0.1 - 1.0 K/uL    Eosinophils Relative 0  0 - 5 %    Eosinophils Absolute 0.0  0.0 - 0.7 K/uL    Basophils Relative 0  0 - 1 %    Basophils Absolute 0.0  0.0 - 0.1 K/uL   GLUCOSE, CAPILLARY     Status: Abnormal   Collection Time   02/23/12  4:19 PM      Component Value Range Comment   Glucose-Capillary 163 (*) 70 - 99 mg/dL   BASIC METABOLIC PANEL     Status: Abnormal   Collection Time   02/23/12  5:44 PM      Component Value Range Comment   Sodium 133 (*) 135 - 145 mEq/L    Potassium 4.3  3.5 - 5.1 mEq/L    Chloride 100  96 - 112 mEq/L    CO2 23  19 - 32 mEq/L    Glucose, Bld 160 (*) 70 - 99 mg/dL    BUN 17  6 - 23 mg/dL    Creatinine, Ser 0.60  0.50 - 1.10 mg/dL    Calcium 8.8  8.4 - 10.5 mg/dL    GFR calc non Af Amer 89 (*) >90 mL/min    GFR calc Af Amer >90  >90 mL/min   LACTIC ACID, PLASMA     Status: Normal   Collection Time   02/23/12   7:21 PM      Component Value Range Comment   Lactic Acid, Venous 1.7  0.5 - 2.2 mmol/L   GLUCOSE, CAPILLARY     Status: Abnormal   Collection Time   02/23/12  8:13 PM      Component Value Range Comment   Glucose-Capillary 111 (*) 70 - 99 mg/dL   GLUCOSE, CAPILLARY     Status: Abnormal   Collection Time   02/24/12 12:30 AM      Component Value Range Comment   Glucose-Capillary 181 (*) 70 - 99 mg/dL   GLUCOSE, CAPILLARY     Status: Abnormal   Collection Time   02/24/12  4:20 AM      Component Value Range Comment   Glucose-Capillary 148 (*) 70 - 99 mg/dL   CBC     Status: Abnormal   Collection Time   02/24/12  4:25 AM      Component Value Range Comment   WBC 12.6 (*) 4.0 - 10.5 K/uL    RBC 3.65 (*) 3.87 - 5.11 MIL/uL    Hemoglobin 10.8 (*) 12.0 - 15.0 g/dL    HCT 31.9 (*) 36.0 - 46.0 %    MCV 87.4  78.0 - 100.0 fL    MCH 29.6  26.0 - 34.0 pg    MCHC 33.9  30.0 - 36.0 g/dL    RDW 13.3  11.5 - 15.5 %  Platelets 348  150 - 400 K/uL   BASIC METABOLIC PANEL     Status: Abnormal   Collection Time   02/24/12  4:25 AM      Component Value Range Comment   Sodium 135  135 - 145 mEq/L    Potassium 4.9  3.5 - 5.1 mEq/L    Chloride 104  96 - 112 mEq/L    CO2 23  19 - 32 mEq/L    Glucose, Bld 162 (*) 70 - 99 mg/dL    BUN 18  6 - 23 mg/dL    Creatinine, Ser 0.62  0.50 - 1.10 mg/dL    Calcium 8.3 (*) 8.4 - 10.5 mg/dL    GFR calc non Af Amer 88 (*) >90 mL/min    GFR calc Af Amer >90  >90 mL/min   GLUCOSE, CAPILLARY     Status: Abnormal   Collection Time   02/24/12  8:27 AM      Component Value Range Comment   Glucose-Capillary 167 (*) 70 - 99 mg/dL    Comment 1 Documented in Chart      Comment 2 Notify RN       Ct Angio Chest Pe W/cm &/or Wo Cm  02/23/2012  *RADIOLOGY REPORT*  Clinical Data: Postop short of breath and wheezing .  Concern for pulmonary embolism.  CT ANGIOGRAPHY CHEST  Technique:  Multidetector CT imaging of the chest using the standard protocol during bolus  administration of intravenous contrast. Multiplanar reconstructed images including MIPs were obtained and reviewed to evaluate the vascular anatomy.  Contrast: 78mL OMNIPAQUE IOHEXOL 350 MG/ML SOLN  Comparison: Chest radiograph 02/23/2012  Findings: Exam is degraded by respiratory motion which limits the evaluation of the lower lobe segmental pulmonary arteries.  There are no filling defects within the main pulmonary arteries or proximal interlobar pulmonary arteries.  No acute findings of the aorta or great vessels.  No pericardial fluid.  Review of the lung parenchyma demonstrates mild medial atelectasis at the left lung base.  Again there is significant respiratory motion at the lung bases.  No pneumothorax.  No pulmonary edema.  No axillary or supraclavicular lymphadenopathy.  A feeding tube extends into the stomach.  Limited view of the upper abdomen demonstrates a small amount of intraperitoneal free fluid along the right hepatic lobe and beneath the left hemidiaphragm.  Limited view of the skeleton demonstrates degenerative spurring.  IMPRESSION:  1.  No evidence of central pulmonary embolism.  Suboptimal evaluation of the lower lobe segmental pulmonary arteries due to respiratory motion. If high clinical concern, consider VQ scan.  2.  No evidence of pneumothorax or pulmonary edema. 3.  Small amount of intraperitoneal free fluid.   Original Report Authenticated By: Suzy Bouchard, M.D.    Ct Abdomen Pelvis W Contrast  02/23/2012  *RADIOLOGY REPORT*  Clinical Data: Postop, abdominal distension, suspected ileus, evaluate for obstruction  CT ABDOMEN AND PELVIS WITH CONTRAST  Technique:  Multidetector CT imaging of the abdomen and pelvis was performed following the standard protocol during bolus administration of intravenous contrast.  Contrast: 163mL OMNIPAQUE IOHEXOL 300 MG/ML  SOLN  Comparison: Abdominal radiograph dated 02/22/2012.  CT abdomen pelvis dated 01/05/2012  Findings: Lung bases are essentially  clear.  Liver, spleen, pancreas, and adrenal glands are within normal limits.  Gallbladder is underdistended.  Suspected cholelithiasis, without associated inflammatory changes.  No intrahepatic or extrahepatic ductal dilatation.  Kidneys are within normal limits.  No hydronephrosis.  Enteric tube terminates in the region of the  pylorus.  Dilated loops of small and large bowel.  A few loops of distal colon in the left mid abdomen and right pelvis are decompressed.  No definite focal transition is seen.  The overall appearance remains most suggestive of adynamic ileus in the recent postoperative setting.  Atherosclerotic calcifications of the abdominal aorta and branch vessels.  Small to moderate to abdominopelvic ascites with a small amount of layering hemorrhage (series 2/image 77), likely postoperative.  No suspicious abdominopelvic lymphadenopathy.  Status post hysterectomy and bilateral salpingo-oophorectomy.  Bladder is decompressed by an indwelling Foley catheter.  Postsurgical changes in the midline anterior abdominal wall with overlying skin staples.  Degenerative changes of the visualized thoracolumbar spine.  IMPRESSION: Multiple dilated loops of small and large bowel, as described above.  The appearance remains most compatible with adynamic ileus in the recent postoperative setting. Follow-up radiographs are suggested.  Status post hysterectomy and bilateral salpingo-oophorectomy. Associated postsurgical changes with abdominopelvic ascites and a small amount of layering hemorrhage.   Original Report Authenticated By: Julian Hy, M.D.    Dg Chest Port 1 View  02/23/2012  *RADIOLOGY REPORT*  Clinical Data: Laparotomy 3 days ago, now with wheezing.  PORTABLE CHEST - 1 VIEW  Comparison: Two-view chest x-ray 02/16/2012.  Findings: Nasogastric tube courses below the diaphragm into the stomach.  Suboptimal inspiration with atelectasis at the left lung base.  Lungs remain clear otherwise.  Cardiac  silhouette upper normal in size to slightly enlarged but stable.  Pulmonary vascularity normal.  IMPRESSION: Suboptimal inspiration with atelectasis at the left lung base.  No acute cardiopulmonary disease otherwise.   Original Report Authenticated By: Deniece Portela, M.D.    Dg Abd Portable 1v  02/22/2012  *RADIOLOGY REPORT*  Clinical Data: 71 year old female postoperative abdominal pain and distention.  PORTABLE ABDOMEN - 1 VIEW  Comparison: Preoperative CT abdomen pelvis 01/05/2012.  Findings: Portable supine AP view 1750 hours.  Midline skin staples.  Gas throughout small and large bowel loops, all measuring at the upper limits of normal to mildly dilated.  Left gas in the distal colon.  Granular opacity in the lower abdomen pelvis might reflect retained stool, uncertain.  No definite pneumoperitoneum on this supine view.  IMPRESSION: 1.  Gas pattern suggestive of postoperative ileus.  Given little distal bowel gas, recommend follow-up study to exclude developing distal obstruction. 2.  Granular opacity in the lower abdomen pelvis might reflect retained stool.   Original Report Authenticated By: Randall An, M.D.     Assessment/Plan #1 tachycardia-I have reviewed the patient's electrocardiograms. She has sinus rhythm with PACs. She is presently not on her beta blocker because she is n.p.o. For postoperative ileus. I agree with IV metoprolol until she is taking by mouth medications. I will change dose to 5 mg IV every 6 hours. Would transition back to Coreg by mouth when she is able. #2 postoperative ileus-management per GYN. #3 status post hysterectomy-management per GYN. #4 hypertension-continue IV Lopressor until patient tolerating by mouth intake. Within resume home medications. #5 postoperative volume excess-patient received 1 dose of IV Lasix today. I agree with this. Follow volume status and decrease IV fluids as needed. Kirk Ruths MD 02/24/2012, 2:15 PM

## 2012-02-24 NOTE — Consult Note (Signed)
Reason for Consult:ileus Referring Physician: Shanya Armwood is an 71 y.o. female.  HPI: She is s/p open TAH and BSO on 9/24.  Yesterday, she developed an ileus with nausea and vomiting and a wbc to 21.  A CT was performed which showed multiple dilated loops of small bowel.  A lactate was normal.  She had a positive urine culture for which she is currently not being treated.  Her NG has drained around 2 L and she feels better.  Her wbc is back down to 12 today.  She is still having some difficulty breathing.    Past Medical History  Diagnosis Date  . Abdominal mass 02-16-12    02-20-12 surgery planned -Dr. Alycia Rossetti  . Hypertension   . Dementia since 2010  . Stroke 02-16-12    remains with rt. side weakness-uses right hand  . Diabetes mellitus 02-16-12    oral meds only  . GERD (gastroesophageal reflux disease) 02-16-12    tx. omeprazole    Past Surgical History  Procedure Date  . Laparotomy 02/20/2012    Procedure: EXPLORATORY LAPAROTOMY;  Surgeon: Imagene Gurney A. Alycia Rossetti, MD;  Location: [facility];  Service: Gynecology;  Laterality: N/A;  . Abdominal hysterectomy 02/20/2012    Procedure: HYSTERECTOMY ABDOMINAL;  Surgeon: Imagene Gurney A. Alycia Rossetti, MD;  Location: [facility];  Service: Gynecology;  Laterality: N/A;  . Salpingoophorectomy 02/20/2012    Procedure: SALPINGO OOPHERECTOMY;  Surgeon: Imagene Gurney A. Alycia Rossetti, MD;  Location: [facility];  Service: Gynecology;  Laterality: Bilateral;    History reviewed. No pertinent family history.  Social History:  reports that she has never smoked. She has never used smokeless tobacco. She reports that she does not drink alcohol or use illicit drugs.  Allergies: No Known Allergies  Medications:  Scheduled:   . amLODipine  5 mg Oral Daily  . benazepril  20 mg Oral Daily  . carvedilol  12.5 mg Oral BID WC  . enoxaparin (LOVENOX) injection  40 mg Subcutaneous Q24H  . HYDROmorphone PCA 0.3 mg/mL   Intravenous Q4H  . insulin aspart  0-15 Units Subcutaneous Q4H    . pantoprazole (PROTONIX) IV  40 mg Intravenous Q24H  . risperiDONE  1 mg Oral QHS  . sodium chloride  500 mL Intravenous Once  . traZODone  50 mg Oral QHS  . DISCONTD: enoxaparin (LOVENOX) injection  40 mg Subcutaneous Q24H  . DISCONTD: pantoprazole  40 mg Oral Daily   Lab Results  Component Value Date   WBC 12.6* 02/24/2012   HGB 10.8* 02/24/2012   HCT 31.9* 02/24/2012   MCV 87.4 02/24/2012   PLT 348 02/24/2012   Lab Results  Component Value Date   CREATININE 0.62 02/24/2012    Ct Angio Chest Pe W/cm &/or Wo Cm  02/23/2012  *RADIOLOGY REPORT*  Clinical Data: Postop short of breath and wheezing .  Concern for pulmonary embolism.  CT ANGIOGRAPHY CHEST  Technique:  Multidetector CT imaging of the chest using the standard protocol during bolus administration of intravenous contrast. Multiplanar reconstructed images including MIPs were obtained and reviewed to evaluate the vascular anatomy.  Contrast: 58mL OMNIPAQUE IOHEXOL 350 MG/ML SOLN  Comparison: Chest radiograph 02/23/2012  Findings: Exam is degraded by respiratory motion which limits the evaluation of the lower lobe segmental pulmonary arteries.  There are no filling defects within the main pulmonary arteries or proximal interlobar pulmonary arteries.  No acute findings of the aorta or great vessels.  No pericardial fluid.  Review of the lung parenchyma demonstrates  mild medial atelectasis at the left lung base.  Again there is significant respiratory motion at the lung bases.  No pneumothorax.  No pulmonary edema.  No axillary or supraclavicular lymphadenopathy.  A feeding tube extends into the stomach.  Limited view of the upper abdomen demonstrates a small amount of intraperitoneal free fluid along the right hepatic lobe and beneath the left hemidiaphragm.  Limited view of the skeleton demonstrates degenerative spurring.  IMPRESSION:  1.  No evidence of central pulmonary embolism.  Suboptimal evaluation of the lower lobe segmental pulmonary  arteries due to respiratory motion. If high clinical concern, consider VQ scan.  2.  No evidence of pneumothorax or pulmonary edema. 3.  Small amount of intraperitoneal free fluid.   Original Report Authenticated By: Suzy Bouchard, M.D.    Ct Abdomen Pelvis W Contrast  02/23/2012  *RADIOLOGY REPORT*  Clinical Data: Postop, abdominal distension, suspected ileus, evaluate for obstruction  CT ABDOMEN AND PELVIS WITH CONTRAST  Technique:  Multidetector CT imaging of the abdomen and pelvis was performed following the standard protocol during bolus administration of intravenous contrast.  Contrast: 162mL OMNIPAQUE IOHEXOL 300 MG/ML  SOLN  Comparison: Abdominal radiograph dated 02/22/2012.  CT abdomen pelvis dated 01/05/2012  Findings: Lung bases are essentially clear.  Liver, spleen, pancreas, and adrenal glands are within normal limits.  Gallbladder is underdistended.  Suspected cholelithiasis, without associated inflammatory changes.  No intrahepatic or extrahepatic ductal dilatation.  Kidneys are within normal limits.  No hydronephrosis.  Enteric tube terminates in the region of the pylorus.  Dilated loops of small and large bowel.  A few loops of distal colon in the left mid abdomen and right pelvis are decompressed.  No definite focal transition is seen.  The overall appearance remains most suggestive of adynamic ileus in the recent postoperative setting.  Atherosclerotic calcifications of the abdominal aorta and branch vessels.  Small to moderate to abdominopelvic ascites with a small amount of layering hemorrhage (series 2/image 77), likely postoperative.  No suspicious abdominopelvic lymphadenopathy.  Status post hysterectomy and bilateral salpingo-oophorectomy.  Bladder is decompressed by an indwelling Foley catheter.  Postsurgical changes in the midline anterior abdominal wall with overlying skin staples.  Degenerative changes of the visualized thoracolumbar spine.  IMPRESSION: Multiple dilated loops of  small and large bowel, as described above.  The appearance remains most compatible with adynamic ileus in the recent postoperative setting. Follow-up radiographs are suggested.  Status post hysterectomy and bilateral salpingo-oophorectomy. Associated postsurgical changes with abdominopelvic ascites and a small amount of layering hemorrhage.   Original Report Authenticated By: Julian Hy, M.D.    Dg Chest Port 1 View  02/23/2012  *RADIOLOGY REPORT*  Clinical Data: Laparotomy 3 days ago, now with wheezing.  PORTABLE CHEST - 1 VIEW  Comparison: Two-view chest x-ray 02/16/2012.  Findings: Nasogastric tube courses below the diaphragm into the stomach.  Suboptimal inspiration with atelectasis at the left lung base.  Lungs remain clear otherwise.  Cardiac silhouette upper normal in size to slightly enlarged but stable.  Pulmonary vascularity normal.  IMPRESSION: Suboptimal inspiration with atelectasis at the left lung base.  No acute cardiopulmonary disease otherwise.   Original Report Authenticated By: Deniece Portela, M.D.    Dg Abd Portable 1v  02/22/2012  *RADIOLOGY REPORT*  Clinical Data: 71 year old female postoperative abdominal pain and distention.  PORTABLE ABDOMEN - 1 VIEW  Comparison: Preoperative CT abdomen pelvis 01/05/2012.  Findings: Portable supine AP view 1750 hours.  Midline skin staples.  Gas throughout small and large  bowel loops, all measuring at the upper limits of normal to mildly dilated.  Left gas in the distal colon.  Granular opacity in the lower abdomen pelvis might reflect retained stool, uncertain.  No definite pneumoperitoneum on this supine view.  IMPRESSION: 1.  Gas pattern suggestive of postoperative ileus.  Given little distal bowel gas, recommend follow-up study to exclude developing distal obstruction. 2.  Granular opacity in the lower abdomen pelvis might reflect retained stool.   Original Report Authenticated By: Randall An, M.D.     Review of Systems    Constitutional: Negative for fever and chills.  Respiratory: Positive for cough, shortness of breath and wheezing. Negative for hemoptysis.   Cardiovascular: Negative for chest pain and palpitations.  Gastrointestinal: Positive for nausea, vomiting and abdominal pain.  Genitourinary: Positive for dysuria. Negative for hematuria and flank pain.  Neurological: Positive for weakness.   Blood pressure 137/80, pulse 87, temperature 99 F (37.2 C), temperature source Oral, resp. rate 20, height 5\' 3"  (1.6 m), weight 147 lb 4.3 oz (66.8 kg), SpO2 99.00%. Physical Exam  Constitutional: She appears well-developed.  HENT:  Head: Normocephalic and atraumatic.  Eyes: Pupils are equal, round, and reactive to light.  Cardiovascular: Normal rate and regular rhythm.   Respiratory: No respiratory distress. She has wheezes.  GI: She exhibits distension. There is no tenderness.  Neurological: She is alert.  Skin: Skin is warm and dry.    Assessment/Plan: Post-operative ileus: continue NG to suction, keep electrolytes within normal levels, titrate fluids for good UOP, Agree with cardiology consult.  Abdomen nontender, lactate normal and wbc down so no indication for surgery right now.   UTI: started Cipro empirically, sensitivities pending  Natalie Rodgers C. [id number], [id number] AM

## 2012-02-24 NOTE — Progress Notes (Signed)
Dr. Jodi Mourning aware via phone [facility] MD rounding this am unaware of reported consult from previous shift. No order nor note to support consult. Dr. Jodi Mourning to notify consulting MD for [facility].

## 2012-02-24 NOTE — Progress Notes (Signed)
Transferred via bed to 1410 on [facility] telemetry unit. Accompanied by nurse x1 and NT. Updated report given to nurse Brianna,RN.

## 2012-02-24 NOTE — Progress Notes (Signed)
Dr. Jodi Mourning phoned unit to check on pt. Order received to dc Lovenox.

## 2012-02-24 NOTE — Progress Notes (Signed)
Report given to Denton Ar, RN prior to transfer to [employer] as ordered by MD.

## 2012-02-24 NOTE — Consult Note (Signed)
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 She was stable postop and tolerating some diet but progressively developed abdominal distention with leukocytosis on 9/27. She was also noted to be wheezy and a CT and you off the chest done was negative for PE or pulmonary edema. A CT abdomen and pelvis was done showing adynamic ileus. UA was positive for UTI but cultures growing gram-negative rods. Medical consult called for management of her ileus, hypertension diabetes and  UTI. On evaluation patient is not a good historian possibly in the setting of some underlying dementia. She is not aware about her diabetes or the medications she takes. She informs of some shortness of breath and abdominal discomfort. He denies any chest pain or palpitations. Denies any headache, blurry vision, nausea or vomiting, diarrhea, denies any new weakness.  Allergies:  No Known Allergies    Past Medical History  Diagnosis Date  . Abdominal mass 02-16-12    02-20-12 surgery planned -Dr. Alycia Rossetti  . Hypertension   . Dementia since 2010  . Stroke 02-16-12    remains with rt. side weakness-uses right hand  . Diabetes mellitus 02-16-12    oral meds only  . GERD (gastroesophageal reflux disease) 02-16-12    tx. omeprazole    Past Surgical History  Procedure Date  . Laparotomy 02/20/2012    Procedure: EXPLORATORY LAPAROTOMY;  Surgeon: Imagene Gurney A. Alycia Rossetti, MD;  Location: [facility];  Service: Gynecology;   Laterality: N/A;  . Abdominal hysterectomy 02/20/2012    Procedure: HYSTERECTOMY ABDOMINAL;  Surgeon: Imagene Gurney A. Alycia Rossetti, MD;  Location: [facility];  Service: Gynecology;  Laterality: N/A;  . Salpingoophorectomy 02/20/2012    Procedure: SALPINGO OOPHERECTOMY;  Surgeon: Imagene Gurney A. Alycia Rossetti, MD;  Location: [facility];  Service: Gynecology;  Laterality: Bilateral;    Medications:  Scheduled Meds:   . albuterol  2.5 mg Nebulization Q6H  . amLODipine  5 mg Oral Daily  . benazepril  20 mg Oral Daily  . carvedilol  12.5 mg Oral BID WC  . ciprofloxacin  400 mg Intravenous Q12H  . furosemide  40 mg Intravenous Once  . HYDROmorphone PCA 0.3 mg/mL   Intravenous Q4H  . insulin aspart  0-15 Units Subcutaneous Q4H  . pantoprazole (PROTONIX) IV  40 mg Intravenous Q24H  . risperiDONE  1 mg Oral QHS  . sodium chloride  500 mL Intravenous Once  . traZODone  50 mg Oral QHS  . DISCONTD: enoxaparin (LOVENOX) injection  40 mg Subcutaneous Q24H  . DISCONTD: enoxaparin (LOVENOX) injection  40 mg Subcutaneous Q24H   Continuous Infusions:   . sodium chloride 50 mL/hr at 02/23/12 1614  . dextrose 5 %-0.9% nacl with kcl 150 mL/hr at 02/24/12 0245  . DISCONTD: dextrose 5 %-0.9% nacl with kcl 125 mL/hr at 02/23/12 1310   PRN Meds:.bisacodyl, diphenhydrAMINE, diphenhydrAMINE, iohexol, iohexol, naloxone, ondansetron (ZOFRAN) IV, ondansetron, sodium chloride, DISCONTD: oxyCODONE-acetaminophen  Social History:  reports that she has never smoked. She has never used smokeless tobacco. She reports that she does not drink alcohol or use illicit drugs.  History reviewed. No pertinent family  history.  Review of Systems:  Constitutional: Denies fever, chills, diaphoresis, appetite change and fatigue.  HEENT: Denies photophobia, eye pain, redness, hearing loss, ear pain, congestion, sore throat, rhinorrhea, sneezing, mouth sores, trouble swallowing, neck pain, neck stiffness and tinnitus.   Respiratory: Has some shortness of breath,  denies cough, chest tightness,  and wheezing.   Cardiovascular: Denies chest pain, palpitations and leg swelling.  Gastrointestinal: Denies nausea, vomiting, abdominal pain, denies diarrhea, constipation, blood in stool and abdominal distention.  Genitourinary: Denies dysuria, urgency, frequency, hematuria, flank pain and difficulty urinating.  Musculoskeletal: Denies myalgias, back pain, joint swelling, arthralgias and gait problem.  Skin: Denies pallor, rash and wound.  Neurological: Denies dizziness, seizures, syncope, weakness, light-headedness, numbness and headaches.  Hematological: Denies adenopathy. Easy bruising, personal or family bleeding history  Psychiatric/Behavioral: Denies suicidal ideation, mood changes, confusion, nervousness, sleep disturbance and agitation   Physical Exam:  Filed Vitals:   02/24/12 0800 02/24/12 0918 02/24/12 1000 02/24/12 1042  BP:  132/70  146/72  Pulse:  82 90 91  Temp:    99.5 F (37.5 C)  TempSrc:    Oral  Resp: 24 26  24   Height:      Weight:      SpO2: 100% 100%  100%     Intake/Output Summary (Last 24 hours) at 02/24/12 1123 Last data filed at 02/24/12 0600  Gross per 24 hour  Intake 1088.33 ml  Output   3025 ml  Net -1936.67 ml    General: Elderly female lying in bed in no acute distress HEENT: No pallor, no icterus, moist oral mucosa, NG in place draining bilious fluid. Heart: Regular rate and rhythm, without murmurs, rubs, gallops. Lungs: Scattered rhonchi bilaterally Abdomen: Distended, laparotomy staples intact, no bowel sounds Extremities: No clubbing cyanosis or edema with positive pedal pulses. Neuro: Grossly intact, nonfocal.  Labs on Admission:  CBC:    Component Value Date/Time   WBC 12.6* 02/24/2012 0425   HGB 10.8* 02/24/2012 0425   HCT 31.9* 02/24/2012 0425   PLT 348 02/24/2012 0425   MCV 87.4 02/24/2012 0425   NEUTROABS 18.4* 02/23/2012 1555   LYMPHSABS 0.7 02/23/2012 1555   MONOABS 2.0* 02/23/2012 1555    EOSABS 0.0 02/23/2012 1555   BASOSABS 0.0 02/23/2012 99991111    Basic Metabolic Panel:    Component Value Date/Time   NA 135 02/24/2012 0425   K 4.9 02/24/2012 0425   CL 104 02/24/2012 0425   CO2 23 02/24/2012 0425   BUN 18 02/24/2012 0425   CREATININE 0.62 02/24/2012 0425   CREATININE 0.62 01/02/2012 1258   GLUCOSE 162* 02/24/2012 0425   CALCIUM 8.3* 02/24/2012 0425    Radiological Exams on Admission: Ct Angio Chest Pe W/cm &/or Wo Cm  02/23/2012  *RADIOLOGY REPORT*  Clinical Data: Postop short of breath and wheezing .  Concern for pulmonary embolism.  CT ANGIOGRAPHY CHEST  Technique:  Multidetector CT imaging of the chest using the standard protocol during bolus administration of intravenous contrast. Multiplanar reconstructed images including MIPs were obtained and reviewed to evaluate the vascular anatomy.  Contrast: 63mL OMNIPAQUE IOHEXOL 350 MG/ML SOLN  Comparison: Chest radiograph 02/23/2012  Findings: Exam is degraded by respiratory motion which limits the evaluation of the lower lobe segmental pulmonary arteries.  There are no filling defects within the main pulmonary arteries or proximal interlobar pulmonary arteries.  No acute findings of the aorta or great vessels.  No pericardial fluid.  Review of the lung parenchyma demonstrates mild medial atelectasis at  the left lung base.  Again there is significant respiratory motion at the lung bases.  No pneumothorax.  No pulmonary edema.  No axillary or supraclavicular lymphadenopathy.  A feeding tube extends into the stomach.  Limited view of the upper abdomen demonstrates a small amount of intraperitoneal free fluid along the right hepatic lobe and beneath the left hemidiaphragm.  Limited view of the skeleton demonstrates degenerative spurring.  IMPRESSION:  1.  No evidence of central pulmonary embolism.  Suboptimal evaluation of the lower lobe segmental pulmonary arteries due to respiratory motion. If high clinical concern, consider VQ scan.  2.  No  evidence of pneumothorax or pulmonary edema. 3.  Small amount of intraperitoneal free fluid.   Original Report Authenticated By: Suzy Bouchard, M.D.    Ct Abdomen Pelvis W Contrast  02/23/2012  *RADIOLOGY REPORT*  Clinical Data: Postop, abdominal distension, suspected ileus, evaluate for obstruction  CT ABDOMEN AND PELVIS WITH CONTRAST  Technique:  Multidetector CT imaging of the abdomen and pelvis was performed following the standard protocol during bolus administration of intravenous contrast.  Contrast: 168mL OMNIPAQUE IOHEXOL 300 MG/ML  SOLN  Comparison: Abdominal radiograph dated 02/22/2012.  CT abdomen pelvis dated 01/05/2012  Findings: Lung bases are essentially clear.  Liver, spleen, pancreas, and adrenal glands are within normal limits.  Gallbladder is underdistended.  Suspected cholelithiasis, without associated inflammatory changes.  No intrahepatic or extrahepatic ductal dilatation.  Kidneys are within normal limits.  No hydronephrosis.  Enteric tube terminates in the region of the pylorus.  Dilated loops of small and large bowel.  A few loops of distal colon in the left mid abdomen and right pelvis are decompressed.  No definite focal transition is seen.  The overall appearance remains most suggestive of adynamic ileus in the recent postoperative setting.  Atherosclerotic calcifications of the abdominal aorta and branch vessels.  Small to moderate to abdominopelvic ascites with a small amount of layering hemorrhage (series 2/image 77), likely postoperative.  No suspicious abdominopelvic lymphadenopathy.  Status post hysterectomy and bilateral salpingo-oophorectomy.  Bladder is decompressed by an indwelling Foley catheter.  Postsurgical changes in the midline anterior abdominal wall with overlying skin staples.  Degenerative changes of the visualized thoracolumbar spine.  IMPRESSION: Multiple dilated loops of small and large bowel, as described above.  The appearance remains most compatible with  adynamic ileus in the recent postoperative setting. Follow-up radiographs are suggested.  Status post hysterectomy and bilateral salpingo-oophorectomy. Associated postsurgical changes with abdominopelvic ascites and a small amount of layering hemorrhage.   Original Report Authenticated By: Julian Hy, M.D.    Dg Chest Port 1 View  02/23/2012  *RADIOLOGY REPORT*  Clinical Data: Laparotomy 3 days ago, now with wheezing.  PORTABLE CHEST - 1 VIEW  Comparison: Two-view chest x-ray 02/16/2012.  Findings: Nasogastric tube courses below the diaphragm into the stomach.  Suboptimal inspiration with atelectasis at the left lung base.  Lungs remain clear otherwise.  Cardiac silhouette upper normal in size to slightly enlarged but stable.  Pulmonary vascularity normal.  IMPRESSION: Suboptimal inspiration with atelectasis at the left lung base.  No acute cardiopulmonary disease otherwise.   Original Report Authenticated By: Deniece Portela, M.D.    Dg Abd Portable 1v  02/22/2012  *RADIOLOGY REPORT*  Clinical Data: 71 year old female postoperative abdominal pain and distention.  PORTABLE ABDOMEN - 1 VIEW  Comparison: Preoperative CT abdomen pelvis 01/05/2012.  Findings: Portable supine AP view 1750 hours.  Midline skin staples.  Gas throughout small and large bowel loops, all measuring  at the upper limits of normal to mildly dilated.  Left gas in the distal colon.  Granular opacity in the lower abdomen pelvis might reflect retained stool, uncertain.  No definite pneumoperitoneum on this supine view.  IMPRESSION: 1.  Gas pattern suggestive of postoperative ileus.  Given little distal bowel gas, recommend follow-up study to exclude developing distal obstruction. 2.  Granular opacity in the lower abdomen pelvis might reflect retained stool.   Original Report Authenticated By: Randall An, M.D.     Assessment/Plan Acute ileus Patient has a persistent abdominal distention last 1 day with imaging showing  ileus. Patient n.p.o. for now with about 800 cc of bilious fluid drained via NG tube. Possibly secondary secondary to poor mobilization. and narcotics. -Monitor serial abdominal exam. Her abdomen is grossly distended at this time. Check repeat x-ray in the morning. -Patient on morphine PCA. Recommend primary team to titrate to scheduled or when necessary pain medication if tolerated.  Shortness of breath Patient noted to be wheezy and? Volume overloaded as per primary team. Chest x-ray unremarkable. She also had a CT and you done on 927 which was negative for PE. -Heart rate currently stable and maintaining O2 sat on nasal cannula. A 12-lead EKG done this morning unremarkable except for few PACs. -She has scattered rhonchi on exam but cannot appreciate any long crackles. IV Lasix 40 mg times one has been ordered by primary team and will monitor for her urine output. She has been getting IV normal saline at 125 cc an hour. I will switch the fluids to D5 normal saline at 75 cc an hour. Monitor urine output after Lasix dose. -Transfer to telemetry -I will place her on scheduled albuterol nebs. Continue bedside spirometry.  UTI Noted for leukocytosis on 9/27 which is slowly improving. Patient is currently afebrile. Urine culture growing gram-negative rods. Patient has been started on IV ciprofloxacin which can be continued. Follow with sensitivity.  Diabetes mellitus Patient on Glucophage and Amaryl at home which has been held. I will place her on sliding scale insulin.   Hypertension She is on amlodipine, carvedilol and ARB with well controlled blood pressure. Currently n.p.o. and I will place her on on as needed IV hydralazine and Lopressor IV 5 mg twice a day. (Her home blood pressure medication is Lotrel, Coreg, and HCTZ all of which are old and because of ileus)  GERD Continue IV Protonix   Thank you for allowing [place] to participate in patient's care. Medical team will continue to  follow.  I Have discussed the plan with Dr. Jodi Mourning.  Time Spent on Admission: 70 minutes  Natalie Rodgers 02/24/2012, 11:23 AM

## 2012-02-24 NOTE — Progress Notes (Signed)
Dr. Delena Serve aware via phone pt's BP 113/63 HR 73. See new orders entered by MD in EPIC. Pt resting. Preparing to transfer to [facility]  Room 1410.

## 2012-02-25 ENCOUNTER — Inpatient Hospital Stay (HOSPITAL_COMMUNITY): Payer: Medicare Other

## 2012-02-25 DIAGNOSIS — I10 Essential (primary) hypertension: Secondary | ICD-10-CM

## 2012-02-25 LAB — CBC
MCV: 88.3 fL (ref 78.0–100.0)
Platelets: 328 10*3/uL (ref 150–400)
RBC: 3.17 MIL/uL — ABNORMAL LOW (ref 3.87–5.11)
WBC: 10.1 10*3/uL (ref 4.0–10.5)

## 2012-02-25 LAB — GLUCOSE, CAPILLARY
Glucose-Capillary: 123 mg/dL — ABNORMAL HIGH (ref 70–99)
Glucose-Capillary: 125 mg/dL — ABNORMAL HIGH (ref 70–99)
Glucose-Capillary: 133 mg/dL — ABNORMAL HIGH (ref 70–99)
Glucose-Capillary: 134 mg/dL — ABNORMAL HIGH (ref 70–99)

## 2012-02-25 LAB — URINE CULTURE: Special Requests: NORMAL

## 2012-02-25 LAB — BASIC METABOLIC PANEL
CO2: 24 mEq/L (ref 19–32)
Chloride: 103 mEq/L (ref 96–112)
Creatinine, Ser: 0.63 mg/dL (ref 0.50–1.10)
Potassium: 4.4 mEq/L (ref 3.5–5.1)

## 2012-02-25 MED ORDER — FUROSEMIDE 10 MG/ML IJ SOLN
40.0000 mg | Freq: Once | INTRAMUSCULAR | Status: AC
  Administered 2012-02-25: 40 mg via INTRAVENOUS
  Filled 2012-02-25: qty 4

## 2012-02-25 MED ORDER — BIOTENE DRY MOUTH MT LIQD
15.0000 mL | Freq: Two times a day (BID) | OROMUCOSAL | Status: DC
  Administered 2012-02-25 – 2012-02-29 (×10): 15 mL via OROMUCOSAL

## 2012-02-25 MED ORDER — CHLORHEXIDINE GLUCONATE 0.12 % MT SOLN
15.0000 mL | Freq: Two times a day (BID) | OROMUCOSAL | Status: DC
  Administered 2012-02-25 – 2012-03-01 (×11): 15 mL via OROMUCOSAL
  Filled 2012-02-25 (×13): qty 15

## 2012-02-25 NOTE — Progress Notes (Signed)
@   Subjective:  Denies CP or dyspnea   Objective:  Filed Vitals:   02/25/12 0153 02/25/12 0400 02/25/12 0401 02/25/12 0651  BP: 109/53   124/53  Pulse:    84  Temp:    98.4 F (36.9 C)  TempSrc:    Oral  Resp:  19 19 19   Height:      Weight:      SpO2:  100% 100% 100%    Intake/Output from previous day:  Intake/Output Summary (Last 24 hours) at 02/25/12 0749 Last data filed at 02/25/12 0700  Gross per 24 hour  Intake 5905.75 ml  Output   2025 ml  Net 3880.75 ml    Physical Exam: Physical exam: Well-developed well-nourished in no acute distress.  Skin is warm and dry.  HEENT is normal.  Neck is supple.  Chest with mild expiratory wheeze Cardiovascular exam is regular rate and rhythm.  Abdominal exam s/p surgery; mildly distended and mild tenderness to palpation Extremities show no edema. neuro grossly intact    Lab Results: Basic Metabolic Panel:  Basename 02/25/12 0527 02/24/12 0425  NA 134* 135  K 4.4 4.9  CL 103 104  CO2 24 23  GLUCOSE 120* 162*  BUN 17 18  CREATININE 0.63 0.62  CALCIUM 8.0* 8.3*  MG -- --  PHOS -- --   CBC:  Basename 02/25/12 0527 02/24/12 0425 02/23/12 1555  WBC 10.1 12.6* --  NEUTROABS -- -- 18.4*  HGB 9.5* 10.8* --  HCT 28.0* 31.9* --  MCV 88.3 87.4 --  PLT 328 348 --     Assessment/Plan:  #1 Tachycardia/pacs - telemetry reviewed; patient remains in sinus; continue IV lopressor; change to po coreg at preadmission dose when able to tolerate PO. #2 Volume excess - Patient presently receiving 2 IVF's; total 125 cc/hr; will DC NS with KCL; follow closely for volume excess and diurese as needed. #3 Post op ileus - per GYN #4 s/p hysterectomy - per GYN #5 HTN - when taking po, would resume preadmission BP meds Patient being followed by hospitalist service; remains in sinus; we will sign off; please call with questions    Kirk Ruths 02/25/2012, 7:49 AM

## 2012-02-25 NOTE — Plan of Care (Signed)
Problem: Phase II Progression Outcomes Goal: Incision/dressings dry and intact Outcome: Not Applicable Date Met:  [id number] Incision OTA  Problem: Problem: Bowel/Bladder Progression Goal: OTHER BOWEL GOAL(S) Outcome: Not Met (add Reason) NGT discontinued;good bowel sounds all 4 quads & normal BM, good appetite

## 2012-02-25 NOTE — Progress Notes (Signed)
[facility] PROGRESS NOTE  Natalie Rodgers [id number] DOB: Jul 25, 1940 DOA: 02/20/2012 PCP: Reymundo Poll, MD Brief narrative Medical consult for 71 y/o female with ileus post op ( abdominal hysterectomy with BSO) with post of ileus, UTI ad SOB  Assessment/Plan:  Acute post op ileus  Possibly secondary secondary to poor mobilization. and narcotics.  Patient n.p.o.with about 800 cc of bilious fluid drained via NG tube. -Monitor serial abdominal exam. Improving today. Repeat abd x ray shows some resolution of ileus -Patient on morphine PCA. Recommend primary team to titrate to scheduled or when necessary pain medication if tolerated.   Shortness of breath  Secondary to atelectasis with wheezing. Improved with nebs  o2 via [place]  PACs Improved. Stable on tle. Cont prn lopressor  UTI  Urine culture growing gram-negative rods. Patient has been started on IV ciprofloxacin which can be continued. (day 2)  Follow with sensitivity.   Diabetes mellitus  Patient on Glucophage and Amaryl at home which has been held. Continue  sliding scale insulin.   Hypertension  She is on amlodipine, carvedilol and ARB with well controlled blood pressure. Currently n.p.o. and I will place her on on as needed IV hydralazine and Lopressor  GERD  Continue IV Protonix   Hx of CVA with rt sided weakness ordered PT by primary   Code Status: full Family Communication: daughter at bedside           Antibiotics:  IV cipro ( day 2)  HPI/Subjective: Feels better today, no SOB. abdominal pain improving   Objective: Filed Vitals:   02/25/12 0401 02/25/12 0651 02/25/12 0800 02/25/12 0904  BP:  124/53    Pulse:  84    Temp:  98.4 F (36.9 C)    TempSrc:  Oral    Resp: 19 19 24    Height:      Weight:      SpO2: 100% 100% 100% 100%    Intake/Output Summary (Last 24 hours) at 02/25/12 0945 Last data filed at 02/25/12 0700  Gross per 24 hour  Intake 5805.75 ml  Output   2025 ml    Net 3780.75 ml   Filed Weights   02/20/12 1742  Weight: 66.8 kg (147 lb 4.3 oz)    Exam:  General: Elderly female lying in bed in no acute distress  HEENT: No pallor, no icterus, moist oral mucosa, NG in place draining bilious fluid. ( 800 cc past 24 hrs)  Heart: Regular rate and rhythm, without murmurs, rubs, gallops.  Lungs: clear b/l Abdomen: abdominal distention reduced, soft on exam, BS+., staples intact Extremities: No clubbing cyanosis or edema with positive pedal pulses.  Neuro: Grossly intact, nonfocal.   Data Reviewed: Basic Metabolic Panel:  Lab [id number] 02/24/12 0425 02/23/12 1744 02/23/12 0426 02/22/12 0442  NA 134* 135 133* 135 131*  K 4.4 4.9 4.3 4.0 3.4*  CL 103 104 100 97 95*  CO2 24 23 23 26 25   GLUCOSE 120* 162* 160* 195* 162*  BUN 17 18 17 17 9   CREATININE 0.63 0.62 0.60 0.64 0.58  CALCIUM 8.0* 8.3* 8.8 9.3 8.8  MG -- -- -- -- --  PHOS -- -- -- -- --   Liver Function Tests: No results found for this basename: AST:5,ALT:5,ALKPHOS:5,BILITOT:5,PROT:5,ALBUMIN:5 in the last 168 hours No results found for this basename: LIPASE:5,AMYLASE:5 in the last 168 hours No results found for this basename: AMMONIA:5 in the last 168 hours CBC:  Lab 02/25/12 0527 02/24/12 0425 02/23/12 1555 02/21/12 0830  WBC 10.1 12.6* 21.7* 11.2*  NEUTROABS -- -- 18.4* --  HGB 9.5* 10.8* 11.7* 10.1*  HCT 28.0* 31.9* 34.2* 28.4*  MCV 88.3 87.4 86.4 85.3  PLT 328 348 346 280   Cardiac Enzymes: No results found for this basename: CKTOTAL:5,CKMB:5,CKMBINDEX:5,TROPONINI:5 in the last 168 hours BNP (last 3 results) No results found for this basename: PROBNP:3 in the last 8760 hours CBG:  Lab 02/25/12 0811 02/25/12 0406 02/25/12 0009 02/24/12 1953 02/24/12 1631  GLUCAP 125* 123* 133* 123* 149*    Recent Results (from the past 240 hour(s))  SURGICAL PCR SCREEN     Status: Normal   Collection Time   02/16/12  9:40 AM      Component Value Range Status Comment   MRSA, PCR  NEGATIVE  NEGATIVE Final    Staphylococcus aureus NEGATIVE  NEGATIVE Final   URINE CULTURE     Status: Normal (Preliminary result)   Collection Time   02/22/12  5:38 PM      Component Value Range Status Comment   Specimen Description URINE, CATHETERIZED   Final    Special Requests Normal   Final    Culture  Setup Time 02/23/2012 01:09   Final    Colony Count >=100,000 COLONIES/ML   Final    Culture GRAM NEGATIVE RODS   Final    Report Status PENDING   Incomplete      Studies: Ct Angio Chest Pe W/cm &/or Wo Cm  02/23/2012  *RADIOLOGY REPORT*  Clinical Data: Postop short of breath and wheezing .  Concern for pulmonary embolism.  CT ANGIOGRAPHY CHEST  Technique:  Multidetector CT imaging of the chest using the standard protocol during bolus administration of intravenous contrast. Multiplanar reconstructed images including MIPs were obtained and reviewed to evaluate the vascular anatomy.  Contrast: 27mL OMNIPAQUE IOHEXOL 350 MG/ML SOLN  Comparison: Chest radiograph 02/23/2012  Findings: Exam is degraded by respiratory motion which limits the evaluation of the lower lobe segmental pulmonary arteries.  There are no filling defects within the main pulmonary arteries or proximal interlobar pulmonary arteries.  No acute findings of the aorta or great vessels.  No pericardial fluid.  Review of the lung parenchyma demonstrates mild medial atelectasis at the left lung base.  Again there is significant respiratory motion at the lung bases.  No pneumothorax.  No pulmonary edema.  No axillary or supraclavicular lymphadenopathy.  A feeding tube extends into the stomach.  Limited view of the upper abdomen demonstrates a small amount of intraperitoneal free fluid along the right hepatic lobe and beneath the left hemidiaphragm.  Limited view of the skeleton demonstrates degenerative spurring.  IMPRESSION:  1.  No evidence of central pulmonary embolism.  Suboptimal evaluation of the lower lobe segmental pulmonary  arteries due to respiratory motion. If high clinical concern, consider VQ scan.  2.  No evidence of pneumothorax or pulmonary edema. 3.  Small amount of intraperitoneal free fluid.   Original Report Authenticated By: Suzy Bouchard, M.D.    Ct Abdomen Pelvis W Contrast  02/23/2012  *RADIOLOGY REPORT*  Clinical Data: Postop, abdominal distension, suspected ileus, evaluate for obstruction  CT ABDOMEN AND PELVIS WITH CONTRAST  Technique:  Multidetector CT imaging of the abdomen and pelvis was performed following the standard protocol during bolus administration of intravenous contrast.  Contrast: 139mL OMNIPAQUE IOHEXOL 300 MG/ML  SOLN  Comparison: Abdominal radiograph dated 02/22/2012.  CT abdomen pelvis dated 01/05/2012  Findings: Lung bases are essentially clear.  Liver, spleen, pancreas, and adrenal glands are  within normal limits.  Gallbladder is underdistended.  Suspected cholelithiasis, without associated inflammatory changes.  No intrahepatic or extrahepatic ductal dilatation.  Kidneys are within normal limits.  No hydronephrosis.  Enteric tube terminates in the region of the pylorus.  Dilated loops of small and large bowel.  A few loops of distal colon in the left mid abdomen and right pelvis are decompressed.  No definite focal transition is seen.  The overall appearance remains most suggestive of adynamic ileus in the recent postoperative setting.  Atherosclerotic calcifications of the abdominal aorta and branch vessels.  Small to moderate to abdominopelvic ascites with a small amount of layering hemorrhage (series 2/image 77), likely postoperative.  No suspicious abdominopelvic lymphadenopathy.  Status post hysterectomy and bilateral salpingo-oophorectomy.  Bladder is decompressed by an indwelling Foley catheter.  Postsurgical changes in the midline anterior abdominal wall with overlying skin staples.  Degenerative changes of the visualized thoracolumbar spine.  IMPRESSION: Multiple dilated loops of  small and large bowel, as described above.  The appearance remains most compatible with adynamic ileus in the recent postoperative setting. Follow-up radiographs are suggested.  Status post hysterectomy and bilateral salpingo-oophorectomy. Associated postsurgical changes with abdominopelvic ascites and a small amount of layering hemorrhage.   Original Report Authenticated By: Julian Hy, M.D.    Dg Chest Port 1 View  02/23/2012  *RADIOLOGY REPORT*  Clinical Data: Laparotomy 3 days ago, now with wheezing.  PORTABLE CHEST - 1 VIEW  Comparison: Two-view chest x-ray 02/16/2012.  Findings: Nasogastric tube courses below the diaphragm into the stomach.  Suboptimal inspiration with atelectasis at the left lung base.  Lungs remain clear otherwise.  Cardiac silhouette upper normal in size to slightly enlarged but stable.  Pulmonary vascularity normal.  IMPRESSION: Suboptimal inspiration with atelectasis at the left lung base.  No acute cardiopulmonary disease otherwise.   Original Report Authenticated By: Deniece Portela, M.D.     Scheduled Meds:   . albuterol  2.5 mg Nebulization Q6H  . ciprofloxacin  400 mg Intravenous Q12H  . enoxaparin (LOVENOX) injection  40 mg Subcutaneous Q24H  . furosemide  40 mg Intravenous Once  . HYDROmorphone PCA 0.3 mg/mL   Intravenous Q4H  . insulin aspart  0-15 Units Subcutaneous Q4H  . metoprolol  5 mg Intravenous Q6H  . pantoprazole (PROTONIX) IV  40 mg Intravenous Q24H  . risperiDONE  1 mg Oral QHS  . traZODone  50 mg Oral QHS  . DISCONTD: amLODipine  5 mg Oral Daily  . DISCONTD: benazepril  20 mg Oral Daily  . DISCONTD: carvedilol  12.5 mg Oral BID WC  . DISCONTD: metoprolol  5 mg Intravenous Q12H   Continuous Infusions:   . sodium chloride 50 mL/hr at 02/24/12 1643  . DISCONTD: dextrose 5 %-0.9% nacl with kcl 75 mL/hr at 02/25/12 0405      Time spent:     Louellen Molder  [facility] Pager . If 8PM-8AM, please contact night-coverage at  [email or web address], password [facility] 02/25/2012, 9:45 AM  LOS: 5 days

## 2012-02-25 NOTE — Progress Notes (Signed)
Dr. Delena Serve aware via phone output low today. MD aware attending physician ordered to dc foley. See new orders entered into EPIC by Dr. Delena Serve.

## 2012-02-25 NOTE — Progress Notes (Signed)
Patient ID: Natalie Rodgers, female   DOB: 1941-03-09, 71 y.o.   MRN: [id number] 5 Days Post-Op Procedure(s) (LRB): EXPLORATORY LAPAROTOMY (N/A) HYSTERECTOMY ABDOMINAL (N/A) SALPINGO OOPHERECTOMY (Bilateral)  Subjective: Patient reports difficulty breathing and nausea.    Objective: Vital signs in last 24 hours: Temp:  [98.2 F (36.8 C)-99.2 F (37.3 C)] 98.2 F (36.8 C) (09/29 1302) Pulse Rate:  [77-84] 80  (09/29 1302) Resp:  [17-24] 20  (09/29 1600) BP: (95-139)/(53-82) 139/71 mmHg (09/29 1302) SpO2:  [96 %-100 %] 100 % (09/29 1600) Last BM Date: 02/19/12  Intake/Output from previous day: 09/28 0701 - 09/29 0700 In: 5905.8 [P.O.:60; I.V.:5263.8; NG/GT:380; IV Piggyback:202] Out: 2025 [Urine:1325; Emesis/NG output:700]  Physical Examination: General: alert and no distress Resp: wheezes bilaterally Cardio: regular rate and rhythm, S1, S2 normal, no murmur, click, rub or gallop GI: abnormal findings:  absent bowel sounds Extremities: extremities normal, atraumatic, no cyanosis or edema Vaginal Bleeding: none  Labs: WBC/Hgb/Hct/Plts:  10.1/9.5/28.0/328 (09/29 [id number]) BUN/Cr/glu/ALT/AST/amyl/lip:  17/0.63/--/--/--/--/-- (09/29 [id number])   Assessment:  71 y.o. s/p Procedure(s): EXPLORATORY LAPAROTOMY HYSTERECTOMY ABDOMINAL SALPINGO OOPHERECTOMY: stable and post op ileus. Pain:  Pain is well-controlled on PCA or oral medications.  Heme:WNL's PLAN: Continue present care.  ID: none  CV: stable controlled. Current treatment:  metoprolol (Lopressor, Toprol).  GI:   Pt with ileus. The CT scan was negative: c/w ileus. The CT scan had the following: dilated loops of small and large bowel.  GI stress ulcer prophylaxis includes: proton pump inhibitor per orders. Treatment for N/V: zofran intravenous.   Endo: Diabetes mellitus Type II, under good control..  CBG: WNL's.  Prophylaxis: intermittent pneumatic compression boots and Lovenox.  Plan: Continue current  management.    LOS: 5 days    Anna-Marie Coller A 02/25/2012, 5:26 PM

## 2012-02-25 NOTE — Progress Notes (Signed)
General Surgery Note  LOS: 5 days  POD# 5 Room - 1410  Assessment/Plan: 1.   EXPLORATORY LAPAROTOMY, HYSTERECTOMY ABDOMINAL SALPINGO OOPHERECTOMY - [facility] - 02/20/2012  Final path benign.   2.  Adynamic ileus.  Continue NGT.  Unsure of whether she has passed flatus.  For KUB today  3.  UTI (lower urinary tract infection) - >100,000 gram neg rods  On Cipro 4.  Diabetes mellitus   5.  Hypertension  6.  Paroxysmal supraventricular tachycardia   PAC (premature atrial contraction)   Followed by Dr. Stanford Breed.  But he has signed off.  7.  Volume excess   UO about 500 cc last 12 hours. 8.  History of stroke with residual right sided weakness.  Unable to ambulate by herself.  Will ask PT to see. 9.  History of schizophrenia. 10.  DVT proph - Lovenox  Subjective:  Alert, but not a good historian.  No abdominal pain. Objective:   Filed Vitals:   02/25/12 0800  BP:   Pulse:   Temp:   Resp: 24     Intake/Output from previous day:  09/28 0701 - 09/29 0700 In: 5905.8 [P.O.:60; I.V.:5263.8; NG/GT:380; IV Piggyback:202] Out: 2025 [Urine:1325; Emesis/NG output:700]  Intake/Output this shift:      Physical Exam:   General: Older F who is alert.   HEENT: Normal. Pupils equal. .   Lungs: RR about 32.  Does not have good inspiratory effort.   Abdomen: Distended.  High pitched bowel sounds.  No localized tenderness.   Wound: Okay, though lower part has some drainage.  Nurse will discuss with GYN.   Neurologic:  Right arm and leg weakness.   Lab Results:    Basename 02/25/12 0527 02/24/12 0425  WBC 10.1 12.6*  HGB 9.5* 10.8*  HCT 28.0* 31.9*  PLT 328 348    BMET   Basename 02/25/12 0527 02/24/12 0425  NA 134* 135  K 4.4 4.9  CL 103 104  CO2 24 23  GLUCOSE 120* 162*  BUN 17 18  CREATININE 0.63 0.62  CALCIUM 8.0* 8.3*    PT/INR  No results found for this basename: LABPROT:2,INR:2 in the last 72 hours  ABG  No results found for this basename:  PHART:2,PCO2:2,PO2:2,HCO3:2 in the last 72 hours   Studies/Results:  Ct Angio Chest Pe W/cm &/or Wo Cm  02/23/2012  *RADIOLOGY REPORT*  Clinical Data: Postop short of breath and wheezing .  Concern for pulmonary embolism.  CT ANGIOGRAPHY CHEST  Technique:  Multidetector CT imaging of the chest using the standard protocol during bolus administration of intravenous contrast. Multiplanar reconstructed images including MIPs were obtained and reviewed to evaluate the vascular anatomy.  Contrast: 41mL OMNIPAQUE IOHEXOL 350 MG/ML SOLN  Comparison: Chest radiograph 02/23/2012  Findings: Exam is degraded by respiratory motion which limits the evaluation of the lower lobe segmental pulmonary arteries.  There are no filling defects within the main pulmonary arteries or proximal interlobar pulmonary arteries.  No acute findings of the aorta or great vessels.  No pericardial fluid.  Review of the lung parenchyma demonstrates mild medial atelectasis at the left lung base.  Again there is significant respiratory motion at the lung bases.  No pneumothorax.  No pulmonary edema.  No axillary or supraclavicular lymphadenopathy.  A feeding tube extends into the stomach.  Limited view of the upper abdomen demonstrates a small amount of intraperitoneal free fluid along the right hepatic lobe and beneath the left hemidiaphragm.  Limited view of the skeleton demonstrates  degenerative spurring.  IMPRESSION:  1.  No evidence of central pulmonary embolism.  Suboptimal evaluation of the lower lobe segmental pulmonary arteries due to respiratory motion. If high clinical concern, consider VQ scan.  2.  No evidence of pneumothorax or pulmonary edema. 3.  Small amount of intraperitoneal free fluid.   Original Report Authenticated By: Suzy Bouchard, M.D.    Ct Abdomen Pelvis W Contrast  02/23/2012  *RADIOLOGY REPORT*  Clinical Data: Postop, abdominal distension, suspected ileus, evaluate for obstruction  CT ABDOMEN AND PELVIS WITH CONTRAST   Technique:  Multidetector CT imaging of the abdomen and pelvis was performed following the standard protocol during bolus administration of intravenous contrast.  Contrast: 168mL OMNIPAQUE IOHEXOL 300 MG/ML  SOLN  Comparison: Abdominal radiograph dated 02/22/2012.  CT abdomen pelvis dated 01/05/2012  Findings: Lung bases are essentially clear.  Liver, spleen, pancreas, and adrenal glands are within normal limits.  Gallbladder is underdistended.  Suspected cholelithiasis, without associated inflammatory changes.  No intrahepatic or extrahepatic ductal dilatation.  Kidneys are within normal limits.  No hydronephrosis.  Enteric tube terminates in the region of the pylorus.  Dilated loops of small and large bowel.  A few loops of distal colon in the left mid abdomen and right pelvis are decompressed.  No definite focal transition is seen.  The overall appearance remains most suggestive of adynamic ileus in the recent postoperative setting.  Atherosclerotic calcifications of the abdominal aorta and branch vessels.  Small to moderate to abdominopelvic ascites with a small amount of layering hemorrhage (series 2/image 77), likely postoperative.  No suspicious abdominopelvic lymphadenopathy.  Status post hysterectomy and bilateral salpingo-oophorectomy.  Bladder is decompressed by an indwelling Foley catheter.  Postsurgical changes in the midline anterior abdominal wall with overlying skin staples.  Degenerative changes of the visualized thoracolumbar spine.  IMPRESSION: Multiple dilated loops of small and large bowel, as described above.  The appearance remains most compatible with adynamic ileus in the recent postoperative setting. Follow-up radiographs are suggested.  Status post hysterectomy and bilateral salpingo-oophorectomy. Associated postsurgical changes with abdominopelvic ascites and a small amount of layering hemorrhage.   Original Report Authenticated By: Julian Hy, M.D.    Dg Chest Port 1  View  02/23/2012  *RADIOLOGY REPORT*  Clinical Data: Laparotomy 3 days ago, now with wheezing.  PORTABLE CHEST - 1 VIEW  Comparison: Two-view chest x-ray 02/16/2012.  Findings: Nasogastric tube courses below the diaphragm into the stomach.  Suboptimal inspiration with atelectasis at the left lung base.  Lungs remain clear otherwise.  Cardiac silhouette upper normal in size to slightly enlarged but stable.  Pulmonary vascularity normal.  IMPRESSION: Suboptimal inspiration with atelectasis at the left lung base.  No acute cardiopulmonary disease otherwise.   Original Report Authenticated By: Deniece Portela, M.D.      Anti-infectives:   Anti-infectives     Start     Dose/Rate Route Frequency Ordered Stop   02/24/12 1000   ciprofloxacin (CIPRO) IVPB 400 mg        400 mg 200 mL/hr over 60 Minutes Intravenous Every 12 hours 02/24/12 0831     02/21/12 0600   ceFAZolin (ANCEF) IVPB 2 g/50 mL premix        2 g 100 mL/hr over 30 Minutes Intravenous On call to O.R. 02/20/12 1202 02/20/12 1418          Alphonsa Overall, MD, [place] Pager: [phone],   [place] [facility] Office: [phone] 02/25/2012

## 2012-02-26 ENCOUNTER — Inpatient Hospital Stay (HOSPITAL_COMMUNITY): Payer: Medicare Other

## 2012-02-26 LAB — GLUCOSE, CAPILLARY: Glucose-Capillary: 115 mg/dL — ABNORMAL HIGH (ref 70–99)

## 2012-02-26 LAB — CBC
HCT: 28 % — ABNORMAL LOW (ref 36.0–46.0)
MCH: 30.1 pg (ref 26.0–34.0)
MCHC: 34.3 g/dL (ref 30.0–36.0)
MCV: 87.8 fL (ref 78.0–100.0)
RDW: 13.3 % (ref 11.5–15.5)

## 2012-02-26 LAB — BASIC METABOLIC PANEL
BUN: 14 mg/dL (ref 6–23)
Creatinine, Ser: 0.68 mg/dL (ref 0.50–1.10)
GFR calc Af Amer: >90 mL/min (ref >90)
GFR calc non Af Amer: 85 mL/min — ABNORMAL LOW (ref >90)

## 2012-02-26 MED ORDER — BISACODYL 10 MG RE SUPP
10.0000 mg | Freq: Once | RECTAL | Status: AC
  Administered 2012-02-26: 10 mg via RECTAL

## 2012-02-26 MED ORDER — SODIUM CHLORIDE 0.45 % IV SOLN
INTRAVENOUS | Status: DC
  Administered 2012-02-26 – 2012-02-27 (×2): via INTRAVENOUS
  Filled 2012-02-26 (×8): qty 1000

## 2012-02-26 MED ORDER — HYDROMORPHONE HCL PF 1 MG/ML IJ SOLN
0.2000 mg | Freq: Four times a day (QID) | INTRAMUSCULAR | Status: DC | PRN
  Administered 2012-02-28: 0.2 mg via INTRAVENOUS
  Filled 2012-02-26: qty 1

## 2012-02-26 NOTE — Progress Notes (Signed)
[facility] PROGRESS NOTE  Natalie Rodgers [id number] DOB: [birth year] DOA: 02/20/2012 PCP: Reymundo Poll, MD    brief narrative: Medical consult  for 71 year old female with postop ileus, UTI and shortness of breath   Assessment/Plan:  Acute post op ileus  Possibly secondary secondary to poor mobilization  and narcotics.  Patient n.p.o.. still with bilious fluid drained via NG tube. -Monitor serial abdominal exam. Improving today with much less distention. Repeat abd x ray today shows further improvement -Recommend to titrate pain medication as tolerated and reduce dose -PT eval   Shortness of breath  Secondary to atelectasis with wheezing. Improved with nebs  o2 via [place]   PACs  Improved. Stable on tle. Cont prn lopressor   UTI  Urine culture growing Morganella which is sensitive to quinolones. Patient has been started on IV ciprofloxacin (date 3). Recommend treating for 7 days   Diabetes mellitus  Patient on Glucophage and Amaryl at home which has been held. Continue sliding scale insulin.   Hypertension  She is on amlodipine, carvedilol and ARB with well controlled blood pressure. Currently n.p.o. and on  as needed IV hydralazine and Lopressor   GERD  Continue IV Protonix   Hx of CVA with rt sided weakness  Continue PT  Code Status: full   Medical consult will sign off. Please call for any questions. Thank you for allowing [place] to participate in patient's care.  Antibiotics:  IV cipro ( day 3/7) HPI/Subjective:  Patient excited that it's her birthday today. Feels better today, no SOB.     Objective: Filed Vitals:   02/26/12 0813 02/26/12 0840 02/26/12 1232 02/26/12 1355  BP:   142/80 152/61  Pulse:   83 84  Temp:    97.4 F (36.3 C)  TempSrc:    Oral  Resp: 13   18  Height:      Weight:      SpO2: 100% 99%  96%    Intake/Output Summary (Last 24 hours) at 02/26/12 1434 Last data filed at 02/26/12 1401  Gross per 24 hour  Intake   1253 ml    Output   2725 ml  Net  -1472 ml   Filed Weights   02/20/12 1742  Weight: 66.8 kg (147 lb 4.3 oz)    Exam:  General: Elderly female lying in bed in no acute distress  HEENT: No pallor, no icterus, moist oral mucosa, NG in place draining bilious fluid. ( 1350 cc past 24 hrs) Heart: Regular rate and rhythm, without murmurs, rubs, gallops.  Lungs: clear b/l  Abdomen: abdominal distention reduced, soft on exam, BS+., staples intact  Extremities: No clubbing cyanosis or edema with positive pedal pulses.  Neuro: Grossly intact, nonfocal.   Data Reviewed: Basic Metabolic Panel:  Lab [id number] 02/25/12 0527 02/24/12 0425 02/23/12 1744 02/23/12 0426  NA 135 134* 135 133* 135  K 3.1* 4.4 4.9 4.3 4.0  CL 97 103 104 100 97  CO2 26 24 23 23 26   GLUCOSE 107* 120* 162* 160* 195*  BUN 14 17 18 17 17   CREATININE 0.68 0.63 0.62 0.60 0.64  CALCIUM 8.2* 8.0* 8.3* 8.8 9.3  MG -- -- -- -- --  PHOS -- -- -- -- --   Liver Function Tests: No results found for this basename: AST:5,ALT:5,ALKPHOS:5,BILITOT:5,PROT:5,ALBUMIN:5 in the last 168 hours No results found for this basename: LIPASE:5,AMYLASE:5 in the last 168 hours No results found for this basename: AMMONIA:5 in the last 168 hours CBC:  Lab 02/26/12  [id number] 02/25/12 0527 02/24/12 0425 02/23/12 1555 02/21/12 0830  WBC 10.1 10.1 12.6* 21.7* 11.2*  NEUTROABS -- -- -- 18.4* --  HGB 9.6* 9.5* 10.8* 11.7* 10.1*  HCT 28.0* 28.0* 31.9* 34.2* 28.4*  MCV 87.8 88.3 87.4 86.4 85.3  PLT 329 328 348 346 280   Cardiac Enzymes: No results found for this basename: CKTOTAL:5,CKMB:5,CKMBINDEX:5,TROPONINI:5 in the last 168 hours BNP (last 3 results) No results found for this basename: PROBNP:3 in the last 8760 hours CBG:  Lab 02/26/12 1215 02/26/12 0752 02/26/12 0437 02/25/12 2355 02/25/12 2030  GLUCAP 125* 124* 115* 145* 102*    Recent Results (from the past 240 hour(s))  URINE CULTURE     Status: Normal   Collection Time   02/22/12  5:38 PM       Component Value Range Status Comment   Specimen Description URINE, CATHETERIZED   Final    Special Requests Normal   Final    Culture  Setup Time 02/23/2012 01:09   Final    Colony Count >=100,000 COLONIES/ML   Final    Culture [facility] MORGANII   Final    Report Status 02/25/2012 FINAL   Final    Organism ID, Bacteria MORGANELLA MORGANII   Final      Studies: Dg Abd 1 View  02/26/2012  *RADIOLOGY REPORT*  Clinical Data: Ileus and abdominal distention  ABDOMEN - 1 VIEW  Comparison: Abdominal radiographs 02/25/2012 CT abdomen pelvis 02/23/2012  Findings: Two supine views of the abdomen are obtained. Nasogastric tube terminates in the distal stomach. The stomach is nondistended.  Vertical line of skin staples projects over the lower abdomen.  There are dilated loops of small bowel, measuring up to approximately 4.8 cm caliber and the left abdomen.  The number of dilated small bowel loops appears slightly decreased compared to the radiographs of 02/25/2012.  Gas is seen throughout the colon and in the rectum, which is normal limits for caliber.  No gross evidence of free intraperitoneal air or pneumatosis.  Slight linear atelectasis at the left lung base.  IMPRESSION: Slight improvement in probable postoperative adynamic ileus compared to 02/25/2012. Consider continued radiographic follow-up, given the small bowel dilatation up to 4.8 cm.   Original Report Authenticated By: Curlene Dolphin, M.D.    Dg Abd 2 Views  02/25/2012  *RADIOLOGY REPORT*  Clinical Data: Ileus versus small bowel obstruction  ABDOMEN - 2 VIEW  Comparison: CT of 02/23/2012 plain film of 02/22/2012.  Findings: 2 right-sided up decubitus views and a supine view. Right side up decubitus view demonstrates no free intraperitoneal air.  No definite air-fluid levels.  The pelvis is excluded from the decubitus views.  A supine view demonstrates persistent small bowel dilatation at up to 5.0 cm.  There is normal caliber gas filled colon.   No pneumatosis.  A nasogastric tube terminates at the body of the stomach.  Midline laparotomy staples.  IMPRESSION: Improved appearance of the abdomen/pelvis.  Persistent small bowel dilatation, with normal gas filled colon (new finding).  This suggests evolving postoperative adynamic ileus.  Partial small bowel obstruction cannot be entirely excluded.   Original Report Authenticated By: Areta Haber, M.D.     Scheduled Meds:   . albuterol  2.5 mg Nebulization Q6H  . antiseptic oral rinse  15 mL Mouth Rinse q12n4p  . bisacodyl  10 mg Rectal Once  . chlorhexidine  15 mL Mouth Rinse BID  . ciprofloxacin  400 mg Intravenous Q12H  . enoxaparin (LOVENOX) injection  40  mg Subcutaneous Q24H  . furosemide  40 mg Intravenous Once  . insulin aspart  0-15 Units Subcutaneous Q4H  . metoprolol  5 mg Intravenous Q6H  . pantoprazole (PROTONIX) IV  40 mg Intravenous Q24H  . risperiDONE  1 mg Oral QHS  . traZODone  50 mg Oral QHS  . DISCONTD: HYDROmorphone PCA 0.3 mg/mL   Intravenous Q4H   Continuous Infusions:   . sodium chloride 50 mL/hr at 02/25/12 1736      Time spent: 25 minutes    Abdulrahim Siddiqi  [facility] Pager [phone]. If 8PM-8AM, please contact night-coverage at [email or web address], password [facility] 02/26/2012, 2:34 PM  LOS: 6 days

## 2012-02-26 NOTE — Progress Notes (Signed)
Physical Therapy Treatment Patient Details Name: CAMRIE ROTTMANN MRN: [id number] DOB: 20-Jul-1940 Today's Date: 02/26/2012 Time: 1002-1029 PT Time Calculation (min): 27 min  Happy Birthday !!!!!  PT Assessment / Plan / Recommendation Comments on Treatment Session  Pt states she feels better.  Would love to have something to drink but has an NG tube. No c/o pain.  Assisted OOB and amb in hallway using EVA walker for increased support.    Follow Up Recommendations  Post acute inpatient rehab;Supervision/Assistance - 24 hour (Skilled Nursing)    Barriers to Discharge        Equipment Recommendations  Rolling walker with 5" wheels    Recommendations for Other Services    Frequency Min 3X/week   Plan Discharge plan remains appropriate    Precautions / Restrictions Precautions Precautions: Fall Precaution Comments: ABD incision Restrictions Weight Bearing Restrictions: No    Pertinent Vitals/Pain No c/o pain    Mobility  Bed Mobility Bed Mobility: Supine to Sit Supine to Sit: 2: Max assist Details for Bed Mobility Assistance: HOB elevated 45 and max assist 2nd ABD incision  Transfers Transfers: Sit to Stand;Stand to Sit Sit to Stand: 3: Mod assist;2: Max assist;From bed;From elevated surface Stand to Sit: 3: Mod assist;2: Max assist;To chair/3-in-1 Details for Transfer Assistance: 75% VC's on proper tech and hand placement.  Decreased hand grip R.  Ambulation/Gait Ambulation/Gait Assistance: 1: +2 Total assist Ambulation/Gait: Patient Percentage: 60% Ambulation Distance (Feet): 85 Feet Assistive device: Eva walker Ambulation/Gait Assistance Details: Used EVA walker for increased support and to increase amb distance.  B LE weakness.  Tolerated amb well.  Chair following behind for safety. Gait Pattern: Step-through pattern;Decreased stride length;Trunk flexed;Shuffle Gait velocity: decreased     PT Goals progressing    Visit Information  Last PT Received On:  02/26/12 Assistance Needed: +2    Subjective Data   "Today's my Birthday"   Cognition    AxOx4   Balance   poor  End of Session PT - End of Session Equipment Utilized During Treatment: Gait belt Activity Tolerance: Patient tolerated treatment well Patient left: in chair;with call bell/phone within reach Nurse Communication: Mobility status   Rica Koyanagi  PTA [facility]  Acute  Rehab Pager     [phone]

## 2012-02-26 NOTE — Progress Notes (Signed)
6 Days Post-Op  Subjective: In good spirits.  Says its her birthday. No flatus or BM.  Objective: Vital signs in last 24 hours: Temp:  [98.2 F (36.8 C)-99.4 F (37.4 C)] 98.9 F (37.2 C) (09/30 0600) Pulse Rate:  [77-98] 77  (09/30 0600) Resp:  [18-24] 19  (09/30 0600) BP: (116-145)/(54-71) 123/58 mmHg (09/30 0600) SpO2:  [98 %-100 %] 100 % (09/30 0600) Last BM Date: 02/19/12  Intake/Output from previous day: 08-Mar-2023 0701 - 09/30 0700 In: 1633 [P.O.:60; I.V.:1135; NG/GT:30; IV Piggyback:408] Out: 3125 [Urine:1775; Emesis/NG output:1350] Intake/Output this shift:    GI: SOFT WITH SOME DISTENSION.  NO BS.  No tenderness.  staples in place.  Lab Results:   Basename 02/26/12 0420 2012-03-07 0527  WBC 10.1 10.1  HGB 9.6* 9.5*  HCT 28.0* 28.0*  PLT 329 328   BMET  Basename 02/26/12 0420 2012-03-07 0527  NA 135 134*  K 3.1* 4.4  CL 97 103  CO2 26 24  GLUCOSE 107* 120*  BUN 14 17  CREATININE 0.68 0.63  CALCIUM 8.2* 8.0*   PT/INR No results found for this basename: LABPROT:2,INR:2 in the last 72 hours ABG No results found for this basename: PHART:2,PCO2:2,PO2:2,HCO3:2 in the last 72 hours  Studies/Results: Dg Abd 2 Views  03/07/2012  *RADIOLOGY REPORT*  Clinical Data: Ileus versus small bowel obstruction  ABDOMEN - 2 VIEW  Comparison: CT of 02/23/2012 plain film of 02/22/2012.  Findings: 2 right-sided up decubitus views and a supine view. Right side up decubitus view demonstrates no free intraperitoneal air.  No definite air-fluid levels.  The pelvis is excluded from the decubitus views.  A supine view demonstrates persistent small bowel dilatation at up to 5.0 cm.  There is normal caliber gas filled colon.  No pneumatosis.  A nasogastric tube terminates at the body of the stomach.  Midline laparotomy staples.  IMPRESSION: Improved appearance of the abdomen/pelvis.  Persistent small bowel dilatation, with normal gas filled colon (new finding).  This suggests evolving  postoperative adynamic ileus.  Partial small bowel obstruction cannot be entirely excluded.   Original Report Authenticated By: Areta Haber, M.D.     Anti-infectives: Anti-infectives     Start     Dose/Rate Route Frequency Ordered Stop   02/24/12 1000   ciprofloxacin (CIPRO) IVPB 400 mg        400 mg 200 mL/hr over 60 Minutes Intravenous Every 12 hours 02/24/12 0831     02/21/12 0600   ceFAZolin (ANCEF) IVPB 2 g/50 mL premix        2 g 100 mL/hr over 30 Minutes Intravenous On call to O.R. 02/20/12 1202 02/20/12 1418          Assessment/Plan: s/p Procedure(s) (LRB) with comments: EXPLORATORY LAPAROTOMY (N/A) HYSTERECTOMY ABDOMINAL (N/A) SALPINGO OOPHERECTOMY (Bilateral) Adynamic ileus.  NGT output is less but she denies flatus.  Check films today and follow for now.  Try dulcolax suppository for rectal stimulation. Continue SUPPORT.  OOB needs to ambulate and PT.  LOS: 6 days    Maalik Pinn A. 02/26/2012

## 2012-02-26 NOTE — Progress Notes (Signed)
6 Days Post-Op Procedure(s) (LRB): EXPLORATORY LAPAROTOMY (N/A) HYSTERECTOMY ABDOMINAL (N/A) SALPINGO OOPHERECTOMY (Bilateral)  Subjective: Patient reports no complaints.  Smiling this am.  "Today is my birthday!"  Denies nausea, vomiting, chest pain, dyspnea, passing flatus, or having a bowel movement.  Asking if she can drink coffee.  Objective: Vital signs in last 24 hours: Temp:  [98.2 F (36.8 C)-99.4 F (37.4 C)] 98.9 F (37.2 C) (09/30 0600) Pulse Rate:  [77-98] 77  (09/30 0600) Resp:  [13-24] 13  (09/30 0813) BP: (116-145)/(54-71) 123/58 mmHg (09/30 0600) SpO2:  [98 %-100 %] 99 % (09/30 0840) Last BM Date: 02/19/12  Intake/Output from previous day: 09/29 0701 - 09/30 0700 In: 1633 [P.O.:60; I.V.:1135; NG/GT:30; IV Piggyback:408] Out: 3125 [Urine:1775; Emesis/NG output:1350]  Physical Examination: General: alert, cooperative and no distress Resp: Diminished bilaterally in lower bases Cardio: irregularly irregular rhythm GI: abnormal findings:  faint bowel sounds, abd soft, mildly distended and incision: midline with staples clean, dry, and intact.  Ecchymosis noted. Extremities: extremities normal, atraumatic, no cyanosis or edema  Labs: WBC/Hgb/Hct/Plts:  10.1/9.6/28.0/329 (09/30 0420) BUN/Cr/glu/ALT/AST/amyl/lip:  14/0.68/--/--/--/--/-- (09/30 0420)  Radiology: Ct Angio Chest Pe W/cm &/or Wo Cm 02/23/2012  IMPRESSION: 1. No evidence of central pulmonary embolism. Suboptimal evaluation of the lower lobe segmental pulmonary arteries due to respiratory motion. If high clinical concern, consider VQ scan. 2. No evidence of pneumothorax or pulmonary edema. 3. Small amount of intraperitoneal free fluid. Original Report Authenticated By: Suzy Bouchard, M.D.  Ct Abdomen Pelvis W Contrast 02/23/2012:  IMPRESSION: Multiple dilated loops of small and large bowel, as described above. The appearance remains most compatible with adynamic ileus in the recent postoperative setting.  Follow-up radiographs are suggested. Status post hysterectomy and bilateral salpingo-oophorectomy. Associated postsurgical changes with abdominopelvic ascites and a small amount of layering hemorrhage. Original Report Authenticated By: Julian Hy, M.D.  [facility] Chest Port 1 View 02/23/2012:  IMPRESSION: Suboptimal inspiration with atelectasis at the left lung base. No acute cardiopulmonary disease otherwise. Original Report Authenticated By: Deniece Portela, M.D.  DG Abd 2 View 02/25/12: IMPRESSION:  Improved appearance of the abdomen/pelvis. Persistent small bowel dilatation, with normal gas filled colon (new finding). This suggests evolving postoperative adynamic ileus. Partial small bowel obstruction cannot be entirely excluded. DG Abd 1 View 02/26/12: IMPRESSION:  Slight improvement in probable postoperative adynamic ileus compared to 02/25/2012. Consider continued radiographic follow-up, given the small bowel dilatation up to 4.8 cm.   Assessment: 71 y.o. s/p Procedure(s): EXPLORATORY LAPAROTOMY HYSTERECTOMY ABDOMINAL SALPINGO OOPHERECTOMY: stable Pain:  Pain is well-controlled.  Staff reporting that patient is not using PCA.  ID:  Urine culture from 02/22/12:  >=100,000 COLONIES/ML Culture MORGANELLA MORGANII.  Currently on Cipro IV.    CV: Hypertension: Stable.  Current treatment:  Metoprolol 5 mg IV Q6H, Hydralazine 10 mg IV Q6H PRN.  On Telemetry.  Heart rate and rhythm remains irregular.    Resp:  No wheezing this am.  Improvement with albuterol neb treatments.  GI:  Tolerating po: No: NPO.  NG in place.  1500 cc NG output from 02/25/12 to 02/26/12.  DG Abd 1 View 02/26/12: IMPRESSION:  Slight improvement in probable postoperative adynamic ileus compared to 02/25/2012. Consider continued radiographic follow-up, given the small bowel dilatation up to 4.8 cm.  GI stress ulcer prophylaxis includes: proton pump inhibitor per orders. Treatment for N/V: Zofran intravenous.  GU:  2025 cc urine  output from 02/25/12 to 02/26/12.  Foley in place.  On Cipro IV for urinary tract infection.  FEN:  Hypokalemia, K+ 3.1 this am.  On 02/25/12, K+ 4.4.  Endo: Diabetes mellitus Type II, under good control..  CBG:  Sliding scale in place.  CBG (last 3)   Basename 02/26/12 0752 02/26/12 0437 02/25/12 2355  GLUCAP 124* 115* 145*   Prophylaxis: pharmacologic prophylaxis (with any of the following: enoxaparin (Lovenox) 40mg  SQ 2 hours prior to surgery then every day) and intermittent pneumatic compression boots.  Plan: Appreciate General Surgery, Cardiology, and Hospitalist consults. Continue IV Cipro for urinary tract infection. Questioning need for additional maintenance IVF per Dr. Delsa Sale due to hypotensive episodes. Replace K+ per Hospitalist's recommendations.    Continue NG tube to suction and dulcolax suppository this am per General Surgery. Recommend removal of foley catheter and monitor output. Continue Lovenox for DVT prophylaxis. OOB with assist from the nursing staff and during PT visits. Continue daily weights. Encourage ambulation Social Work following for discharge placement  LOS: 6 days    Kollin Udell DEAL 02/26/2012, 9:39 AM

## 2012-02-27 LAB — GLUCOSE, CAPILLARY
Glucose-Capillary: 102 mg/dL — ABNORMAL HIGH (ref 70–99)
Glucose-Capillary: 113 mg/dL — ABNORMAL HIGH (ref 70–99)
Glucose-Capillary: 118 mg/dL — ABNORMAL HIGH (ref 70–99)
Glucose-Capillary: 102 mg/dL — ABNORMAL HIGH (ref 70–99)
Glucose-Capillary: 139 mg/dL — ABNORMAL HIGH (ref 70–99)
Glucose-Capillary: 141 mg/dL — ABNORMAL HIGH (ref 70–99)

## 2012-02-27 LAB — CBC
Hemoglobin: 9.7 g/dL — ABNORMAL LOW (ref 12.0–15.0)
MCV: 86.8 fL (ref 78.0–100.0)
Platelets: 373 10*3/uL (ref 150–400)
RBC: 3.26 MIL/uL — ABNORMAL LOW (ref 3.87–5.11)
WBC: 10.1 10*3/uL (ref 4.0–10.5)

## 2012-02-27 LAB — BASIC METABOLIC PANEL
CO2: 25 mEq/L (ref 19–32)
Chloride: 95 mEq/L — ABNORMAL LOW (ref 96–112)
Creatinine, Ser: 0.55 mg/dL (ref 0.50–1.10)
Glucose, Bld: 98 mg/dL (ref 70–99)

## 2012-02-27 SURGERY — LAPAROTOMY, EXPLORATORY
Anesthesia: General

## 2012-02-27 MED ORDER — MAGNESIUM CHLORIDE 64 MG PO TBEC
2.0000 | DELAYED_RELEASE_TABLET | Freq: Every day | ORAL | Status: DC
  Administered 2012-02-27 – 2012-03-01 (×4): 128 mg via ORAL
  Filled 2012-02-27 (×4): qty 2

## 2012-02-27 MED ORDER — ALBUTEROL SULFATE HFA 108 (90 BASE) MCG/ACT IN AERS
2.0000 | INHALATION_SPRAY | Freq: Three times a day (TID) | RESPIRATORY_TRACT | Status: DC
  Administered 2012-02-27 – 2012-03-01 (×10): 2 via RESPIRATORY_TRACT
  Filled 2012-02-27: qty 6.7

## 2012-02-27 MED ORDER — ALBUTEROL SULFATE (5 MG/ML) 0.5% IN NEBU: 2.5000 mg | INHALATION_SOLUTION | Freq: Four times a day (QID) | RESPIRATORY_TRACT | Status: DC | PRN

## 2012-02-27 MED ORDER — BOOST / RESOURCE BREEZE PO LIQD
1.0000 | Freq: Two times a day (BID) | ORAL | Status: DC
  Administered 2012-02-27 – 2012-03-01 (×6): 1 via ORAL

## 2012-02-27 MED ORDER — POTASSIUM CHLORIDE 10 MEQ/100ML IV SOLN
10.0000 meq | INTRAVENOUS | Status: AC
  Administered 2012-02-27 (×3): 10 meq via INTRAVENOUS
  Filled 2012-02-27 (×3): qty 100

## 2012-02-27 NOTE — Progress Notes (Signed)
Has tolerated clear liquids w/no complaints, 4 loose BMs today, up to chair and BSC mostly, very weak w/ amb

## 2012-02-27 NOTE — Progress Notes (Signed)
7 Days Post-Op  Subjective: PT HAD 2 BM.  NGT output down.    Objective: Vital signs in last 24 hours: Temp:  [97.4 F (36.3 C)-99.2 F (37.3 C)] 98.5 F (36.9 C) (10/01 0556) Pulse Rate:  [83-102] 102  (10/01 0556) Resp:  [18-20] 20  (10/01 0556) BP: (138-157)/(61-82) 156/82 mmHg (10/01 0556) SpO2:  [93 %-96 %] 93 % (10/01 0838) Weight:  [149 lb 9.6 oz (67.858 kg)] 149 lb 9.6 oz (67.858 kg) (10/01 0556) Last BM Date: 02/27/12  Intake/Output from previous day: 09/30 0701 - 10/01 0700 In: 2245.8 [I.V.:1845.8; IV Piggyback:400] Out: 1700 [Urine:650; Emesis/NG output:1050] Intake/Output this shift:    Incision/Wound:soft less distended non tender wound intact  Lab Results:   Basename 02/27/12 0415 02/26/12 0420  WBC 10.1 10.1  HGB 9.7* 9.6*  HCT 28.3* 28.0*  PLT 373 329   BMET  Basename 02/27/12 0415 02/26/12 0420  NA 134* 135  K 3.2* 3.1*  CL 95* 97  CO2 25 26  GLUCOSE 98 107*  BUN 9 14  CREATININE 0.55 0.68  CALCIUM 8.1* 8.2*   PT/INR No results found for this basename: LABPROT:2,INR:2 in the last 72 hours ABG No results found for this basename: PHART:2,PCO2:2,PO2:2,HCO3:2 in the last 72 hours  Studies/Results: Dg Abd 1 View  02/26/2012  *RADIOLOGY REPORT*  Clinical Data: Ileus and abdominal distention  ABDOMEN - 1 VIEW  Comparison: Abdominal radiographs 02/25/2012 CT abdomen pelvis 02/23/2012  Findings: Two supine views of the abdomen are obtained. Nasogastric tube terminates in the distal stomach. The stomach is nondistended.  Vertical line of skin staples projects over the lower abdomen.  There are dilated loops of small bowel, measuring up to approximately 4.8 cm caliber and the left abdomen.  The number of dilated small bowel loops appears slightly decreased compared to the radiographs of 02/25/2012.  Gas is seen throughout the colon and in the rectum, which is normal limits for caliber.  No gross evidence of free intraperitoneal air or pneumatosis.  Slight  linear atelectasis at the left lung base.  IMPRESSION: Slight improvement in probable postoperative adynamic ileus compared to 02/25/2012. Consider continued radiographic follow-up, given the small bowel dilatation up to 4.8 cm.   Original Report Authenticated By: Curlene Dolphin, M.D.    Dg Abd 2 Views  02/25/2012  *RADIOLOGY REPORT*  Clinical Data: Ileus versus small bowel obstruction  ABDOMEN - 2 VIEW  Comparison: CT of 02/23/2012 plain film of 02/22/2012.  Findings: 2 right-sided up decubitus views and a supine view. Right side up decubitus view demonstrates no free intraperitoneal air.  No definite air-fluid levels.  The pelvis is excluded from the decubitus views.  A supine view demonstrates persistent small bowel dilatation at up to 5.0 cm.  There is normal caliber gas filled colon.  No pneumatosis.  A nasogastric tube terminates at the body of the stomach.  Midline laparotomy staples.  IMPRESSION: Improved appearance of the abdomen/pelvis.  Persistent small bowel dilatation, with normal gas filled colon (new finding).  This suggests evolving postoperative adynamic ileus.  Partial small bowel obstruction cannot be entirely excluded.   Original Report Authenticated By: Areta Haber, M.D.     Anti-infectives: Anti-infectives     Start     Dose/Rate Route Frequency Ordered Stop   02/24/12 1000   ciprofloxacin (CIPRO) IVPB 400 mg        400 mg 200 mL/hr over 60 Minutes Intravenous Every 12 hours 02/24/12 0831     02/21/12 0600  ceFAZolin (ANCEF) IVPB 2 g/50 mL premix        2 g 100 mL/hr over 30 Minutes Intravenous On call to O.R. 02/20/12 1202 02/20/12 1418          Assessment/Plan: s/p Procedure(s) (LRB) with comments: EXPLORATORY LAPAROTOMY (N/A) HYSTERECTOMY ABDOMINAL (N/A) SALPINGO OOPHERECTOMY (Bilateral) Ileus resolving  Replace K D/G NGT  CLEARS TONIGHT OOB AMBULATE  LOS: 7 days    Moon Budde A. 02/27/2012

## 2012-02-27 NOTE — Care Management Note (Signed)
    Page 1 of 1   03/01/2012     11:45:29 AM   CARE MANAGEMENT NOTE 03/01/2012  Patient:  Natalie Rodgers, Natalie Rodgers   Account Number:  [id number]  Date Initiated:  02/21/2012  Documentation initiated by:  Sunday Spillers  Subjective/Objective Assessment:   71 yo female admitted s/p TAH for pelvic mass. PTA lived at [place].     Action/Plan:   Anticipated DC Date:  03/01/2012   Anticipated DC Plan:  SKILLED NURSING FACILITY  In-house referral  Clinical Social Worker      DC Planning Services  CM consult      Choice offered to / List presented to:             Status of service:  Completed, signed off Medicare Important Message given?   (If response is "NO", the following Medicare IM given date fields will be blank) Date Medicare IM given:   Date Additional Medicare IM given:    Discharge Disposition:  [place]  Per UR Regulation:  Reviewed for med. necessity/level of care/duration of stay  If discussed at [place] of Stay Meetings, dates discussed:   02/27/2012  02/29/2012    Comments:  02/29/12 Doyal Saric RN,BSN NCM [phone] POD#9,ADVANCED DIET-CHO MOD,IV ABX CHANGED TO PO.PER MD D/C SNF IN AM IF MED STABLE. 02/27/12 Keyani Rigdon RN,BSN NCM [id number] POD#7 EXP LAP,HYSTERECTOMY,BSO,P/O ILEUS,D/C NG,NPO,2 BM,UTI-IV ABX.D/C SNF WHEN MED STABLE.

## 2012-02-27 NOTE — Progress Notes (Signed)
INITIAL ADULT NUTRITION ASSESSMENT Date: 02/27/2012   Time: 1:48 PM Reason for Assessment: Consult for poor PO  ASSESSMENT: Female 71 y.o.  Dx: Abdominal mass, RLQ (right lower quadrant)  INTERVENTION: 1. Will order patient resource breeze nutrition supplement BID, Provides 500 kcal and 18 grams of protein daily.  2. RD to follow for nutrition plan of care.   Hx:  Past Medical History  Diagnosis Date  . Abdominal mass 02-16-12    02-20-12 surgery planned -Dr. Alycia Rossetti  . Hypertension   . Dementia since 2010  . Stroke 02-16-12    remains with rt. side weakness-uses right hand  . Diabetes mellitus 02-16-12    oral meds only  . GERD (gastroesophageal reflux disease) 02-16-12    tx. omeprazole    Related Meds:  Scheduled Meds:   . albuterol  2 puff Inhalation TID  . antiseptic oral rinse  15 mL Mouth Rinse q12n4p  . bisacodyl  10 mg Rectal Once  . chlorhexidine  15 mL Mouth Rinse BID  . ciprofloxacin  400 mg Intravenous Q12H  . enoxaparin (LOVENOX) injection  40 mg Subcutaneous Q24H  . insulin aspart  0-15 Units Subcutaneous Q4H  . metoprolol  5 mg Intravenous Q6H  . pantoprazole (PROTONIX) IV  40 mg Intravenous Q24H  . potassium chloride  10 mEq Intravenous Q1 Hr x 3  . risperiDONE  1 mg Oral QHS  . traZODone  50 mg Oral QHS  . DISCONTD: albuterol  2.5 mg Nebulization Q6H   Continuous Infusions:   . sodium chloride 0.45 % with kcl 100 mL/hr at 02/27/12 0602  . DISCONTD: sodium chloride 50 mL/hr at 02/25/12 1736   PRN Meds:.albuterol, bisacodyl, diphenhydrAMINE, hydrALAZINE, HYDROmorphone (DILAUDID) injection, naloxone, ondansetron (ZOFRAN) IV, ondansetron, sodium chloride   Ht: 5\' 3"  (160 cm)  Wt: 149 lb 9.6 oz (67.858 kg)  Ideal Wt: 52.27 kg % Ideal Wt: 129.5% Wt Readings from Last 10 Encounters:  02/27/12 149 lb 9.6 oz (67.858 kg)  02/27/12 149 lb 9.6 oz (67.858 kg)  02/16/12 161 lb 5 oz (73.171 kg)  02/01/12 152 lb 1.6 oz (68.992 kg)  01/23/12 152 lb 6.4 oz  (69.128 kg)  01/02/12 153 lb 4 oz (69.514 kg)  *Weight down 4 lb over 2 months, 2.6% from baseline.   Body mass index is 26.50 kg/(m^2). (Overweight)  Food/Nutrition Related Hx: Patient with good appetite this afternoon. Patient reported she was hungry. Noted NG tube placed Friday for suction, removed today. Patient diet has been advanced from NPO to clear liquids. Per NP note, patient tolerating clears well now that NG tube has been removed. Per RN, patient with 2 BM in th last 24 hours.  Labs:  CMP     Component Value Date/Time   NA 134* 02/27/2012 0415   K 3.2* 02/27/2012 0415   CL 95* 02/27/2012 0415   CO2 25 02/27/2012 0415   GLUCOSE 98 02/27/2012 0415   BUN 9 02/27/2012 0415   CREATININE 0.55 02/27/2012 0415   CREATININE 0.62 01/02/2012 1258   CALCIUM 8.1* 02/27/2012 0415   PROT 7.8 02/16/2012 1000   ALBUMIN 4.2 02/16/2012 1000   AST 28 02/16/2012 1000   ALT 43* 02/16/2012 1000   ALKPHOS 74 02/16/2012 1000   BILITOT 0.2* 02/16/2012 1000   GFRNONAA >90 02/27/2012 0415   GFRAA >90 02/27/2012 0415    Intake/Output Summary (Last 24 hours) at 02/27/12 1349 Last data filed at 02/27/12 1340  Gross per 24 hour  Intake 2245.83 ml  Output   2375 ml  Net -129.17 ml     Diet Order: Clear Liquid  Supplements/Tube Feeding: none at this time   IVF:    sodium chloride 0.45 % with kcl Last Rate: 100 mL/hr at 02/27/12 0602  DISCONTD: sodium chloride Last Rate: 50 mL/hr at 02/25/12 1736    Estimated Nutritional Needs:   Kcal: [id number] Protein: 81-101 grams  Fluid: 1 ml per kcal intake   NUTRITION DIAGNOSIS: -Inadequate oral intake (NI-2.1).  Status: Ongoing  RELATED TO: limited ability to eat   AS EVIDENCE BY: pt NPO x5 days and patient now on clear liquid diet restriction.   MONITORING/EVALUATION(Goals): PO intake, diet advancements/ tolerance, weights, labs 1. PO intake > 75% at meals and supplements.  2. Diet advancements as medically able with positive tolerance.   EDUCATION  NEEDS: -No education needs identified at this time  INTERVENTION: 1. Will order patient resource breeze nutrition supplement BID, Provides 500 kcal and 18 grams of protein daily.  2. RD to follow for nutrition plan of care.   Dietitian [phone]  DOCUMENTATION CODES Per approved criteria  -Not Applicable    Loyce Dys [facility] 02/27/2012, 1:48 PM

## 2012-02-27 NOTE — Progress Notes (Signed)
7 Days Post-Op Procedure(s) (LRB): EXPLORATORY LAPAROTOMY (N/A) HYSTERECTOMY ABDOMINAL (N/A) SALPINGO OOPHERECTOMY (Bilateral)  Subjective: Patient reports no complaints.  Denies nausea, vomiting, abdominal pain, chest pain, dyspnea, or passing flatus.  Reporting having one bowel movement last pm and this am.  "Can I drink milk now?"   Objective: Vital signs in last 24 hours: Temp:  [97.4 F (36.3 C)-99.2 F (37.3 C)] 98.5 F (36.9 C) (10/01 0556) Pulse Rate:  [83-102] 102  (10/01 0556) Resp:  [18-20] 20  (10/01 0556) BP: (138-157)/(61-82) 156/82 mmHg (10/01 0556) SpO2:  [93 %-96 %] 93 % (10/01 0838) Weight:  [149 lb 9.6 oz (67.858 kg)] 149 lb 9.6 oz (67.858 kg) (10/01 0556) Last BM Date: 02/27/12  Intake/Output from previous day: 09/30 0701 - 10/01 0700 In: 2245.8 [I.V.:1845.8; IV Piggyback:400] Out: 1700 [Urine:650; Emesis/NG output:1050]  Physical Examination: General: alert, cooperative and no distress Resp: Mildly diminished in the bases Cardio: irregularly irregular rhythm GI: soft, non-tender; bowel sounds normal; no masses,  no organomegaly and incision: midline with steri strips clean, dry, and intact.  Mild ecchymosis present. Extremities: extremities normal, atraumatic, no cyanosis or edema  Labs: WBC/Hgb/Hct/Plts:  10.1/9.7/28.3/373 (10/01 0415) BUN/Cr/glu/ALT/AST/amyl/lip:  9/0.55/--/--/--/--/-- (10/01 0415)  Radiology: Ct Angio Chest Pe W/cm &/or Wo Cm 02/23/2012  IMPRESSION: 1. No evidence of central pulmonary embolism. Suboptimal evaluation of the lower lobe segmental pulmonary arteries due to respiratory motion. If high clinical concern, consider VQ scan. 2. No evidence of pneumothorax or pulmonary edema. 3. Small amount of intraperitoneal free fluid. Original Report Authenticated By: Suzy Bouchard, M.D.  Ct Abdomen Pelvis W Contrast 02/23/2012:  IMPRESSION: Multiple dilated loops of small and large bowel, as described above. The appearance remains most  compatible with adynamic ileus in the recent postoperative setting. Follow-up radiographs are suggested. Status post hysterectomy and bilateral salpingo-oophorectomy. Associated postsurgical changes with abdominopelvic ascites and a small amount of layering hemorrhage. Original Report Authenticated By: Julian Hy, M.D.  [facility] Chest Port 1 View 02/23/2012:  IMPRESSION: Suboptimal inspiration with atelectasis at the left lung base. No acute cardiopulmonary disease otherwise. Original Report Authenticated By: Deniece Portela, M.D.  DG Abd 2 View 02/25/12: IMPRESSION:  Improved appearance of the abdomen/pelvis. Persistent small bowel dilatation, with normal gas filled colon (new finding). This suggests evolving postoperative adynamic ileus. Partial small bowel obstruction cannot be entirely excluded. DG Abd 1 View 02/26/12: IMPRESSION:  Slight improvement in probable postoperative adynamic ileus compared to 02/25/2012. Consider continued radiographic follow-up, given the small bowel dilatation up to 4.8 cm.   Assessment: 71 y.o. s/p Procedure(s): EXPLORATORY LAPAROTOMY HYSTERECTOMY ABDOMINAL SALPINGO OOPHERECTOMY: stable Pain:  Pain is well-controlled.  IV Dilaudid ordered as needed.  ID:  Urine culture from 02/22/12:  >=100,000 COLONIES/ML Culture MORGANELLA MORGANII.  Currently on Cipro IV.    CV: Hypertension: Stable.  Current treatment:  Metoprolol 5 mg IV Q6H, Hydralazine 10 mg IV Q6H PRN.  On Telemetry.  Heart rate and rhythm remains irregular.    Resp:  No wheezing this am.  Improvement with albuterol neb treatments.  GI:  Tolerating po: No: NPO.  NG in place.  Reporting 2 bowel movements.  1050 cc NG output from 02/26/12 to 02/27/12.  300 cc out since 7 am with output light green to clear.  DG Abd 1 View 02/26/12: IMPRESSION:  Slight improvement in probable postoperative adynamic ileus compared to 02/25/2012. Consider continued radiographic follow-up, given the small bowel dilatation up to 4.8  cm.  GI stress ulcer prophylaxis includes: proton pump inhibitor  per orders. Treatment for N/V: Zofran intravenous.  GU:  650 cc urine output from 02/26/12 to 02/27/12.  On Cipro IV for urinary tract infection.  FEN:  Hypokalemia, K+ 3.2 this am.  On 02/26/12, K+ 3.1.  Endo: Diabetes mellitus Type II, under good control..  CBG:  CBG (last 3)   Basename 02/27/12 1155 02/27/12 0754 02/27/12 0407  GLUCAP 102* 118* 102*   Prophylaxis: pharmacologic prophylaxis (with any of the following: enoxaparin (Lovenox) 40mg  SQ 2 hours prior to surgery then every day) and intermittent pneumatic compression boots.  Plan: Plan to round later today with Dr. Delsa Sale Continue IV Cipro Appreciate Gen. Surgery recommendations for discontinuing NG tube and starting liquids this pm Replace K+ Continue Lovenox for DVT prophylaxis OOB with assist from the nursing staff and during PT visits Continue daily weights Encourage ambulation Social Work following for discharge placement   LOS: 7 days    CROSS, MELISSA DEAL 02/27/2012, 12:05 PM

## 2012-02-27 NOTE — Progress Notes (Signed)
7 Days Post-Op Procedure(s) (LRB): EXPLORATORY LAPAROTOMY (N/A) HYSTERECTOMY ABDOMINAL (N/A) SALPINGO OOPHERECTOMY (Bilateral)  Subjective: Patient reports no complaints.  Denies nausea, vomiting, abdominal pain, chest pain, dyspnea, or passing flatus.  Reporting having one bowel movement last pm and this am.  Tolerating sips of clear liquids after NG removal.  Objective: Vital signs in last 24 hours: Temp:  [97.4 F (36.3 C)-99.2 F (37.3 C)] 98.5 F (36.9 C) (10/01 0556) Pulse Rate:  [84-102] 102  (10/01 0556) Resp:  [18-20] 20  (10/01 0556) BP: (138-157)/(61-82) 156/82 mmHg (10/01 0556) SpO2:  [93 %-96 %] 93 % (10/01 0838) Weight:  [149 lb 9.6 oz (67.858 kg)] 149 lb 9.6 oz (67.858 kg) (10/01 0556) Last BM Date: 02/27/12  Intake/Output from previous day: 09/30 0701 - 10/01 0700 In: 2245.8 [I.V.:1845.8; IV Piggyback:400] Out: 1700 [Urine:650; Emesis/NG output:1050]  Physical Examination: General: alert, cooperative and no distress Resp: Mildly diminished in the bases Cardio: irregularly irregular rhythm GI: soft, non-tender; bowel sounds normal; no masses,  no organomegaly and incision: midline with steri strips clean, dry, and intact.  Mild ecchymosis present. Extremities: extremities normal, atraumatic, no cyanosis or edema  Labs: WBC/Hgb/Hct/Plts:  10.1/9.7/28.3/373 (10/01 0415) BUN/Cr/glu/ALT/AST/amyl/lip:  9/0.55/--/--/--/--/-- (10/01 0415)  Radiology: Ct Angio Chest Pe W/cm &/or Wo Cm 02/23/2012  IMPRESSION: 1. No evidence of central pulmonary embolism. Suboptimal evaluation of the lower lobe segmental pulmonary arteries due to respiratory motion. If high clinical concern, consider VQ scan. 2. No evidence of pneumothorax or pulmonary edema. 3. Small amount of intraperitoneal free fluid. Original Report Authenticated By: Suzy Bouchard, M.D.  Ct Abdomen Pelvis W Contrast 02/23/2012:  IMPRESSION: Multiple dilated loops of small and large bowel, as described above. The  appearance remains most compatible with adynamic ileus in the recent postoperative setting. Follow-up radiographs are suggested. Status post hysterectomy and bilateral salpingo-oophorectomy. Associated postsurgical changes with abdominopelvic ascites and a small amount of layering hemorrhage. Original Report Authenticated By: Julian Hy, M.D.  [facility] Chest Port 1 View 02/23/2012:  IMPRESSION: Suboptimal inspiration with atelectasis at the left lung base. No acute cardiopulmonary disease otherwise. Original Report Authenticated By: Deniece Portela, M.D.  DG Abd 2 View 02/25/12: IMPRESSION:  Improved appearance of the abdomen/pelvis. Persistent small bowel dilatation, with normal gas filled colon (new finding). This suggests evolving postoperative adynamic ileus. Partial small bowel obstruction cannot be entirely excluded. DG Abd 1 View 02/26/12: IMPRESSION:  Slight improvement in probable postoperative adynamic ileus compared to 02/25/2012. Consider continued radiographic follow-up, given the small bowel dilatation up to 4.8 cm.   Assessment: 71 y.o. s/p Procedure(s): EXPLORATORY LAPAROTOMY HYSTERECTOMY ABDOMINAL SALPINGO OOPHERECTOMY: stable Pain:  Pain is well-controlled.  IV Dilaudid ordered as needed.  ID:  Urine culture from 02/22/12:  >=100,000 COLONIES/ML Culture MORGANELLA MORGANII.  Currently on Cipro IV.    CV: Hypertension: Stable.  Current treatment:  Metoprolol 5 mg IV Q6H, Hydralazine 10 mg IV Q6H PRN.  On Telemetry.  Heart rate and rhythm remains irregular.    Resp:  No wheezing this am.  Improvement with albuterol neb treatments.  GI:  Tolerating po: Yes- clear liquids.  NG removed.  Reporting 2 bowel movements.      GU:  650 cc urine output from 02/26/12 to 02/27/12.  On Cipro IV for urinary tract infection.  FEN:  Hypokalemia, K+ 3.2 this am.  On 02/26/12, K+ 3.1.  Endo: Diabetes mellitus Type II, under good control..  CBG:  CBG (last 3)   Basename 02/27/12 1155 02/27/12  0754 02/27/12 0407  GLUCAP 102* 118* 102*   Prophylaxis: pharmacologic prophylaxis (with any of the following: enoxaparin (Lovenox) 40mg  SQ 2 hours prior to surgery then every day) and intermittent pneumatic compression boots.  Plan: Clear liquid diet Continue IV Cipro Plan to transition to oral medications this afternoon if tolerating liquid diet Replace K+ Continue Lovenox for DVT prophylaxis OOB with assist from the nursing staff and during PT visits Continue daily weights Encourage ambulation Social Work following for discharge placement   LOS: 7 days    Brittni Hult DEAL 02/27/2012, 1:35 PM

## 2012-02-28 LAB — BASIC METABOLIC PANEL
CO2: 27 mEq/L (ref 19–32)
Calcium: 8.2 mg/dL — ABNORMAL LOW (ref 8.4–10.5)
Creatinine, Ser: 0.61 mg/dL (ref 0.50–1.10)
GFR calc Af Amer: >90 mL/min (ref >90)
GFR calc non Af Amer: 88 mL/min — ABNORMAL LOW (ref >90)
Sodium: 136 mEq/L (ref 135–145)

## 2012-02-28 LAB — CBC
MCH: 30.4 pg (ref 26.0–34.0)
MCHC: 35.3 g/dL (ref 30.0–36.0)
MCV: 86.3 fL (ref 78.0–100.0)
Platelets: 362 10*3/uL (ref 150–400)
RDW: 13.3 % (ref 11.5–15.5)

## 2012-02-28 LAB — GLUCOSE, CAPILLARY
Glucose-Capillary: 120 mg/dL — ABNORMAL HIGH (ref 70–99)
Glucose-Capillary: 136 mg/dL — ABNORMAL HIGH (ref 70–99)
Glucose-Capillary: 159 mg/dL — ABNORMAL HIGH (ref 70–99)
Glucose-Capillary: 257 mg/dL — ABNORMAL HIGH (ref 70–99)

## 2012-02-28 LAB — MAGNESIUM: Magnesium: 1.5 mg/dL (ref 1.5–2.5)

## 2012-02-28 MED ORDER — PANTOPRAZOLE SODIUM 40 MG PO TBEC
40.0000 mg | DELAYED_RELEASE_TABLET | Freq: Every day | ORAL | Status: DC
  Administered 2012-02-29 – 2012-03-01 (×2): 40 mg via ORAL
  Filled 2012-02-28 (×2): qty 1

## 2012-02-28 MED ORDER — BENAZEPRIL HCL 20 MG PO TABS
20.0000 mg | ORAL_TABLET | Freq: Every day | ORAL | Status: DC
  Administered 2012-02-28 – 2012-03-01 (×3): 20 mg via ORAL
  Filled 2012-02-28 (×3): qty 1

## 2012-02-28 MED ORDER — AMLODIPINE BESYLATE 5 MG PO TABS
5.0000 mg | ORAL_TABLET | Freq: Every day | ORAL | Status: DC
  Administered 2012-02-28 – 2012-03-01 (×3): 5 mg via ORAL
  Filled 2012-02-28 (×3): qty 1

## 2012-02-28 MED ORDER — POTASSIUM CHLORIDE CRYS ER 20 MEQ PO TBCR
30.0000 meq | EXTENDED_RELEASE_TABLET | Freq: Two times a day (BID) | ORAL | Status: DC
  Administered 2012-02-28 – 2012-02-29 (×4): 30 meq via ORAL
  Filled 2012-02-28 (×6): qty 1

## 2012-02-28 MED ORDER — AMLODIPINE BESY-BENAZEPRIL HCL 5-20 MG PO CAPS: 1.0000 | ORAL_CAPSULE | Freq: Every morning | ORAL | Status: DC

## 2012-02-28 MED ORDER — CIPROFLOXACIN HCL 500 MG PO TABS
500.0000 mg | ORAL_TABLET | Freq: Two times a day (BID) | ORAL | Status: DC
  Administered 2012-02-28: 500 mg via ORAL
  Filled 2012-02-28 (×4): qty 1

## 2012-02-28 MED ORDER — MAGNESIUM OXIDE 400 (241.3 MG) MG PO TABS: 400.0000 mg | ORAL_TABLET | Freq: Two times a day (BID) | ORAL | Status: DC

## 2012-02-28 MED ORDER — CARVEDILOL 12.5 MG PO TABS
12.5000 mg | ORAL_TABLET | Freq: Two times a day (BID) | ORAL | Status: DC
  Administered 2012-02-28 – 2012-03-01 (×5): 12.5 mg via ORAL
  Filled 2012-02-28 (×7): qty 1

## 2012-02-28 MED ORDER — INSULIN ASPART 100 UNIT/ML ~~LOC~~ SOLN
0.0000 [IU] | Freq: Three times a day (TID) | SUBCUTANEOUS | Status: DC
  Administered 2012-02-28: 8 [IU] via SUBCUTANEOUS
  Administered 2012-02-28 – 2012-03-01 (×5): 3 [IU] via SUBCUTANEOUS

## 2012-02-28 NOTE — Progress Notes (Signed)
8 Days Post-Op Procedure(s) (LRB): EXPLORATORY LAPAROTOMY (N/A) HYSTERECTOMY ABDOMINAL (N/A) SALPINGO OOPHERECTOMY (Bilateral)  Subjective: Patient reports no complaints.  Requesting coffee with milk.  Asking why her daughter had brought her clothes from her "home" at assisted living.  "Am I not going back there?"  Patient informed that she will be going to a rehabilitation facility after discharge in order to build her strength.  Patient verbalizing understanding.  Reporting having three bowel movements and episodes of flatus yesterday.  Tolerating clear liquids.  Denies nausea, vomiting, abdominal pain, chest pain, or dyspnea.    Objective: Vital signs in last 24 hours: Temp:  [97.8 F (36.6 C)-98.1 F (36.7 C)] 98 F (36.7 C) (10/02 0639) Pulse Rate:  [86-98] 98  (10/02 0639) Resp:  [18-20] 19  (10/02 0639) BP: (143-156)/(66-80) 143/75 mmHg (10/02 0639) SpO2:  [95 %-100 %] 95 % (10/02 0835) Weight:  [145 lb 3.2 oz (65.862 kg)] 145 lb 3.2 oz (65.862 kg) (10/02 0639) Last BM Date: 02/27/12  Intake/Output from previous day: 10/01 0701 - 10/02 0700 In: 1855.4 [P.O.:480; I.V.:1175.4; IV Piggyback:200] Out: 2900 [Urine:2525; Emesis/NG output:375]  Physical Examination: General: alert, cooperative and no distress Resp: Mildly diminished in the bases Cardio: irregularly irregular rhythm GI: soft, non-tender; bowel sounds normal; no masses,  no organomegaly and incision: midline with steri strips clean, dry, and intact.  Mild ecchymosis present. Extremities: extremities normal, atraumatic, no cyanosis or edema  Labs: WBC/Hgb/Hct/Plts:  9.9/9.8/27.8/362 (10/02 0440) BUN/Cr/glu/ALT/AST/amyl/lip:  6/0.61/--/--/--/--/-- (10/02 0440)  Radiology: Ct Angio Chest Pe W/cm &/or Wo Cm 02/23/2012  IMPRESSION: 1. No evidence of central pulmonary embolism. Suboptimal evaluation of the lower lobe segmental pulmonary arteries due to respiratory motion. If high clinical concern, consider VQ scan. 2. No  evidence of pneumothorax or pulmonary edema. 3. Small amount of intraperitoneal free fluid. Original Report Authenticated By: Suzy Bouchard, M.D.  Ct Abdomen Pelvis W Contrast 02/23/2012:  IMPRESSION: Multiple dilated loops of small and large bowel, as described above. The appearance remains most compatible with adynamic ileus in the recent postoperative setting. Follow-up radiographs are suggested. Status post hysterectomy and bilateral salpingo-oophorectomy. Associated postsurgical changes with abdominopelvic ascites and a small amount of layering hemorrhage. Original Report Authenticated By: Julian Hy, M.D.  [facility] Chest Port 1 View 02/23/2012:  IMPRESSION: Suboptimal inspiration with atelectasis at the left lung base. No acute cardiopulmonary disease otherwise. Original Report Authenticated By: Deniece Portela, M.D.  DG Abd 2 View 02/25/12: IMPRESSION:  Improved appearance of the abdomen/pelvis. Persistent small bowel dilatation, with normal gas filled colon (new finding). This suggests evolving postoperative adynamic ileus. Partial small bowel obstruction cannot be entirely excluded. DG Abd 1 View 02/26/12: IMPRESSION:  Slight improvement in probable postoperative adynamic ileus compared to 02/25/2012. Consider continued radiographic follow-up, given the small bowel dilatation up to 4.8 cm.   Assessment: 71 y.o. s/p Procedure(s): EXPLORATORY LAPAROTOMY HYSTERECTOMY ABDOMINAL SALPINGO OOPHERECTOMY: stable Pain:  Pain is well-controlled.  IV Dilaudid ordered as needed.  ID:  Urine culture from 02/22/12:  >=100,000 COLONIES/ML Culture MORGANELLA MORGANII.  Currently on Cipro IV.    CV: Hypertension: Stable.  Current treatment:  Metoprolol 5 mg IV Q6H, Hydralazine 10 mg IV Q6H PRN.  On Telemetry.  Heart rate and rhythm remains irregular.    Resp:  No wheezing this am.  Improvement with albuterol neb treatments.  GI:  Tolerating po: Yes- clear liquids.  NG removed on 02/27/12.  Reporting 3  bowel movements with episodes of flatus yesterday.      GU:  Urinary output improved.  2525 cc urine output from 02/27/12 to 02/28/12.  On Cipro IV for urinary tract infection.  FEN:  Hypokalemia resolving, K+ 3.4 this am.  On 02/27/12, K+ 3.2.  Endo: Diabetes mellitus Type II, under good control..  CBG:  CBG (last 3)   Basename 02/28/12 0755 02/28/12 0449 02/27/12 2359  GLUCAP 105* 136* 159*   Prophylaxis: pharmacologic prophylaxis (with any of the following: enoxaparin (Lovenox) 40mg  SQ 2 hours prior to surgery then every day) and intermittent pneumatic compression boots.  Plan: Advance diet to full liquids per General Surgery recommendation Transition to oral antihypertensive medications and oral Cipro Continue K+ and Mg replacement.  Mg added on to this am labs and plan to recheck in the am per General Surgery. Saline lock IV Continue Lovenox for DVT prophylaxis OOB with assist from the nursing staff and during PT visits Continue daily weights Encourage ambulation Social Work following for discharge placement   LOS: 8 days    Kendyl Festa DEAL 02/28/2012, 9:55 AM

## 2012-02-28 NOTE — Progress Notes (Signed)
8 Days Post-Op  Subjective: Feels better, 3 BM yesterday, no nausea with PO's  Objective: Vital signs in last 24 hours: Temp:  [97.8 F (36.6 C)-98.1 F (36.7 C)] 98 F (36.7 C) (10/02 0639) Pulse Rate:  [86-98] 98  (10/02 0639) Resp:  [18-20] 19  (10/02 0639) BP: (143-156)/(66-80) 143/75 mmHg (10/02 0639) SpO2:  [95 %-100 %] 95 % (10/02 0835) Weight:  [65.862 kg (145 lb 3.2 oz)] 65.862 kg (145 lb 3.2 oz) (10/02 0639) Last BM Date: 02/27/12 480 PO recorded,  Afebrile, VSS, K+ still low 2 stools recorded,  Intake/Output from previous day: 10/01 0701 - 10/02 0700 In: 1855.4 [P.O.:480; I.V.:1175.4; IV Piggyback:200] Out: 2900 [Urine:2525; Emesis/NG output:375] Intake/Output this shift: Total I/O In: -  Out: 150 [Urine:150]  General appearance: alert, cooperative and no distress GI: soft, a little tender +BS, +flatus, +BM.  Lab Results:   Basename 02/28/12 0440 02/27/12 0415  WBC 9.9 10.1  HGB 9.8* 9.7*  HCT 27.8* 28.3*  PLT 362 373    BMET  Basename 02/28/12 0440 02/27/12 0415  NA 136 134*  K 3.4* 3.2*  CL 98 95*  CO2 27 25  GLUCOSE 140* 98  BUN 6 9  CREATININE 0.61 0.55  CALCIUM 8.2* 8.1*   PT/INR No results found for this basename: LABPROT:2,INR:2 in the last 72 hours  No results found for this basename: AST:5,ALT:5,ALKPHOS:5,BILITOT:5,PROT:5,ALBUMIN:5 in the last 168 hours   Lipase  No results found for this basename: lipase     Studies/Results: No results found.  Medications:    . albuterol  2 puff Inhalation TID  . antiseptic oral rinse  15 mL Mouth Rinse q12n4p  . chlorhexidine  15 mL Mouth Rinse BID  . ciprofloxacin  400 mg Intravenous Q12H  . enoxaparin (LOVENOX) injection  40 mg Subcutaneous Q24H  . feeding supplement  1 Container Oral BID BM  . insulin aspart  0-15 Units Subcutaneous Q4H  . magnesium chloride  2 tablet Oral Daily  . metoprolol  5 mg Intravenous Q6H  . pantoprazole (PROTONIX) IV  40 mg Intravenous Q24H  . potassium  chloride  10 mEq Intravenous Q1 Hr x 3  . risperiDONE  1 mg Oral QHS  . traZODone  50 mg Oral QHS    Assessment/Plan s/p Procedure(s) (LRB) with comments:  EXPLORATORY LAPAROTOMY (N/A)  HYSTERECTOMY ABDOMINAL (N/A)  SALPINGO OOPHERECTOMY (Bilateral)  Ileus resolving Hypertension Diabetes mellitus Type II Dementia  Hx CVA, right sided weakness  Plan:  ADD more KCL, increase to full liquids.  Recheck K+ in AM, check MG+ if still low replace this also today.       LOS: 8 days    Natalie Rodgers 02/28/2012

## 2012-02-28 NOTE — Progress Notes (Signed)
Physical Therapy Treatment Patient Details Name: MAZELLE GUNDER MRN: [id number] DOB: 1940-10-26 Today's Date: 02/28/2012 Time: [id number] PT Time Calculation (min): 18 min  PT Assessment / Plan / Recommendation Comments on Treatment Session  Pt progressing well with ambulation, increasing distance and requires less assist than previous visit.      Follow Up Recommendations  Post acute inpatient rehab;Supervision/Assistance - 24 hour    Barriers to Discharge        Equipment Recommendations  Rolling walker with 5" wheels    Recommendations for Other Services OT consult  Frequency Min 3X/week   Plan Discharge plan remains appropriate    Precautions / Restrictions Precautions Precautions: Fall Precaution Comments: ABD incision Restrictions Weight Bearing Restrictions: No   Pertinent Vitals/Pain No pain    Mobility  Bed Mobility Bed Mobility: Not assessed (Pt already in recliner when PT arrived. ) Transfers Transfers: Sit to Stand;Stand to Sit Sit to Stand: 4: Min assist;3: Mod assist;With upper extremity assist;With armrests;From chair/3-in-1 Stand to Sit: 4: Min assist;With upper extremity assist;With armrests;To chair/3-in-1 Details for Transfer Assistance: Cues for hand placement and safety when sitting/standing.   Ambulation/Gait Ambulation/Gait Assistance: 3: Mod assist Ambulation Distance (Feet): 90 Feet Assistive device: Rolling walker Ambulation/Gait Assistance Details: Assist to steady throughout and for steering RW due to tendency to veer left with cues for for sequencing/technique with RW, increased stride length, upright posture, and safety.  Gait Pattern: Step-through pattern;Decreased stride length;Trunk flexed;Shuffle Gait velocity: decreased    Exercises     PT Diagnosis:    PT Problem List:   PT Treatment Interventions:     PT Goals Acute Rehab PT Goals PT Goal Formulation: With patient Time For Goal Achievement: 03/07/12 Potential to Achieve  Goals: Fair Pt will go Sit to Stand: with min assist PT Goal: Sit to Stand - Progress: Progressing toward goal Pt will Ambulate: 51 - 150 feet;with supervision;with least restrictive assistive device PT Goal: Ambulate - Progress: Goal set today  Visit Information  Last PT Received On: 02/28/12 Assistance Needed: +1    Subjective Data  Subjective: My daughter is coming to see me Thursday Patient Stated Goal: none stated   Cognition  Overall Cognitive Status: Appears within functional limits for tasks assessed/performed Arousal/Alertness: Awake/alert Orientation Level: Appears intact for tasks assessed Behavior During Session: [facility] for tasks performed    Balance     End of Session PT - End of Session Equipment Utilized During Treatment: Gait belt Activity Tolerance: Patient tolerated treatment well Patient left: in chair;with call bell/phone within reach Nurse Communication: Mobility status   GP     Page, Betha Loa 02/28/2012, 12:25 PM

## 2012-02-28 NOTE — Progress Notes (Signed)
Not much else to add or offer.  Will sign off.

## 2012-02-29 LAB — BASIC METABOLIC PANEL
BUN: 7 mg/dL (ref 6–23)
Chloride: 102 mEq/L (ref 96–112)
GFR calc Af Amer: >90 mL/min (ref >90)
Glucose, Bld: 139 mg/dL — ABNORMAL HIGH (ref 70–99)
Potassium: 3.9 mEq/L (ref 3.5–5.1)
Sodium: 138 mEq/L (ref 135–145)

## 2012-02-29 LAB — GLUCOSE, CAPILLARY
Glucose-Capillary: 183 mg/dL — ABNORMAL HIGH (ref 70–99)
Glucose-Capillary: 165 mg/dL — ABNORMAL HIGH (ref 70–99)

## 2012-02-29 MED ORDER — METFORMIN HCL 500 MG PO TABS
1000.0000 mg | ORAL_TABLET | Freq: Two times a day (BID) | ORAL | Status: DC
  Administered 2012-02-29 – 2012-03-01 (×2): 1000 mg via ORAL
  Filled 2012-02-29 (×4): qty 2

## 2012-02-29 MED ORDER — GLIMEPIRIDE 2 MG PO TABS
2.0000 mg | ORAL_TABLET | Freq: Every day | ORAL | Status: DC
  Administered 2012-03-01: 2 mg via ORAL
  Filled 2012-02-29 (×2): qty 1

## 2012-02-29 MED ORDER — CIPROFLOXACIN HCL 500 MG PO TABS
500.0000 mg | ORAL_TABLET | Freq: Two times a day (BID) | ORAL | Status: DC
  Administered 2012-02-29 – 2012-03-01 (×3): 500 mg via ORAL
  Filled 2012-02-29 (×5): qty 1

## 2012-02-29 NOTE — Progress Notes (Signed)
Physical Therapy Treatment Patient Details Name: Natalie Rodgers MRN: [id number] DOB: 06-10-1940 Today's Date: 02/29/2012 Time: [id number] PT Time Calculation (min): 18 min  PT Assessment / Plan / Recommendation Comments on Treatment Session  Pt continues to progress well with ambulation.  Pt agreed to do some exercises, however only a few before declining more.     Follow Up Recommendations  Post acute inpatient rehab;Supervision/Assistance - 24 hour    Barriers to Discharge        Equipment Recommendations  Rolling walker with 5" wheels    Recommendations for Other Services OT consult  Frequency Min 3X/week   Plan Discharge plan remains appropriate    Precautions / Restrictions Precautions Precautions: Fall Precaution Comments: ABD incision Restrictions Weight Bearing Restrictions: No   Pertinent Vitals/Pain No pain    Mobility  Bed Mobility Bed Mobility: Supine to Sit;Sitting - Scoot to Edge of Bed Supine to Sit: 4: Min assist Sitting - Scoot to Edge of Bed: 3: Mod assist Details for Bed Mobility Assistance: HOB elevated approx 45 deg.  Requires min assist for trunk to attain sitting position with cues for L UE placement on bed to self assist.  Transfers Transfers: Sit to Stand;Stand to Sit Sit to Stand: 4: Min assist;From elevated surface;With upper extremity assist;From bed Stand to Sit: 4: Min guard;With upper extremity assist;With armrests;To chair/3-in-1 Details for Transfer Assistance: Cues for hand placement and safety when sitting/standing.  Ambulation/Gait Ambulation/Gait Assistance: 4: Min assist Ambulation Distance (Feet): 65 Feet Assistive device: Rolling walker Ambulation/Gait Assistance Details: Pt doing somewhat better with steering RW on R side, however still requires some assist, esp when turning.  cues for sequencing/technique and to maintain upright posture.  Gait Pattern: Step-through pattern;Decreased stride length;Trunk flexed;Shuffle Gait  velocity: decreased    Exercises General Exercises - Lower Extremity Ankle Circles/Pumps: AROM;Both;15 reps Quad Sets: Strengthening;Both;10 reps Hip ABduction/ADduction: Strengthening;Both;10 reps (Did ADD with pillow squeeze)   PT Diagnosis:    PT Problem List:   PT Treatment Interventions:     PT Goals Acute Rehab PT Goals PT Goal Formulation: With patient Time For Goal Achievement: 03/07/12 Potential to Achieve Goals: Fair Pt will go Supine/Side to Sit: with min assist;with HOB 0 degrees PT Goal: Supine/Side to Sit - Progress: Progressing toward goal Pt will go Sit to Stand: with supervision PT Goal: Sit to Stand - Progress: Updated due to goal met Pt will Ambulate: 51 - 150 feet;with supervision;with least restrictive assistive device PT Goal: Ambulate - Progress: Progressing toward goal  Visit Information  Last PT Received On: 02/29/12 Assistance Needed: +1    Subjective Data  Subjective: I wish I hadn't walked.  Patient Stated Goal: none stated   Cognition  Overall Cognitive Status: Appears within functional limits for tasks assessed/performed Arousal/Alertness: Awake/alert Orientation Level: Appears intact for tasks assessed Behavior During Session: [facility] for tasks performed    Balance     End of Session PT - End of Session Equipment Utilized During Treatment: Gait belt Activity Tolerance: Patient tolerated treatment well Patient left: in chair;with call bell/phone within reach Nurse Communication: Mobility status   GP     Page, Betha Loa 02/29/2012, 3:57 PM

## 2012-02-29 NOTE — Progress Notes (Signed)
9 Days Post-Op Procedure(s) (LRB): EXPLORATORY LAPAROTOMY (N/A) HYSTERECTOMY ABDOMINAL (N/A) SALPINGO OOPHERECTOMY (Bilateral)  Subjective: Patient reports no complaints.  Reporting having a bowel movement this am.  Tolerating full liquid diet.  Denies nausea, vomiting, abdominal pain, chest pain, or dyspnea.    Objective: Vital signs in last 24 hours: Temp:  [98 F (36.7 C)-99.4 F (37.4 C)] 98 F (36.7 C) (10/03 0500) Pulse Rate:  [77-92] 78  (10/03 0500) Resp:  [18-20] 18  (10/03 0500) BP: (115-173)/(52-73) 148/70 mmHg (10/03 0500) SpO2:  [94 %-99 %] 94 % (10/03 0854) Weight:  [143 lb 15.4 oz (65.3 kg)] 143 lb 15.4 oz (65.3 kg) (10/03 0500) Last BM Date: 02/27/12  Intake/Output from previous day: 10/02 0701 - 10/03 0700 In: 600 [P.O.:600] Out: 550 [Urine:550]  Physical Examination: General: alert, cooperative and no distress Resp: Mildly diminished in the bases Cardio: irregularly irregular rhythm GI: soft, non-tender; bowel sounds normal; no masses,  no organomegaly and incision: midline with steri strips clean, dry, and intact.  Mild ecchymosis present. Extremities: extremities normal, atraumatic, no cyanosis or edema  Labs:   BUN/Cr/glu/ALT/AST/amyl/lip:  7/0.62/--/--/--/--/-- (10/03 0435)  Radiology: Ct Angio Chest Pe W/cm &/or Wo Cm 02/23/2012  IMPRESSION: 1. No evidence of central pulmonary embolism. Suboptimal evaluation of the lower lobe segmental pulmonary arteries due to respiratory motion. If high clinical concern, consider VQ scan. 2. No evidence of pneumothorax or pulmonary edema. 3. Small amount of intraperitoneal free fluid. Original Report Authenticated By: Suzy Bouchard, M.D.  Ct Abdomen Pelvis W Contrast 02/23/2012:  IMPRESSION: Multiple dilated loops of small and large bowel, as described above. The appearance remains most compatible with adynamic ileus in the recent postoperative setting. Follow-up radiographs are suggested. Status post hysterectomy and  bilateral salpingo-oophorectomy. Associated postsurgical changes with abdominopelvic ascites and a small amount of layering hemorrhage. Original Report Authenticated By: Julian Hy, M.D.  [facility] Chest Port 1 View 02/23/2012:  IMPRESSION: Suboptimal inspiration with atelectasis at the left lung base. No acute cardiopulmonary disease otherwise. Original Report Authenticated By: Deniece Portela, M.D.  DG Abd 2 View 02/25/12: IMPRESSION:  Improved appearance of the abdomen/pelvis. Persistent small bowel dilatation, with normal gas filled colon (new finding). This suggests evolving postoperative adynamic ileus. Partial small bowel obstruction cannot be entirely excluded. DG Abd 1 View 02/26/12: IMPRESSION:  Slight improvement in probable postoperative adynamic ileus compared to 02/25/2012. Consider continued radiographic follow-up, given the small bowel dilatation up to 4.8 cm.   Assessment: 71 y.o. s/p Procedure(s): EXPLORATORY LAPAROTOMY HYSTERECTOMY ABDOMINAL SALPINGO OOPHERECTOMY: stable Pain:  Pain is well-controlled.  Percocet ordered as needed.  ID:  Urine culture from 02/22/12:  >=100,000 COLONIES/ML Culture MORGANELLA MORGANII.  Currently on Cipro PO.    CV: Hypertension: Stable.  Current treatment:  Coreg, Norvasc, and Lotensin.  On Telemetry.  Heart rate and rhythm remains irregular.    Resp:  No wheezing this am.  Improvement with albuterol neb treatments.  GI:  Tolerating po: Yes- full liquids.  NG removed on 02/27/12.  Reporting bowel movement this am.      GU:  Urinary output improved.  On Cipro PO for urinary tract infection.  FEN:  Hypokalemia resolved, K+ 3.9 this am.  Magnesium 1.5 this am.   Endo: Diabetes mellitus Type II, under good control..  CBG:  CBG (last 3)   Basename 02/28/12 2051 02/28/12 1610 02/28/12 1204  GLUCAP 120* 257* 179*   Prophylaxis: pharmacologic prophylaxis (with any of the following: enoxaparin (Lovenox) 40mg  SQ 2 hours prior to  surgery then every  day) and intermittent pneumatic compression boots.  Plan: Advance diet to Carb Modified diet Restart oral diabetic medications including Amaryl and Metformin Continue oral Cipro for a total of ten days Continue K+ and Mg replacement   Saline lock IV Continue Lovenox for DVT prophylaxis OOB with assist from the nursing staff and during PT visits Continue daily weights Encourage ambulation Social Work following for discharge placement.  Plan for discharge to rehab facility in the am   LOS: 9 days    Natalie Rodgers DEAL 02/29/2012, 9:06 AM

## 2012-03-01 LAB — GLUCOSE, CAPILLARY
Glucose-Capillary: 163 mg/dL — ABNORMAL HIGH (ref 70–99)
Glucose-Capillary: 113 mg/dL — ABNORMAL HIGH (ref 70–99)

## 2012-03-01 MED ORDER — CIPROFLOXACIN HCL 500 MG PO TABS: 500.0000 mg | ORAL_TABLET | Freq: Two times a day (BID) | ORAL | Status: AC

## 2012-03-01 MED ORDER — ALBUTEROL SULFATE HFA 108 (90 BASE) MCG/ACT IN AERS: 1.0000 | INHALATION_SPRAY | Freq: Four times a day (QID) | RESPIRATORY_TRACT | Status: AC | PRN

## 2012-03-01 MED ORDER — OXYCODONE-ACETAMINOPHEN 5-325 MG PO TABS: 1.0000 | ORAL_TABLET | Freq: Four times a day (QID) | ORAL | Status: AC | PRN

## 2012-03-01 NOTE — Discharge Summary (Signed)
Physician Discharge Summary  Patient ID: Natalie Rodgers MRN: [id number] DOB/AGE: August 07, 1940 71 y.o.  Admit date: 02/20/2012 Discharge date: 03/01/2012  Admission Diagnoses: Abdominal mass, RLQ (right lower quadrant)  Discharge Diagnoses:  Principal Problem:  *Abdominal mass, RLQ (right lower quadrant) Active Problems:  UTI (lower urinary tract infection)  Adynamic ileus  Shortness of breath  Diabetes mellitus  Hypertension  Paroxysmal supraventricular tachycardia  PAC (premature atrial contraction)  Volume excess  Discharged Condition: Patient is stable and in good condition at discharge  Hospital Course: On 02/20/2012, the patient underwent the following: Procedure(s): EXPLORATORY LAPAROTOMY, HYSTERECTOMY ABDOMINAL, SALPINGO OOPHERECTOMY.  Post-operatively, she was noted to have an irregularly irregular cardiac rhythm with rates between at 77-116 bpm.  EKG on 02/23/12 am resulting sinus tach with premature atrial complexes. Pre-op EKG showed sinus rhythm with premature atrial complexes.  At this time, she developed episodes of wheezing.  A CT angiogram was performed on 02/23/12, which was negative for pulmonary embolus.  Furthermore, between post operative day 2 and 3, her abdomen became distended and firm with no bowel movements or flatus along with episodes of emesis.  A nasogastric tube was placed on 02/23/12 with 2,050 cc out in a 24 hour period.  A CT scan of the abdomen and pelvis on 02/23/12 revealed findings most compatible with adynamic ileus.  In addition, urine output was decreased during the post-operative course requiring IV hydration.  A urine culture performed from 02/22/12 was positive for morganella morganii and she was placed on IV Cipro.  A cardiology consult was initiated to evaluate the patient's cardiac status and new onset of sinus tach with premature atrial complexes.  General surgery was also consulted along with the Hospitalist team to assist with post-operative  management.  In addition, she was transferred to telemetry for close monitoring.  She was discharged to [facility] on postoperative day 10 tolerating a carb modified diet.  She is to follow up with cardiology after discharge.  Consults:  Cardiology, General Surgery, Hospitalist, PT, and Respiratory  Significant Diagnostic Studies: labs:  Performed daily from 02/21/12 to 02/28/12.  Most recent below: CBC    Component Value Date/Time   WBC 9.9 02/28/2012 0440   RBC 3.22* 02/28/2012 0440   HGB 9.8* 02/28/2012 0440   HCT 27.8* 02/28/2012 0440   PLT 362 02/28/2012 0440   MCV 86.3 02/28/2012 0440   MCH 30.4 02/28/2012 0440   MCHC 35.3 02/28/2012 0440   RDW 13.3 02/28/2012 0440   LYMPHSABS 0.7 02/23/2012 1555   MONOABS 2.0* 02/23/2012 1555   EOSABS 0.0 02/23/2012 1555   BASOSABS 0.0 02/23/2012 1555   BMET    Component Value Date/Time   NA 138 02/29/2012 0435   K 3.9 02/29/2012 0435   CL 102 02/29/2012 0435   CO2 27 02/29/2012 0435   GLUCOSE 139* 02/29/2012 0435   BUN 7 02/29/2012 0435   CREATININE 0.62 02/29/2012 0435   CREATININE 0.62 01/02/2012 1258   CALCIUM 8.4 02/29/2012 0435   GFRNONAA 87* 02/29/2012 0435   GFRAA >90 02/29/2012 0435   Microbiology:  urine culture: positive for >=100,000 COLONIES/ML Culture [facility] MORGANII  Radiology:  Ct Angio Chest Pe W/cm &/or Wo Cm 02/23/2012 IMPRESSION: 1. No evidence of central pulmonary embolism. Suboptimal evaluation of the lower lobe segmental pulmonary arteries due to respiratory motion. If high clinical concern,  consider VQ scan. 2. No evidence of pneumothorax or pulmonary edema. 3. Small amount of intraperitoneal free fluid. Original Report Authenticated By: Suzy Bouchard, M.D.  Ct Abdomen Pelvis W Contrast 02/23/2012: IMPRESSION: Multiple dilated loops of small and large bowel, as described above. The appearance remains most compatible with adynamic ileus in the recent postoperative setting. Follow-up     radiographs are suggested.  Status post hysterectomy and bilateral salpingo-oophorectomy. Associated postsurgical changes with abdominopelvic ascites and a small amount of layering hemorrhage. Original Report Authenticated By: Julian Hy, M.D.  [facility] Chest Port 1 View 02/23/2012: IMPRESSION: Suboptimal inspiration with atelectasis at the left lung base. No acute cardiopulmonary disease otherwise. Original Report Authenticated By: Deniece Portela, M.D.  DG Abd 2 View 02/25/12: IMPRESSION: Improved appearance of the abdomen/pelvis. Persistent small bowel dilatation, with normal gas filled colon (new finding). This suggests evolving postoperative adynamic ileus. Partial small bowel       obstruction cannot be entirely excluded.  DG Abd 1 View 02/26/12: IMPRESSION: Slight improvement in probable postoperative adynamic ileus compared to 02/25/2012. Consider continued radiographic follow-up, given the small bowel dilatation up to 4.8 cm.   Cardiac:  Irregularly irregular rhythm noted post-operatively with rates between at 77-116 bpm. EKG on 02/23/12 am resulting sinus tach with premature atrial complexes. Pre-op EKG showed sinus rhythm with premature atrial complexes. Per     Lynnell Catalan, NP who manages her primary care at the assisted living facility, PACs are a new finding for the patient. She reports managing the patient's antihypertensive medications with past EKGs in their system as normal sinus rhythm.     Patient does not see a cardiologist.    Respiratory: Development of wheezing intermittently post operatively. Portable chest x ray this am showed: suboptimal inspiration with atelectasis at the left lung base. No acute cardiopulmonary disease otherwise. O2 saturation 100% on 2 L.    Treatments: IV hydration, antibiotics: Cipro IV to treat urinary tract infection, cardiac meds:  IV Metoprolol and hydralazine IV as needed, anticoagulation: LMW heparin, respiratory therapy: albuterol/atropine nebulizer and surgery: See  above  Discharge Exam: Blood pressure 140/65, pulse 80, temperature 98.6 F (37 C), temperature source Oral, resp. rate 18, height 5\' 3"  (1.6 m), weight 144 lb 10 oz (65.6 kg), SpO2 98.00%. General appearance: alert, cooperative and no distress Resp: clear to auscultation bilaterally Cardio: irregularly irregular rhythm and rate 88 bpm GI: soft, non-tender; bowel sounds normal; no masses,  no organomegaly Extremities: extremities normal, atraumatic, no cyanosis or edema Incision/Wound: Midline incision with steri strips clean, dry, and intact.  Mild ecchymosis around the incision healing  Disposition:  Discharge Orders    Future Appointments: Provider: Department: Dept Phone: Center:   04/04/2012 2:15 PM Janie Morning, MD PHD [facility] [phone] None       Medication List     As of 03/01/2012  9:05 AM    ASK your doctor about these medications         amLODipine-benazepril 5-20 MG per capsule   Commonly known as: LOTREL   Take 1 capsule by mouth every morning.      aspirin 81 MG chewable tablet   Chew 81 mg by mouth every morning.      atorvastatin 20 MG tablet   Commonly known as: LIPITOR   Take 20 mg by mouth at bedtime.      carvedilol 12.5 MG tablet   Commonly known as: COREG   Take 12.5 mg by mouth 2 (two) times daily with a meal.      donepezil 10 MG tablet   Commonly known as: ARICEPT   Take 10 mg by mouth at bedtime.  glimepiride 2 MG tablet   Commonly known as: AMARYL   Take 2 mg by mouth daily before breakfast.      hydrochlorothiazide 25 MG tablet   Commonly known as: HYDRODIURIL   Take 25 mg by mouth at bedtime.      Melatonin 1 MG Tabs   Take 1 mg by mouth at bedtime.      metFORMIN 1000 MG tablet   Commonly known as: GLUCOPHAGE   Take 1,000 mg by mouth 2 (two) times daily with a meal.      omeprazole 20 MG capsule   Commonly known as: PRILOSEC   Take 20 mg by mouth daily.      risperiDONE 1 MG tablet   Commonly known as:  RISPERDAL   Take 1 mg by mouth at bedtime.      traZODone 50 MG tablet   Commonly known as: DESYREL   Take 50 mg by mouth at bedtime.      Vitamin D3 1000 UNITS Caps   Take 1,000 Units by mouth every morning.           Follow-up Information    Follow up with Janie Morning, MD PHD. On 04/04/2012. (at 2:15pm.  Arrive at 1:45pm to register)    Contact information:   [place] [address] [phone]         Signed: Merwyn Hodapp, [place] 03/01/2012, 9:05 AM

## 2012-03-01 NOTE — Discharge Summary (Signed)
Called the office of Natalie Catalan, FNP for [facility] to see about arranging a new patient visit with a cardiologist.  Informed staff that the patient would be going to [facility] at discharge and would not be returning to assisted living.  Philis Nettle, RN stating that the practice only works with patients in assisted living and not skilled facilities.  Stating that the skilled nursing facility would arrange for care with their in-house providers.

## 2012-03-01 NOTE — Progress Notes (Signed)
Patient cleared for discharge to [facility]. Packet copied and placed in [facility]. ptar called for transportation. Daughter informed.  Billye Nydam C. [place] MSW, [place]

## 2012-03-01 NOTE — Discharge Instructions (Addendum)
03/01/2012  Activity: 1. Be up and out of the bed during the day.  Take a nap if needed.  You may walk up steps but be careful and use the hand rail.  Stair climbing will tire you more than you think, you may need to stop part way and rest.   2. No lifting or straining for 6 weeks.  3.Do Not drive if you are taking narcotic pain medicine.  4. Shower daily.  Use soap and water on your incision and pat dry; don't rub.   5. No sexual activity and nothing in the vagina for 6 weeks.  Diet: 1. Low sodium Heart Healthy Diet is recommended.  2. It is safe to use a laxative if you have difficulty moving your bowels.   Wound Care: 1. Keep clean and dry.  Shower daily.  Reasons to call the Doctor:  Fever - Oral temperature greater than 100.4 degrees Fahrenheit  Foul-smelling vaginal discharge  Difficulty urinating  Nausea and vomiting  Increased pain at the site of the incision that is unrelieved with pain medicine.  Difficulty breathing with or without chest pain  New calf pain especially if only on one side  Sudden, continuing increased vaginal bleeding with or without clots.   Contacts: For questions or concerns you should contact:  Dr. Lahoma Crocker at [phone]  Dr. Skeet Latch at [place]   Hysterectomy Information A hysterectomy is a surgery to remove your uterus. After surgery, you will not have periods (menses) or be able to get pregnant.  REASONS FOR THIS SURGERY  You have bleeding that is not normal and keeps coming back.  You have lasting (chronic) lower belly (pelvic) pain.  You have a lasting infection.  The lining of your uterus grows outside your uterus.  Your uterus falls down into your vagina.  You have a growth in your uterus that causes problems.  You have cells that could turn into cancer (precancerous cells).  You have cancer of the uterus or cervix. TYPES  There are 3 types of hysterectomies. Depending on the type, the  surgery will:  Remove the top part of the uterus only.  Remove the uterus and the cervix.  Remove the uterus, cervix, and tissue that holds the uterus in place in the lower belly. WAYS A HYSTERECTOMY CAN BE PERFORMED There are 5 ways this surgery can be performed.   A cut (incision) is made in the belly (abdomen). The uterus is taken out through the cut.  A cut is made in the vagina. The uterus is taken out through the cut.  Three to four cuts are made in the belly. A surgical device is put through the cuts. The uterus is cut into small pieces by the surgical device. The uterus is taken out through the cuts or the vagina.  Three or four cuts are made in the belly. A surgical device is put through the cuts. The uterus is taken out through the vagina.  Three or four cuts are made in the belly. A surgical device that is controlled by a computer makes a visual image. The image helps the surgeon control the surgical device. The uterus is cut into small pieces. The pieces are taken out though the cuts or through the vagina. WHAT TO EXPECT AFTER THE SURGERY  You will be given pain medicine.  You will need help at home for 3 to 5 days after surgery.  You will need to see your doctor in 2 to 4 weeks after  surgery.  You may get hot flashes, night sweats, and have trouble sleeping.  You may need to have Pap tests in the future if your surgery was related to cancer. Talk to your doctor. It is still good to have regular exams. Document Released: 08/07/2011 Document Reviewed: 08/07/2011 [facility] Patient Information 2013 [place].

## 2012-04-04 ENCOUNTER — Ambulatory Visit: Payer: Medicare Other | Admitting: Gynecologic Oncology

## 2012-06-04 ENCOUNTER — Encounter (INDEPENDENT_AMBULATORY_CARE_PROVIDER_SITE_OTHER): Payer: Self-pay

## 2013-08-19 ENCOUNTER — Encounter (HOSPITAL_COMMUNITY): Admission: RE | Payer: Self-pay | Source: Ambulatory Visit

## 2013-08-19 ENCOUNTER — Ambulatory Visit (HOSPITAL_COMMUNITY): Admission: RE | Admit: 2013-08-19 | Payer: Medicare Other | Source: Ambulatory Visit | Admitting: Orthopedic Surgery

## 2013-08-19 SURGERY — OPEN REDUCTION INTERNAL FIXATION (ORIF) ANKLE FRACTURE
Anesthesia: Choice | Laterality: Right

## 2015-12-28 ENCOUNTER — Emergency Department (HOSPITAL_COMMUNITY)
Admission: EM | Admit: 2015-12-28 | Discharge: 2015-12-28 | Disposition: A | Discharge status: Home / Self Care | Payer: Medicare Other | Attending: Emergency Medicine | Admitting: Emergency Medicine

## 2015-12-28 ENCOUNTER — Emergency Department (HOSPITAL_COMMUNITY): Payer: Medicare Other

## 2015-12-28 ENCOUNTER — Encounter (HOSPITAL_COMMUNITY): Payer: Self-pay | Admitting: *Deleted

## 2015-12-28 DIAGNOSIS — Z79899 Other long term (current) drug therapy: Secondary | ICD-10-CM | POA: Diagnosis not present

## 2015-12-28 DIAGNOSIS — Z7984 Long term (current) use of oral hypoglycemic drugs: Secondary | ICD-10-CM | POA: Diagnosis not present

## 2015-12-28 DIAGNOSIS — S92321A Displaced fracture of second metatarsal bone, right foot, initial encounter for closed fracture: Secondary | ICD-10-CM | POA: Diagnosis not present

## 2015-12-28 DIAGNOSIS — S99921A Unspecified injury of right foot, initial encounter: Secondary | ICD-10-CM | POA: Diagnosis present

## 2015-12-28 DIAGNOSIS — Z8673 Personal history of transient ischemic attack (TIA), and cerebral infarction without residual deficits: Secondary | ICD-10-CM | POA: Diagnosis not present

## 2015-12-28 DIAGNOSIS — Z7982 Long term (current) use of aspirin: Secondary | ICD-10-CM | POA: Diagnosis not present

## 2015-12-28 DIAGNOSIS — S92341A Displaced fracture of fourth metatarsal bone, right foot, initial encounter for closed fracture: Secondary | ICD-10-CM | POA: Diagnosis not present

## 2015-12-28 DIAGNOSIS — S92334A Nondisplaced fracture of third metatarsal bone, right foot, initial encounter for closed fracture: Secondary | ICD-10-CM | POA: Diagnosis not present

## 2015-12-28 DIAGNOSIS — Y9289 Other specified places as the place of occurrence of the external cause: Secondary | ICD-10-CM | POA: Diagnosis not present

## 2015-12-28 DIAGNOSIS — W010XXA Fall on same level from slipping, tripping and stumbling without subsequent striking against object, initial encounter: Secondary | ICD-10-CM | POA: Diagnosis not present

## 2015-12-28 DIAGNOSIS — E119 Type 2 diabetes mellitus without complications: Secondary | ICD-10-CM | POA: Diagnosis not present

## 2015-12-28 DIAGNOSIS — S92354A Nondisplaced fracture of fifth metatarsal bone, right foot, initial encounter for closed fracture: Secondary | ICD-10-CM | POA: Insufficient documentation

## 2015-12-28 DIAGNOSIS — Y9301 Activity, walking, marching and hiking: Secondary | ICD-10-CM | POA: Insufficient documentation

## 2015-12-28 DIAGNOSIS — S92901A Unspecified fracture of right foot, initial encounter for closed fracture: Secondary | ICD-10-CM

## 2015-12-28 DIAGNOSIS — I10 Essential (primary) hypertension: Secondary | ICD-10-CM | POA: Diagnosis not present

## 2015-12-28 DIAGNOSIS — Y999 Unspecified external cause status: Secondary | ICD-10-CM | POA: Insufficient documentation

## 2015-12-28 MED ORDER — TRAMADOL HCL 50 MG PO TABS: 50.0000 mg | ORAL_TABLET | Freq: Four times a day (QID) | ORAL | 0 refills | Status: AC | PRN

## 2015-12-28 MED ORDER — OXYCODONE-ACETAMINOPHEN 5-325 MG PO TABS
1.0000 | ORAL_TABLET | Freq: Once | ORAL | Status: AC
  Administered 2015-12-28: 1 via ORAL
  Filled 2015-12-28: qty 1

## 2015-12-28 NOTE — ED Notes (Signed)
Patient brought back to lobby from xray

## 2015-12-28 NOTE — ED Notes (Signed)
Patient called from lobby to go back to treatment room, but no answer from lobby

## 2015-12-28 NOTE — ED Notes (Signed)
Pt named called twice, no answer

## 2015-12-28 NOTE — ED Triage Notes (Addendum)
Pt here for fractures to right foot. Pt's daughter states [facility] called her and report the patient fell this morning around 7AM, they then performed x-ray of right foot  which showed multiple acute fractures. Pt had ankle fracture ~1 year ago, which x-rays today show has healed.

## 2015-12-28 NOTE — ED Notes (Addendum)
PT DISCHARGED. INSTRUCTIONS AND PRESCRIPTION GIVEN TO HER DAUGHTER. AAOX4. PT IN NO APPARENT DISTRESS. THE OPPORTUNITY TO ASK QUESTIONS WAS PROVIDED.

## 2015-12-28 NOTE — ED Notes (Signed)
ORTHO TECH AWARE OF SPLINT ORDER.

## 2015-12-28 NOTE — ED Provider Notes (Signed)
[facility] DEPT Provider Note   CSN: [id number] Arrival date & time: 12/28/15  1505  First Provider Contact:  First MD Initiated Contact with Patient 12/28/15 1743        History   Chief Complaint Chief Complaint  Patient presents with  . Fall  . Foot Injury    HPI Natalie Rodgers is a 75 y.o. female.  Patient was sent over from her skilled nursing facility for a foot injury. Patient states that she tripped on something on the floor while she was walking to the bathroom earlier this morning and hurt her right foot. She denies any other injuries. She states she did not hit her head. She denies any neck or back pain. History of an ankle fracture that was surgically fixed about a year ago. She had x-rays done at the facility which showed fractures and was sent over here for treatment. Daughter who is with her cannot remember the orthopedist that they previously saw for her ankle fracture.      Past Medical History:  Diagnosis Date  . Abdominal mass 02-16-12   02-20-12 surgery planned -Dr. Alycia Rossetti  . Dementia since 2010  . Diabetes mellitus 02-16-12   oral meds only  . GERD (gastroesophageal reflux disease) 02-16-12   tx. omeprazole  . Hypertension   . Stroke [facility]) 02-16-12   remains with rt. side weakness-uses right hand    Patient Active Problem List   Diagnosis Date Noted  . UTI (lower urinary tract infection) 02/24/2012  . Adynamic ileus ([place]) 02/24/2012  . Shortness of breath 02/24/2012  . Diabetes mellitus ([place]) 02/24/2012  . Hypertension 02/24/2012  . Paroxysmal supraventricular tachycardia ([place]) 02/24/2012  . PAC (premature atrial contraction) 02/24/2012  . Volume excess 02/24/2012  . Abdominal mass, RLQ (right lower quadrant) 01/02/2012    Past Surgical History:  Procedure Laterality Date  . ABDOMINAL HYSTERECTOMY  02/20/2012   Procedure: HYSTERECTOMY ABDOMINAL;  Surgeon: Imagene Gurney A. Alycia Rossetti, MD;  Location: [facility] ORS;  Service: Gynecology;  Laterality: N/A;  .  LAPAROTOMY  02/20/2012   Procedure: EXPLORATORY LAPAROTOMY;  Surgeon: Imagene Gurney A. Alycia Rossetti, MD;  Location: [facility] ORS;  Service: Gynecology;  Laterality: N/A;  . SALPINGOOPHORECTOMY  02/20/2012   Procedure: SALPINGO OOPHERECTOMY;  Surgeon: Imagene Gurney A. Alycia Rossetti, MD;  Location: [facility] ORS;  Service: Gynecology;  Laterality: Bilateral;    OB History    No data available       Home Medications    Prior to Admission medications   Medication Sig Start Date End Date Taking? Authorizing Provider  acetaminophen (TYLENOL) 500 MG tablet Take 500 mg by mouth every 6 (six) hours as needed for mild pain.   Yes Historical Provider, MD  albuterol (PROVENTIL HFA;VENTOLIN HFA) 108 (90 BASE) MCG/ACT inhaler Inhale 1-2 puffs into the lungs every 6 (six) hours as needed for wheezing. 03/01/12  Yes Melissa D Cross, NP  amLODipine-benazepril (LOTREL) 5-20 MG per capsule Take 1 capsule by mouth every morning.    Yes Historical Provider, MD  aspirin 81 MG chewable tablet Chew 81 mg by mouth every morning.   Yes Historical Provider, MD  carvedilol (COREG) 25 MG tablet Take 25 mg by mouth 2 (two) times daily with a meal.   Yes Historical Provider, MD  Cholecalciferol (VITAMIN D3) 1000 UNITS CAPS Take 1,000 Units by mouth every morning.    Yes Historical Provider, MD  Cranberry 405 MG CAPS Take 1 capsule by mouth 2 (two) times daily.   Yes Historical Provider, MD  donepezil (ARICEPT)  10 MG tablet Take 10 mg by mouth at bedtime.    Yes Historical Provider, MD  ezetimibe (ZETIA) 10 MG tablet Take 10 mg by mouth daily.   Yes Historical Provider, MD  fenofibrate 160 MG tablet Take 160 mg by mouth daily.   Yes Historical Provider, MD  guaifenesin (ROBITUSSIN) 100 MG/5ML syrup Take 100 mg by mouth every 6 (six) hours as needed for cough or congestion.   Yes Historical Provider, MD  hydrochlorothiazide (HYDRODIURIL) 25 MG tablet Take 25 mg by mouth daily.    Yes Historical Provider, MD  Melatonin 1 MG TABS Take 2 mg by mouth at bedtime.    Yes  Historical Provider, MD  metFORMIN (GLUCOPHAGE) 1000 MG tablet Take 1,000 mg by mouth 2 (two) times daily with a meal.   Yes Historical Provider, MD  metoprolol succinate (TOPROL-XL) 25 MG 24 hr tablet Take 25 mg by mouth daily.   Yes Historical Provider, MD  pravastatin (PRAVACHOL) 80 MG tablet Take 80 mg by mouth daily.   Yes Historical Provider, MD  ranitidine (ZANTAC) 150 MG tablet Take 150 mg by mouth at bedtime.   Yes Historical Provider, MD  carvedilol (COREG) 12.5 MG tablet Take 12.5 mg by mouth 2 (two) times daily with a meal.    Historical Provider, MD  ciprofloxacin (CIPRO) 500 MG tablet Take 1 tablet (500 mg total) by mouth 2 (two) times daily. Patient not taking: Reported on 12/28/2015 03/01/12   Dorothyann Gibbs, NP  oxyCODONE-acetaminophen (PERCOCET/ROXICET) 5-325 MG per tablet Take 1-2 tablets by mouth every 6 (six) hours as needed for pain. Patient not taking: Reported on 12/28/2015 03/01/12   Dorothyann Gibbs, NP  traMADol (ULTRAM) 50 MG tablet Take 1 tablet (50 mg total) by mouth every 6 (six) hours as needed. 12/28/15   Malvin Johns, MD    Family History Family History  Problem Relation Age of Onset  . Heart disease      No family history per patient    Social History Social History  Substance Use Topics  . Smoking status: Never Smoker  . Smokeless tobacco: Never Used  . Alcohol use No     Allergies   Review of patient's allergies indicates no known allergies.   Review of Systems Review of Systems  Constitutional: Negative for chills, diaphoresis, fatigue and fever.  HENT: Negative for congestion, rhinorrhea and sneezing.   Eyes: Negative.   Respiratory: Negative for cough, chest tightness and shortness of breath.   Cardiovascular: Negative for chest pain and leg swelling.  Gastrointestinal: Negative for abdominal pain, blood in stool, diarrhea, nausea and vomiting.  Genitourinary: Negative for difficulty urinating, flank pain, frequency and hematuria.    Musculoskeletal: Positive for arthralgias. Negative for back pain.  Skin: Negative for rash.  Neurological: Negative for dizziness, speech difficulty, weakness, numbness and headaches.     Physical Exam Updated Vital Signs BP 149/65 (BP Location: Left Arm)   Pulse 83   Temp 98.7 F (37.1 C) (Oral)   Resp 18   SpO2 94%   Physical Exam  Constitutional: She is oriented to person, place, and time. She appears well-developed and well-nourished.  HENT:  Head: Normocephalic and atraumatic.  Eyes: Pupils are equal, round, and reactive to light.  Neck: Normal range of motion. Neck supple.  Cardiovascular: Normal rate, regular rhythm and normal heart sounds.   Pulmonary/Chest: Effort normal and breath sounds normal. No respiratory distress. She has no wheezes. She has no rales. She exhibits no tenderness.  Abdominal: Soft. Bowel sounds are normal. There is no tenderness. There is no rebound and no guarding.  Musculoskeletal: Normal range of motion. She exhibits no edema.  Patient has ecchymosis and mild swelling to the dorsum of the right foot. There is tenderness diffusely along the dorsum of the foot. There is minimal pain to the ankle. No pain to the knee or hip. She has normal sensation and motor function in the foot. Pedal pulses are intact. There is no open wounds.  Lymphadenopathy:    She has no cervical adenopathy.  Neurological: She is alert and oriented to person, place, and time.  Skin: Skin is warm and dry. No rash noted.  Psychiatric: She has a normal mood and affect.     ED Treatments / Results  Labs (all labs ordered are listed, but only abnormal results are displayed) Labs Reviewed - No data to display  EKG  EKG Interpretation None       Radiology Dg Ankle Complete Right  Result Date: 12/28/2015 CLINICAL DATA:  Right foot fractures after fall this morning. Initial encounter. EXAM: RIGHT ANKLE - COMPLETE 3+ VIEW COMPARISON:  None. FINDINGS: No acute fracture or  malalignment at the ankle. Previous bimalleolar ankle fracture repair. No evidence of hardware failure. Metatarsal fractures as described on dedicated foot radiography. IMPRESSION: 1. Negative postoperative right ankle. 2. Second through fourth metatarsal fractures as described on foot exam. Electronically Signed   By: Monte Fantasia M.D.   On: 12/28/2015 17:26   Dg Foot Complete Right  Result Date: 12/28/2015 CLINICAL DATA:  Right foot fractures after fall this morning. Initial encounter. EXAM: RIGHT FOOT COMPLETE - 3+ VIEW COMPARISON:  None. FINDINGS: Second, third, and fourth metatarsal neck fractures with lateral impaction. Lateral displacement up to 50% at the third metatarsal fracture. No dislocation. Nondisplaced third and fifth proximal phalanx base fractures. Ossicle overlaps the medial navicular. Osteopenia.  Ankle findings discussed separately. IMPRESSION: 1. Displaced second through fourth metatarsal neck fractures. 2. Nondisplaced third and fifth proximal phalanx fractures. Electronically Signed   By: Monte Fantasia M.D.   On: 12/28/2015 17:25    Procedures Procedures (including critical care time)  Medications Ordered in ED Medications  oxyCODONE-acetaminophen (PERCOCET/ROXICET) 5-325 MG per tablet 1 tablet (not administered)     Initial Impression / Assessment and Plan / ED Course  I have reviewed the triage vital signs and the nursing notes.  Pertinent labs & imaging results that were available during my care of the patient were reviewed by me and considered in my medical decision making (see chart for details).  Clinical Course    Patient was placed in a posterior splint. She was advised to be nonweightbearing. She states she has a wheelchair to use at the nursing facility. She and her daughter were advised to follow-up with an orthopedist within the next few days or return here as needed for any worsening symptoms.  Final Clinical Impressions(s) / ED Diagnoses   Final  diagnoses:  Multiple closed fractures of right foot, initial encounter    New Prescriptions New Prescriptions   TRAMADOL (ULTRAM) 50 MG TABLET    Take 1 tablet (50 mg total) by mouth every 6 (six) hours as needed.     Malvin Johns, MD 12/28/15 [id number]

## 2018-12-24 ENCOUNTER — Ambulatory Visit: Payer: Self-pay | Admitting: Cardiology

## 2019-08-26 ENCOUNTER — Inpatient Hospital Stay (HOSPITAL_COMMUNITY): Payer: Medicare Other

## 2019-08-26 ENCOUNTER — Emergency Department (HOSPITAL_COMMUNITY): Payer: Medicare Other

## 2019-08-26 ENCOUNTER — Encounter (HOSPITAL_COMMUNITY): Payer: Self-pay | Admitting: Emergency Medicine

## 2019-08-26 ENCOUNTER — Inpatient Hospital Stay (HOSPITAL_COMMUNITY)
Admission: EM | Admit: 2019-08-26 | Discharge: 2019-09-27 | DRG: 871 | Disposition: E | Payer: Medicare Other | Source: Skilled Nursing Facility | Attending: Internal Medicine | Admitting: Internal Medicine

## 2019-08-26 DIAGNOSIS — R739 Hyperglycemia, unspecified: Secondary | ICD-10-CM | POA: Diagnosis present

## 2019-08-26 DIAGNOSIS — I700 Atherosclerosis of aorta: Secondary | ICD-10-CM | POA: Diagnosis present

## 2019-08-26 DIAGNOSIS — E872 Acidosis, unspecified: Secondary | ICD-10-CM | POA: Diagnosis present

## 2019-08-26 DIAGNOSIS — N184 Chronic kidney disease, stage 4 (severe): Secondary | ICD-10-CM | POA: Diagnosis present

## 2019-08-26 DIAGNOSIS — Z7984 Long term (current) use of oral hypoglycemic drugs: Secondary | ICD-10-CM

## 2019-08-26 DIAGNOSIS — M79673 Pain in unspecified foot: Secondary | ICD-10-CM

## 2019-08-26 DIAGNOSIS — I469 Cardiac arrest, cause unspecified: Secondary | ICD-10-CM | POA: Diagnosis not present

## 2019-08-26 DIAGNOSIS — I639 Cerebral infarction, unspecified: Secondary | ICD-10-CM | POA: Diagnosis present

## 2019-08-26 DIAGNOSIS — R062 Wheezing: Secondary | ICD-10-CM | POA: Diagnosis present

## 2019-08-26 DIAGNOSIS — I480 Paroxysmal atrial fibrillation: Secondary | ICD-10-CM | POA: Diagnosis present

## 2019-08-26 DIAGNOSIS — L03115 Cellulitis of right lower limb: Secondary | ICD-10-CM | POA: Diagnosis present

## 2019-08-26 DIAGNOSIS — M79671 Pain in right foot: Secondary | ICD-10-CM | POA: Diagnosis not present

## 2019-08-26 DIAGNOSIS — I129 Hypertensive chronic kidney disease with stage 1 through stage 4 chronic kidney disease, or unspecified chronic kidney disease: Secondary | ICD-10-CM | POA: Diagnosis present

## 2019-08-26 DIAGNOSIS — R0682 Tachypnea, not elsewhere classified: Secondary | ICD-10-CM

## 2019-08-26 DIAGNOSIS — T782XXA Anaphylactic shock, unspecified, initial encounter: Secondary | ICD-10-CM | POA: Diagnosis not present

## 2019-08-26 DIAGNOSIS — E1169 Type 2 diabetes mellitus with other specified complication: Secondary | ICD-10-CM | POA: Diagnosis present

## 2019-08-26 DIAGNOSIS — G8191 Hemiplegia, unspecified affecting right dominant side: Secondary | ICD-10-CM

## 2019-08-26 DIAGNOSIS — D62 Acute posthemorrhagic anemia: Secondary | ICD-10-CM | POA: Diagnosis not present

## 2019-08-26 DIAGNOSIS — K922 Gastrointestinal hemorrhage, unspecified: Secondary | ICD-10-CM | POA: Diagnosis not present

## 2019-08-26 DIAGNOSIS — J9601 Acute respiratory failure with hypoxia: Secondary | ICD-10-CM | POA: Diagnosis not present

## 2019-08-26 DIAGNOSIS — L03116 Cellulitis of left lower limb: Secondary | ICD-10-CM | POA: Diagnosis present

## 2019-08-26 DIAGNOSIS — Z8719 Personal history of other diseases of the digestive system: Secondary | ICD-10-CM

## 2019-08-26 DIAGNOSIS — I517 Cardiomegaly: Secondary | ICD-10-CM | POA: Diagnosis present

## 2019-08-26 DIAGNOSIS — I10 Essential (primary) hypertension: Secondary | ICD-10-CM | POA: Diagnosis not present

## 2019-08-26 DIAGNOSIS — D638 Anemia in other chronic diseases classified elsewhere: Secondary | ICD-10-CM | POA: Diagnosis present

## 2019-08-26 DIAGNOSIS — E1122 Type 2 diabetes mellitus with diabetic chronic kidney disease: Secondary | ICD-10-CM | POA: Diagnosis present

## 2019-08-26 DIAGNOSIS — E785 Hyperlipidemia, unspecified: Secondary | ICD-10-CM | POA: Diagnosis not present

## 2019-08-26 DIAGNOSIS — R4781 Slurred speech: Secondary | ICD-10-CM | POA: Diagnosis not present

## 2019-08-26 DIAGNOSIS — I69251 Hemiplegia and hemiparesis following other nontraumatic intracranial hemorrhage affecting right dominant side: Secondary | ICD-10-CM

## 2019-08-26 DIAGNOSIS — M609 Myositis, unspecified: Secondary | ICD-10-CM | POA: Diagnosis present

## 2019-08-26 DIAGNOSIS — Z90710 Acquired absence of both cervix and uterus: Secondary | ICD-10-CM | POA: Diagnosis not present

## 2019-08-26 DIAGNOSIS — E11628 Type 2 diabetes mellitus with other skin complications: Secondary | ICD-10-CM | POA: Diagnosis present

## 2019-08-26 DIAGNOSIS — I4891 Unspecified atrial fibrillation: Secondary | ICD-10-CM

## 2019-08-26 DIAGNOSIS — R52 Pain, unspecified: Secondary | ICD-10-CM

## 2019-08-26 DIAGNOSIS — K2101 Gastro-esophageal reflux disease with esophagitis, with bleeding: Secondary | ICD-10-CM | POA: Diagnosis not present

## 2019-08-26 DIAGNOSIS — R1311 Dysphagia, oral phase: Secondary | ICD-10-CM | POA: Diagnosis present

## 2019-08-26 DIAGNOSIS — K920 Hematemesis: Secondary | ICD-10-CM | POA: Diagnosis not present

## 2019-08-26 DIAGNOSIS — I679 Cerebrovascular disease, unspecified: Secondary | ICD-10-CM | POA: Diagnosis present

## 2019-08-26 DIAGNOSIS — M79672 Pain in left foot: Secondary | ICD-10-CM | POA: Diagnosis present

## 2019-08-26 DIAGNOSIS — A419 Sepsis, unspecified organism: Secondary | ICD-10-CM | POA: Diagnosis present

## 2019-08-26 DIAGNOSIS — I519 Heart disease, unspecified: Secondary | ICD-10-CM | POA: Diagnosis present

## 2019-08-26 DIAGNOSIS — T17908A Unspecified foreign body in respiratory tract, part unspecified causing other injury, initial encounter: Secondary | ICD-10-CM | POA: Diagnosis not present

## 2019-08-26 DIAGNOSIS — Z79899 Other long term (current) drug therapy: Secondary | ICD-10-CM | POA: Diagnosis not present

## 2019-08-26 DIAGNOSIS — Z7982 Long term (current) use of aspirin: Secondary | ICD-10-CM

## 2019-08-26 DIAGNOSIS — K264 Chronic or unspecified duodenal ulcer with hemorrhage: Secondary | ICD-10-CM | POA: Diagnosis present

## 2019-08-26 DIAGNOSIS — T886XXA Anaphylactic reaction due to adverse effect of correct drug or medicament properly administered, initial encounter: Secondary | ICD-10-CM | POA: Diagnosis not present

## 2019-08-26 DIAGNOSIS — F0390 Unspecified dementia without behavioral disturbance: Secondary | ICD-10-CM | POA: Diagnosis present

## 2019-08-26 DIAGNOSIS — T508X5A Adverse effect of diagnostic agents, initial encounter: Secondary | ICD-10-CM | POA: Diagnosis not present

## 2019-08-26 DIAGNOSIS — R579 Shock, unspecified: Secondary | ICD-10-CM | POA: Diagnosis not present

## 2019-08-26 DIAGNOSIS — Z593 Problems related to living in residential institution: Secondary | ICD-10-CM

## 2019-08-26 DIAGNOSIS — Z20822 Contact with and (suspected) exposure to covid-19: Secondary | ICD-10-CM | POA: Diagnosis present

## 2019-08-26 DIAGNOSIS — D72829 Elevated white blood cell count, unspecified: Secondary | ICD-10-CM | POA: Diagnosis present

## 2019-08-26 DIAGNOSIS — R061 Stridor: Secondary | ICD-10-CM | POA: Diagnosis not present

## 2019-08-26 DIAGNOSIS — N179 Acute kidney failure, unspecified: Secondary | ICD-10-CM | POA: Diagnosis not present

## 2019-08-26 DIAGNOSIS — T380X5A Adverse effect of glucocorticoids and synthetic analogues, initial encounter: Secondary | ICD-10-CM | POA: Diagnosis present

## 2019-08-26 DIAGNOSIS — R9431 Abnormal electrocardiogram [ECG] [EKG]: Secondary | ICD-10-CM | POA: Diagnosis present

## 2019-08-26 DIAGNOSIS — R634 Abnormal weight loss: Secondary | ICD-10-CM | POA: Diagnosis present

## 2019-08-26 DIAGNOSIS — E1165 Type 2 diabetes mellitus with hyperglycemia: Secondary | ICD-10-CM | POA: Diagnosis present

## 2019-08-26 DIAGNOSIS — I5189 Other ill-defined heart diseases: Secondary | ICD-10-CM | POA: Diagnosis present

## 2019-08-26 DIAGNOSIS — I959 Hypotension, unspecified: Secondary | ICD-10-CM | POA: Diagnosis not present

## 2019-08-26 DIAGNOSIS — I6523 Occlusion and stenosis of bilateral carotid arteries: Secondary | ICD-10-CM | POA: Diagnosis present

## 2019-08-26 DIAGNOSIS — R6510 Systemic inflammatory response syndrome (SIRS) of non-infectious origin without acute organ dysfunction: Secondary | ICD-10-CM | POA: Diagnosis not present

## 2019-08-26 DIAGNOSIS — Z8673 Personal history of transient ischemic attack (TIA), and cerebral infarction without residual deficits: Secondary | ICD-10-CM

## 2019-08-26 DIAGNOSIS — R6520 Severe sepsis without septic shock: Secondary | ICD-10-CM | POA: Diagnosis present

## 2019-08-26 DIAGNOSIS — R809 Proteinuria, unspecified: Secondary | ICD-10-CM | POA: Diagnosis present

## 2019-08-26 HISTORY — DX: Unspecified atrial fibrillation: I48.91

## 2019-08-26 HISTORY — DX: Type 2 diabetes mellitus without complications: E11.9

## 2019-08-26 HISTORY — DX: Chronic kidney disease, unspecified: N18.9

## 2019-08-26 HISTORY — DX: Hyperlipidemia, unspecified: E78.5

## 2019-08-26 LAB — CBC WITH DIFFERENTIAL/PLATELET
Abs Immature Granulocytes: 0.09 10*3/uL — ABNORMAL HIGH (ref 0.00–0.07)
Basophils Absolute: 0 10*3/uL (ref 0.0–0.1)
Basophils Relative: 0 %
Eosinophils Absolute: 0 10*3/uL (ref 0.0–0.5)
Eosinophils Relative: 0 %
HCT: 40 % (ref 36.0–46.0)
Hemoglobin: 12.5 g/dL (ref 12.0–15.0)
Immature Granulocytes: 1 %
Lymphocytes Relative: 5 %
Lymphs Abs: 0.7 10*3/uL (ref 0.7–4.0)
MCH: 29.8 pg (ref 26.0–34.0)
MCHC: 31.3 g/dL (ref 30.0–36.0)
MCV: 95.2 fL (ref 80.0–100.0)
Monocytes Absolute: 1.1 10*3/uL — ABNORMAL HIGH (ref 0.1–1.0)
Monocytes Relative: 7 %
Neutro Abs: 13 10*3/uL — ABNORMAL HIGH (ref 1.7–7.7)
Neutrophils Relative %: 87 %
Platelets: 300 10*3/uL (ref 150–400)
RBC: 4.2 MIL/uL (ref 3.87–5.11)
RDW: 14.1 % (ref 11.5–15.5)
WBC: 14.9 10*3/uL — ABNORMAL HIGH (ref 4.0–10.5)
nRBC: 0 % (ref 0.0–0.2)

## 2019-08-26 LAB — URINALYSIS, ROUTINE W REFLEX MICROSCOPIC
Bacteria, UA: NONE SEEN
Bilirubin Urine: NEGATIVE
Glucose, UA: 50 mg/dL — AB
Hgb urine dipstick: NEGATIVE
Ketones, ur: NEGATIVE mg/dL
Leukocytes,Ua: NEGATIVE
Nitrite: NEGATIVE
Protein, ur: 100 mg/dL — AB
Specific Gravity, Urine: 1.016 (ref 1.005–1.030)
pH: 5 (ref 5.0–8.0)

## 2019-08-26 LAB — COMPREHENSIVE METABOLIC PANEL
ALT: 42 U/L (ref 0–44)
AST: 51 U/L — ABNORMAL HIGH (ref 15–41)
Albumin: 4.7 g/dL (ref 3.5–5.0)
Alkaline Phosphatase: 60 U/L (ref 38–126)
Anion gap: 14 (ref 5–15)
BUN: 36 mg/dL — ABNORMAL HIGH (ref 8–23)
CO2: 23 mmol/L (ref 22–32)
Calcium: 9.3 mg/dL (ref 8.9–10.3)
Chloride: 105 mmol/L (ref 98–111)
Creatinine, Ser: 2.02 mg/dL — ABNORMAL HIGH (ref 0.44–1.00)
GFR calc Af Amer: 26 mL/min — ABNORMAL LOW (ref >60)
GFR calc non Af Amer: 23 mL/min — ABNORMAL LOW (ref >60)
Glucose, Bld: 186 mg/dL — ABNORMAL HIGH (ref 70–99)
Potassium: 4.8 mmol/L (ref 3.5–5.1)
Sodium: 142 mmol/L (ref 135–145)
Total Bilirubin: 0.9 mg/dL (ref 0.3–1.2)
Total Protein: 8.6 g/dL — ABNORMAL HIGH (ref 6.5–8.1)

## 2019-08-26 LAB — LACTIC ACID, PLASMA
Lactic Acid, Venous: 2.4 mmol/L (ref 0.5–1.9)
Lactic Acid, Venous: 2 mmol/L (ref 0.5–1.9)

## 2019-08-26 LAB — APTT: aPTT: 24 seconds (ref 24–36)

## 2019-08-26 LAB — PROTIME-INR
INR: 0.9 (ref 0.8–1.2)
Prothrombin Time: 12.5 seconds (ref 11.4–15.2)

## 2019-08-26 LAB — RESPIRATORY PANEL BY RT PCR (FLU A&B, COVID)
Influenza A by PCR: NEGATIVE
Influenza B by PCR: NEGATIVE
SARS Coronavirus 2 by RT PCR: NEGATIVE

## 2019-08-26 LAB — CBG MONITORING, ED
Glucose-Capillary: 171 mg/dL — ABNORMAL HIGH (ref 70–99)
Glucose-Capillary: 187 mg/dL — ABNORMAL HIGH (ref 70–99)

## 2019-08-26 LAB — HEMOGLOBIN A1C
Hgb A1c MFr Bld: 7 % — ABNORMAL HIGH (ref 4.8–5.6)
Mean Plasma Glucose: 154.2 mg/dL

## 2019-08-26 MED ORDER — SODIUM CHLORIDE 0.9% FLUSH
3.0000 mL | Freq: Two times a day (BID) | INTRAVENOUS | Status: DC
  Administered 2019-08-26 – 2019-09-04 (×13): 3 mL via INTRAVENOUS

## 2019-08-26 MED ORDER — SODIUM CHLORIDE 0.9 % IV BOLUS (SEPSIS)
1000.0000 mL | Freq: Once | INTRAVENOUS | Status: AC
  Administered 2019-08-26: 1000 mL via INTRAVENOUS

## 2019-08-26 MED ORDER — HEPARIN SODIUM (PORCINE) 5000 UNIT/ML IJ SOLN: 5000.0000 [IU] | Freq: Three times a day (TID) | INTRAMUSCULAR | Status: DC

## 2019-08-26 MED ORDER — CLONIDINE HCL 0.1 MG PO TABS
0.1000 mg | ORAL_TABLET | Freq: Two times a day (BID) | ORAL | Status: DC
  Administered 2019-08-26 – 2019-08-28 (×3): 0.1 mg via ORAL
  Filled 2019-08-26 (×4): qty 1

## 2019-08-26 MED ORDER — QUETIAPINE FUMARATE 25 MG PO TABS
50.0000 mg | ORAL_TABLET | Freq: Every day | ORAL | Status: DC
  Administered 2019-08-26 – 2019-09-03 (×8): 50 mg via ORAL
  Filled 2019-08-26 (×2): qty 2
  Filled 2019-08-26: qty 1
  Filled 2019-08-26: qty 2
  Filled 2019-08-26: qty 1
  Filled 2019-08-26 (×3): qty 2
  Filled 2019-08-26: qty 1

## 2019-08-26 MED ORDER — MAGNESIUM HYDROXIDE 400 MG/5ML PO SUSP: 30.0000 mL | Freq: Every day | ORAL | Status: DC | PRN

## 2019-08-26 MED ORDER — SODIUM CHLORIDE 0.9 % IV SOLN: INTRAVENOUS | Status: DC

## 2019-08-26 MED ORDER — INSULIN ASPART 100 UNIT/ML ~~LOC~~ SOLN
0.0000 [IU] | Freq: Three times a day (TID) | SUBCUTANEOUS | Status: DC
  Administered 2019-08-26: 2 [IU] via SUBCUTANEOUS
  Administered 2019-08-27: 1 [IU] via SUBCUTANEOUS
  Administered 2019-08-27 (×2): 2 [IU] via SUBCUTANEOUS
  Administered 2019-08-28: 1 [IU] via SUBCUTANEOUS
  Administered 2019-08-28 – 2019-08-29 (×4): 3 [IU] via SUBCUTANEOUS
  Administered 2019-08-29: 5 [IU] via SUBCUTANEOUS
  Administered 2019-08-30: 3 [IU] via SUBCUTANEOUS
  Administered 2019-08-30: 1 [IU] via SUBCUTANEOUS
  Administered 2019-08-30: 2 [IU] via SUBCUTANEOUS
  Administered 2019-08-31: 3 [IU] via SUBCUTANEOUS
  Administered 2019-08-31: 2 [IU] via SUBCUTANEOUS
  Administered 2019-09-01: 7 [IU] via SUBCUTANEOUS
  Administered 2019-09-01: 2 [IU] via SUBCUTANEOUS
  Administered 2019-09-01: 5 [IU] via SUBCUTANEOUS
  Administered 2019-09-02: 2 [IU] via SUBCUTANEOUS
  Administered 2019-09-02: 7 [IU] via SUBCUTANEOUS
  Administered 2019-09-02 – 2019-09-03 (×3): 2 [IU] via SUBCUTANEOUS
  Administered 2019-09-03 – 2019-09-04 (×2): 3 [IU] via SUBCUTANEOUS
  Administered 2019-09-04: 5 [IU] via SUBCUTANEOUS
  Administered 2019-09-04: 3 [IU] via SUBCUTANEOUS
  Filled 2019-08-26: qty 0.09

## 2019-08-26 MED ORDER — SORBITOL 70 % SOLN
30.0000 mL | Freq: Every day | Status: DC | PRN
  Filled 2019-08-26: qty 30

## 2019-08-26 MED ORDER — CARVEDILOL 12.5 MG PO TABS
25.0000 mg | ORAL_TABLET | Freq: Two times a day (BID) | ORAL | Status: DC
  Administered 2019-08-26 – 2019-08-28 (×2): 25 mg via ORAL
  Filled 2019-08-26: qty 2
  Filled 2019-08-26 (×2): qty 1
  Filled 2019-08-26: qty 2

## 2019-08-26 MED ORDER — ONDANSETRON HCL 4 MG PO TABS: 4.0000 mg | ORAL_TABLET | Freq: Four times a day (QID) | ORAL | Status: DC | PRN

## 2019-08-26 MED ORDER — PRAVASTATIN SODIUM 40 MG PO TABS
80.0000 mg | ORAL_TABLET | Freq: Every day | ORAL | Status: DC
  Administered 2019-08-26 – 2019-09-03 (×8): 80 mg via ORAL
  Filled 2019-08-26 (×3): qty 2
  Filled 2019-08-26: qty 4
  Filled 2019-08-26: qty 2
  Filled 2019-08-26: qty 4
  Filled 2019-08-26 (×2): qty 2
  Filled 2019-08-26 (×2): qty 4

## 2019-08-26 MED ORDER — IPRATROPIUM-ALBUTEROL 0.5-2.5 (3) MG/3ML IN SOLN
3.0000 mL | Freq: Once | RESPIRATORY_TRACT | Status: AC
  Administered 2019-08-26: 3 mL via RESPIRATORY_TRACT
  Filled 2019-08-26: qty 3

## 2019-08-26 MED ORDER — SODIUM CHLORIDE 0.9 % IV SOLN
2.0000 g | Freq: Once | INTRAVENOUS | Status: AC
  Administered 2019-08-26: 2 g via INTRAVENOUS
  Filled 2019-08-26: qty 2

## 2019-08-26 MED ORDER — QUETIAPINE FUMARATE 25 MG PO TABS
25.0000 mg | ORAL_TABLET | Freq: Every day | ORAL | Status: DC
  Administered 2019-08-27 – 2019-09-03 (×8): 25 mg via ORAL
  Filled 2019-08-26 (×9): qty 1

## 2019-08-26 MED ORDER — ACETAMINOPHEN 325 MG PO TABS
650.0000 mg | ORAL_TABLET | Freq: Once | ORAL | Status: AC
  Administered 2019-08-26: 650 mg via ORAL
  Filled 2019-08-26: qty 2

## 2019-08-26 MED ORDER — FLEET ENEMA 7-19 GM/118ML RE ENEM: 1.0000 | ENEMA | Freq: Once | RECTAL | Status: DC | PRN

## 2019-08-26 MED ORDER — ASPIRIN EC 81 MG PO TBEC
81.0000 mg | DELAYED_RELEASE_TABLET | Freq: Every day | ORAL | Status: DC
  Administered 2019-08-27 – 2019-09-03 (×8): 81 mg via ORAL
  Filled 2019-08-26 (×9): qty 1

## 2019-08-26 MED ORDER — DONEPEZIL HCL 10 MG PO TABS
10.0000 mg | ORAL_TABLET | Freq: Every day | ORAL | Status: DC
  Administered 2019-08-26 – 2019-09-03 (×8): 10 mg via ORAL
  Filled 2019-08-26 (×5): qty 1
  Filled 2019-08-26: qty 2
  Filled 2019-08-26 (×3): qty 1

## 2019-08-26 MED ORDER — VANCOMYCIN HCL IN DEXTROSE 1-5 GM/200ML-% IV SOLN
1000.0000 mg | Freq: Once | INTRAVENOUS | Status: AC
  Administered 2019-08-26: 1000 mg via INTRAVENOUS
  Filled 2019-08-26: qty 200

## 2019-08-26 MED ORDER — ACETAMINOPHEN 325 MG PO TABS
650.0000 mg | ORAL_TABLET | Freq: Four times a day (QID) | ORAL | Status: DC | PRN
  Administered 2019-08-27 (×2): 650 mg via ORAL
  Filled 2019-08-26 (×2): qty 2

## 2019-08-26 MED ORDER — ALBUTEROL SULFATE (2.5 MG/3ML) 0.083% IN NEBU
2.5000 mg | INHALATION_SOLUTION | Freq: Once | RESPIRATORY_TRACT | Status: AC
  Administered 2019-08-26: 2.5 mg via RESPIRATORY_TRACT
  Filled 2019-08-26: qty 3

## 2019-08-26 MED ORDER — ONDANSETRON HCL 4 MG/2ML IJ SOLN
4.0000 mg | Freq: Four times a day (QID) | INTRAMUSCULAR | Status: DC | PRN
  Administered 2019-09-04: 4 mg via INTRAVENOUS
  Filled 2019-08-26: qty 2

## 2019-08-26 MED ORDER — DILTIAZEM HCL-DEXTROSE 125-5 MG/125ML-% IV SOLN (PREMIX)
5.0000 mg/h | INTRAVENOUS | Status: DC
  Administered 2019-08-26: 15 mg/h via INTRAVENOUS
  Administered 2019-08-26: 5 mg/h via INTRAVENOUS
  Filled 2019-08-26 (×2): qty 125

## 2019-08-26 MED ORDER — ALBUTEROL SULFATE HFA 108 (90 BASE) MCG/ACT IN AERS
2.0000 | INHALATION_SPRAY | Freq: Once | RESPIRATORY_TRACT | Status: AC
  Administered 2019-08-26: 2 via RESPIRATORY_TRACT
  Filled 2019-08-26: qty 6.7

## 2019-08-26 MED ORDER — FENOFIBRATE 160 MG PO TABS
160.0000 mg | ORAL_TABLET | Freq: Every day | ORAL | Status: DC
  Administered 2019-08-27 – 2019-09-03 (×8): 160 mg via ORAL
  Filled 2019-08-26 (×10): qty 1

## 2019-08-26 MED ORDER — ISOSORB DINITRATE-HYDRALAZINE 20-37.5 MG PO TABS
1.0000 | ORAL_TABLET | Freq: Three times a day (TID) | ORAL | Status: DC
  Administered 2019-08-27 – 2019-08-28 (×4): 1 via ORAL
  Filled 2019-08-26 (×5): qty 1

## 2019-08-26 MED ORDER — AMLODIPINE BESY-BENAZEPRIL HCL 10-20 MG PO CAPS: 1.0000 | ORAL_CAPSULE | Freq: Every day | ORAL | Status: DC

## 2019-08-26 MED ORDER — SODIUM CHLORIDE 0.9 % IV BOLUS (SEPSIS)
500.0000 mL | Freq: Once | INTRAVENOUS | Status: AC
  Administered 2019-08-26: 500 mL via INTRAVENOUS

## 2019-08-26 MED ORDER — INSULIN ASPART 100 UNIT/ML ~~LOC~~ SOLN
0.0000 [IU] | Freq: Every day | SUBCUTANEOUS | Status: DC
  Administered 2019-08-28: 4 [IU] via SUBCUTANEOUS
  Administered 2019-08-29: 3 [IU] via SUBCUTANEOUS
  Administered 2019-09-02 – 2019-09-03 (×2): 2 [IU] via SUBCUTANEOUS
  Filled 2019-08-26: qty 0.05

## 2019-08-26 MED ORDER — ACETAMINOPHEN 650 MG RE SUPP
650.0000 mg | Freq: Four times a day (QID) | RECTAL | Status: DC | PRN
  Administered 2019-08-27: 650 mg via RECTAL
  Filled 2019-08-26: qty 1

## 2019-08-26 MED ORDER — EZETIMIBE 10 MG PO TABS
10.0000 mg | ORAL_TABLET | Freq: Every day | ORAL | Status: DC
  Administered 2019-08-27 – 2019-09-03 (×8): 10 mg via ORAL
  Filled 2019-08-26 (×10): qty 1

## 2019-08-26 MED ORDER — SODIUM CHLORIDE 0.9 % IV SOLN
2.0000 g | INTRAVENOUS | Status: DC
  Administered 2019-08-27 – 2019-08-31 (×5): 2 g via INTRAVENOUS
  Filled 2019-08-26 (×7): qty 2

## 2019-08-26 MED ORDER — SENNA 8.6 MG PO TABS
1.0000 | ORAL_TABLET | Freq: Two times a day (BID) | ORAL | Status: DC
  Administered 2019-08-26 – 2019-09-03 (×13): 8.6 mg via ORAL
  Filled 2019-08-26 (×15): qty 1

## 2019-08-26 NOTE — ED Notes (Signed)
XR at bedside

## 2019-08-26 NOTE — ED Triage Notes (Signed)
Per EMS, patient from [facility], c/o leg pain with difficulty ambulating and right side pain to axilla that worsens with movement. Hx dementia.

## 2019-08-26 NOTE — ED Notes (Signed)
Assisted patient with use of bedpan.

## 2019-08-26 NOTE — ED Provider Notes (Signed)
[facility]-EMERGENCY DEPT Provider Note   CSN: [id number] Arrival date & time: 08/27/19  1251     History Chief Complaint  Patient presents with  . Arm Pain  . Leg Pain    Natalie Rodgers is a 79 y.o. female.  HPI     79 year old female, with PMH of dementia, diabetes, hypertension, presents from [place] nursing facility.  Patient states that her feet hurt.  Oriented to person and place.  Per EMS she was complaining of leg pain and right side pain.  Patient also notes shortness of breath.  On arrival patient febrile, tachycardic.  Past Medical History:  Diagnosis Date  . Dementia (HCC)   . Diabetes mellitus without complication (HCC)   . Hypertension     There are no problems to display for this patient.   History reviewed. No pertinent surgical history.   OB History   No obstetric history on file.     No family history on file.  Social History   Tobacco Use  . Smoking status: Not on file  Substance Use Topics  . Alcohol use: Not on file  . Drug use: Not on file    Home Medications Prior to Admission medications   Medication Sig Start Date End Date Taking? Authorizing Provider  amLODipine-benazepril (LOTREL) 10-20 MG capsule Take 1 capsule by mouth daily.   Yes [provider]  aspirin EC 81 MG tablet Take 81 mg by mouth daily.   Yes [provider]  carvedilol (COREG) 25 MG tablet Take 25 mg by mouth 2 (two) times daily with a meal.   Yes [provider]  chlorthalidone (HYGROTON) 25 MG tablet Take 12.5 mg by mouth daily.   Yes [provider]  cloNIDine (CATAPRES) 0.1 MG tablet Take 0.1 mg by mouth 2 (two) times daily.   Yes [provider]  Cranberry 450 MG TABS Take 450 mg by mouth daily.   Yes [provider]  donepezil (ARICEPT) 10 MG tablet Take 10 mg by mouth at bedtime.   Yes [provider]  ezetimibe (ZETIA) 10 MG tablet Take 10 mg by mouth daily.   Yes  [provider]  famotidine (PEPCID) 20 MG tablet Take 20 mg by mouth at bedtime.   Yes [provider]  fenofibrate 160 MG tablet Take 160 mg by mouth daily.   Yes [provider]  glipiZIDE (GLUCOTROL) 10 MG tablet Take 10 mg by mouth daily before breakfast.   Yes [provider]  isosorbide-hydrALAZINE (BIDIL) 20-37.5 MG tablet Take 1 tablet by mouth 3 (three) times daily.   Yes [provider]  melatonin 1 MG TABS tablet Take 2 mg by mouth at bedtime.   Yes [provider]  pravastatin (PRAVACHOL) 80 MG tablet Take 80 mg by mouth at bedtime.   Yes [provider]  QUEtiapine (SEROQUEL) 25 MG tablet Take 25 mg by mouth daily.   Yes [provider]  QUEtiapine (SEROQUEL) 50 MG tablet Take 50 mg by mouth at bedtime.   Yes [provider]  sitaGLIPtin (JANUVIA) 25 MG tablet Take 25 mg by mouth daily.   Yes [provider]  Vitamin D3 (VITAMIN D) 25 MCG tablet Take 2,000 Units by mouth daily.   Yes [provider]    Allergies    Patient has no known allergies.  Review of Systems   Review of Systems  Constitutional: Negative for chills and fever.  Respiratory: Positive for shortness of breath.  Cardiovascular: Negative for chest pain.  Gastrointestinal: Negative for abdominal pain, nausea and vomiting.  Musculoskeletal: Positive for myalgias.    Physical Exam Updated Vital Signs BP (!) 139/91   Pulse 76   Temp (!) 101.1 F (38.4 C) (Oral)   Resp (!) 33   SpO2 99%   Physical Exam Vitals and nursing note reviewed.  Constitutional:      Appearance: She is well-developed.  HENT:     Head: Normocephalic and atraumatic.  Eyes:     Conjunctiva/sclera: Conjunctivae normal.  Cardiovascular:     Rate and Rhythm: Tachycardia present. Rhythm irregular.     Heart sounds: Normal heart sounds. No murmur.  Pulmonary:     Effort: Tachypnea present. No respiratory distress.     Breath  sounds: Wheezing present. No rales.  Abdominal:     General: Bowel sounds are normal. There is no distension.     Palpations: Abdomen is soft.     Tenderness: There is no abdominal tenderness.  Musculoskeletal:        General: No tenderness or deformity. Normal range of motion.     Cervical back: Neck supple.  Skin:    General: Skin is warm and dry.     Findings: No erythema or rash.  Neurological:     Mental Status: She is alert and oriented to person, place, and time.  Psychiatric:        Behavior: Behavior normal.     ED Results / Procedures / Treatments   Labs (all labs ordered are listed, but only abnormal results are displayed) Labs Reviewed  CBC WITH DIFFERENTIAL/PLATELET - Abnormal; Notable for the following components:      Result Value   WBC 14.9 (*)    Neutro Abs 13.0 (*)    Monocytes Absolute 1.1 (*)    Abs Immature Granulocytes 0.09 (*)    All other components within normal limits  COMPREHENSIVE METABOLIC PANEL - Abnormal; Notable for the following components:   Glucose, Bld 186 (*)    BUN 36 (*)    Creatinine, Ser 2.02 (*)    Total Protein 8.6 (*)    AST 51 (*)    GFR calc non Af Amer 23 (*)    GFR calc Af Amer 26 (*)    All other components within normal limits  LACTIC ACID, PLASMA - Abnormal; Notable for the following components:   Lactic Acid, Venous 2.4 (*)    All other components within normal limits  CULTURE, BLOOD (ROUTINE X 2)  CULTURE, BLOOD (ROUTINE X 2)  URINE CULTURE  RESPIRATORY PANEL BY RT PCR (FLU A&B, COVID)  APTT  PROTIME-INR  LACTIC ACID, PLASMA  URINALYSIS, ROUTINE W REFLEX MICROSCOPIC    EKG EKG Interpretation  Date/Time:  Tuesday August 26 2019 13:44:23 EDT Ventricular Rate:  100 PR Interval:    QRS Duration: 80 QT Interval:  346 QTC Calculation: 447 R Axis:   15 Text Interpretation: Atrial flutter/fibrillation Ventricular premature complex Low voltage, precordial leads Confirmed by Lorre Nick ([id number]) on 08/13/2019  2:12:34 PM   Radiology DG Chest Portable 1 View  Result Date: 08/08/2019 CLINICAL DATA:  Fever EXAM: PORTABLE CHEST 1 VIEW COMPARISON:  None. FINDINGS: Lungs are clear. Heart is borderline enlarged with pulmonary vascularity normal. No adenopathy. There is aortic atherosclerosis. No bone lesions. IMPRESSION: Lungs clear. Borderline cardiac enlargement. Aortic Atherosclerosis (ICD10-I70.0). Electronically Signed   By: Bretta Bang III M.D.   On: 08/06/2019 13:48    Procedures .Critical Care Performed by:  Dawanda Mapel S, PA-C Authorized by: Clayborne Artist, PA-C   Critical care provider statement:    Critical care time (minutes):  45   Critical care was necessary to treat or prevent imminent or life-threatening deterioration of the following conditions:  Cardiac failure, circulatory failure, dehydration, shock, sepsis and respiratory failure   Critical care was time spent personally by me on the following activities:  Discussions with consultants, evaluation of patient's response to treatment, examination of patient, ordering and performing treatments and interventions, ordering and review of laboratory studies, ordering and review of radiographic studies, pulse oximetry, re-evaluation of patient's condition, obtaining history from patient or surrogate and review of old charts   (including critical care time)  Medications Ordered in ED Medications  vancomycin (VANCOCIN) IVPB 1000 mg/200 mL premix (has no administration in time range)  ceFEPIme (MAXIPIME) 2 g in sodium chloride 0.9 % 100 mL IVPB (2 g Intravenous New Bag/Given (Non-Interop) 08/14/2019 1440)  diltiazem (CARDIZEM) 125 mg in dextrose 5% 125 mL (1 mg/mL) infusion (5 mg/hr Intravenous New Bag/Given (Non-Interop) 08/06/2019 1427)  sodium chloride 0.9 % bolus 1,000 mL (1,000 mLs Intravenous New Bag/Given 08/23/2019 1431)    And  sodium chloride 0.9 % bolus 1,000 mL (1,000 mLs Intravenous New Bag/Given 08/11/2019 1441)    And    sodium chloride 0.9 % bolus 500 mL (has no administration in time range)  acetaminophen (TYLENOL) tablet 650 mg (650 mg Oral Given 08/08/2019 1414)  albuterol (VENTOLIN HFA) 108 (90 Base) MCG/ACT inhaler 2 puff (2 puffs Inhalation Given 08/06/2019 1433)    ED Course  I have reviewed the triage vital signs and the nursing notes.  Pertinent labs & imaging results that were available during my care of the patient were reviewed by me and considered in my medical decision making (see chart for details).    MDM Rules/Calculators/A&P                       Presents febrile, tachycardic, tachypneic, complaining of bilateral lower extremity pain.  Patient wheezing audibly throughout.  Code sepsis initiated with broad-spectrum antibiotics of vancomycin and cefepime for concern for HCAP.  She was given Tylenol and fluid bolus.  Lactic acid elevated at 2.4.  Overweight count elevated at 14.9.  Creatinine of 2, no recent comparison.  While in the emergency room patient's heart rate spiked to 170s.  She was noted to be in A. fib with RVR.  She was started on a Cardizem drip.  COVID-19 swab is negative.  Chest x-ray shows no evidence of pneumonia, pneumothorax, pleural effusion.  Urine not suggestive of UTI.  Unclear etiology of patient's sepsis.  Discussed with attending, Dr. Freida Busman who was agreeable with plan.  Discussed with hospitalist who will admit the patient.   Natalie Rodgers was evaluated in Emergency Department on 08/27/2019 for the symptoms described in the history of present illness. She was evaluated in the context of the global COVID-19 pandemic, which necessitated consideration that the patient might be at risk for infection with the SARS-CoV-2 virus that causes COVID-19. Institutional protocols and algorithms that pertain to the evaluation of patients at risk for COVID-19 are in a state of rapid change based on information released by regulatory bodies including the CDC and federal and state  organizations. These policies and algorithms were followed during the patient's care in the ED.     Final Clinical Impression(s) / ED Diagnoses Final diagnoses:  None    Rx / DC Orders  ED Discharge Orders    None       Rueben Bash September 09, 2019 2113    Lorre Nick, MD 08/28/19 1036

## 2019-08-26 NOTE — ED Provider Notes (Signed)
Medical screening examination/treatment/procedure(s) were conducted as a shared visit with non-physician practitioner(s) and myself.  I personally evaluated the patient during the encounter.  EKG Interpretation  Date/Time:  Tuesday August 26 2019 13:44:23 EDT Ventricular Rate:  100 PR Interval:    QRS Duration: 80 QT Interval:  346 QTC Calculation: 447 R Axis:   15 Text Interpretation: Atrial flutter/fibrillation Ventricular premature complex Low voltage, precordial leads Confirmed by Lacretia Leigh ([id number]) on 07/31/2019 2:36:25 PM 79 year old female presents with leg pain and fever.  History is limited due to her dementia.  She is febrile here.  Code sepsis started.  Developed atrial fibrillation.  Started on Cardizem drip.  Empiric antibiotics started as she has had respiratory complaints.  Will admit   Lacretia Leigh, MD 08/06/2019 1414

## 2019-08-26 NOTE — H&P (Signed)
[facility] History and Physical  Natalie Rodgers PPI:[id number] DOB: January 04, 1941 DOA: 08/25/2019 PCP: Welford Roche, NP  Admitted from: [facility] Chief Complaint: Right leg pain  History of Present Illness: Natalie Rodgers is a 79 y.o. female with PMH significant for HTN, DM2 not on insulin, CKD, dementia, chronic anemia who resides at [place]. Patient was brought to the ED today for complaint of leg pain and right-sided pain. Patient has dementia and is not able to give details of her history.  I called and discussed with patient's daughter Ms. Lane who was able to have some details. 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  At baseline, patient has mild dementia.  She has abnormal gait because of right hemiparesis but able to ambulate without assistance. This morning staff from SNF called the daughter stating that patient was complaining of pain in the right leg and she was being sent to the hospital.  She was not reported of any fever or other symptoms at that time.  Daughter states that patient received her first dose of Covid vaccine few weeks ago.  She is not sure if patient might have gotten her second dose recently.  On chart review, I noted that patient follows up with oncologist Dr. Janetta Hora in [facility], last visit was a week ago on 3/23.  As per office document, and that visit she was noted to be 7 pounds down in 4 months.  BUN/creatinine from 3/2 was 48/2.7. Chlorthalidone was reduced to 12.5 mg every other day.  Other medications were continued.  On arrival to the ED, patient was febrile to 101.1 and tachycardic in low 100s. She was noted to have audible wheezing throughout. Work-up showed WBC count elevated to 14.9, lactic acid elevated to 2.4, creatinine of 2. EKG read A. fib at the rate of 100 bpm, QTC 447 ms. COVID-19 swab negative Chest x-ray did not show any  evidence of pneumonia, pneumothorax or pleural effusion. Urinalysis did not suggest UTI.  While in the ED today, patient went into rapid A. fib to 170s.  She was started on Cardizem drip. As part of the sepsis work-up, blood cultures were sent.  Patient was given broad-spectrum antibiotic coverage with IV vancomycin and IV cefepime.  Hospitalist service was called for further evaluation and management.  Review of Systems:  All systems were reviewed and were negative unless otherwise mentioned in the HPI   Past medical history: Past Medical History:  Diagnosis Date  . Dementia ([place])   . Diabetes mellitus without complication ([place])   . Hypertension     Past surgical history: History reviewed. No pertinent surgical history.  Social History:  has no history on file for tobacco, alcohol, and drug.  Allergies:  No Known Allergies  Family history:  No family history on file.  Family history was reviewed.  Not pertinent to current presentation.  Home Meds: Prior to Admission medications   Medication Sig Start Date End Date Taking? Authorizing Provider  amLODipine-benazepril (LOTREL) 10-20 MG capsule Take 1 capsule by mouth daily.   Yes [provider]  aspirin EC 81 MG tablet Take 81 mg by mouth daily.   Yes [provider]  carvedilol (COREG) 25 MG tablet Take 25 mg by mouth 2 (two) times daily with a meal.   Yes [provider]  chlorthalidone (HYGROTON) 25 MG tablet Take 12.5 mg by mouth daily.   Yes [provider]  cloNIDine (CATAPRES) 0.1 MG tablet Take 0.1 mg by mouth 2 (two) times daily.   Yes [provider]  Cranberry 450 MG TABS Take 450 mg by mouth daily.   Yes [provider]  donepezil (ARICEPT) 10 MG tablet Take 10 mg by mouth at bedtime.   Yes [provider]  ezetimibe (ZETIA) 10 MG tablet Take 10 mg by mouth daily.   Yes [provider]  famotidine (PEPCID) 20 MG tablet Take 20 mg by mouth at  bedtime.   Yes [provider]  fenofibrate 160 MG tablet Take 160 mg by mouth daily.   Yes [provider]  glipiZIDE (GLUCOTROL) 10 MG tablet Take 10 mg by mouth daily before breakfast.   Yes [provider]  isosorbide-hydrALAZINE (BIDIL) 20-37.5 MG tablet Take 1 tablet by mouth 3 (three) times daily.   Yes [provider]  melatonin 1 MG TABS tablet Take 2 mg by mouth at bedtime.   Yes [provider]  pravastatin (PRAVACHOL) 80 MG tablet Take 80 mg by mouth at bedtime.   Yes [provider]  QUEtiapine (SEROQUEL) 25 MG tablet Take 25 mg by mouth daily.   Yes [provider]  QUEtiapine (SEROQUEL) 50 MG tablet Take 50 mg by mouth at bedtime.   Yes [provider]  sitaGLIPtin (JANUVIA) 25 MG tablet Take 25 mg by mouth daily.   Yes [provider]  Vitamin D3 (VITAMIN D) 25 MCG tablet Take 2,000 Units by mouth daily.   Yes [provider]    Physical Exam: Vitals:   08/04/2019 1650 08/15/2019 1700 08/17/2019 1715 08/08/2019 1730  BP: (!) 162/88 (!) 153/93 (!) 158/91 (!) 150/81  Pulse: (!) 106 (!) 108 (!) 102 96  Resp: (!) 24 20 (!) 27 18  Temp:      TempSrc:      SpO2: 95% 97% 98% 97%  Weight:       Wt Readings from Last 3 Encounters:  08/15/2019 83.9 kg   There is no height or weight on file to calculate BMI.  General exam: Appears calm and comfortable.  Elderly female.  Not in distress Skin: No rashes, lesions or ulcers. HEENT: Atraumatic, normocephalic, supple neck, no obvious bleeding Lungs: Clear to auscultation bilaterally CVS: Irregular tachycardia to 120s GI/Abd soft, nontender, nondistended, bowel sound present CNS: Alert, awake, knows she is in the hospital.  Not oriented to person or time. Psychiatry: Cheerful Extremities: No pedal edema, no calf tenderness.  She has an area of tenderness on the left forefoot over the second/third metatarsal head.  No evidence of local trauma, swelling or  redness.     Consult Orders  (From admission, onward)         Start     Ordered   08/17/2019 1755  PT eval and treat  Routine     08/15/2019 1754   07/31/2019 1554  Consult to hospitalist  ALL PATIENTS BEING ADMITTED/HAVING PROCEDURES NEED COVID-19 SCREENING  Once    Comments: ALL PATIENTS BEING ADMITTED/HAVING PROCEDURES NEED COVID-19 SCREENING  Provider:  (Not yet assigned)  Question Answer Comment  Place call to: [facility]   Reason for Consult Admit      07/30/2019 1554          Labs on Admission:   CBC: Recent Labs  Lab 08/23/2019 1405  WBC 14.9*  NEUTROABS 13.0*  HGB 12.5  HCT 40.0  MCV 95.2  PLT 834    Basic Metabolic Panel: Recent  Labs  Lab 08/06/2019 1405  NA 142  K 4.8  CL 105  CO2 23  GLUCOSE 186*  BUN 36*  CREATININE 2.02*  CALCIUM 9.3    Liver Function Tests: Recent Labs  Lab 08/17/2019 1405  AST 51*  ALT 42  ALKPHOS 60  BILITOT 0.9  PROT 8.6*  ALBUMIN 4.7   No results for input(s): LIPASE, AMYLASE in the last 168 hours. No results for input(s): AMMONIA in the last 168 hours.  Cardiac Enzymes: No results for input(s): CKTOTAL, CKMB, CKMBINDEX, TROPONINI in the last 168 hours.  BNP (last 3 results) No results for input(s): BNP in the last 8760 hours.  ProBNP (last 3 results) No results for input(s): PROBNP in the last 8760 hours.  CBG: No results for input(s): GLUCAP in the last 168 hours.  Lipase  No results found for: LIPASE   Urinalysis    Component Value Date/Time   COLORURINE YELLOW 08/19/2019 [place] 08/22/2019 1519   LABSPEC 1.016 08/25/2019 1519   PHURINE 5.0 08/09/2019 1519   GLUCOSEU 50 (A) 08/23/2019 1519   HGBUR NEGATIVE 07/29/2019 1519   BILIRUBINUR NEGATIVE 08/27/2019 1519   KETONESUR NEGATIVE 08/10/2019 1519   PROTEINUR 100 (A) 08/03/2019 1519   NITRITE NEGATIVE 08/20/2019 1519   LEUKOCYTESUR NEGATIVE 08/12/2019 1519     Drugs of Abuse  No results found for: LABOPIA, COCAINSCRNUR,  LABBENZ, AMPHETMU, THCU, LABBARB    Radiological Exams on Admission: DG Chest Portable 1 View  Result Date: 08/05/2019 CLINICAL DATA:  Fever EXAM: PORTABLE CHEST 1 VIEW COMPARISON:  None. FINDINGS: Lungs are clear. Heart is borderline enlarged with pulmonary vascularity normal. No adenopathy. There is aortic atherosclerosis. No bone lesions. IMPRESSION: Lungs clear. Borderline cardiac enlargement. Aortic Atherosclerosis (ICD10-I70.0). Electronically Signed   By: Lowella Grip III M.D.   On: 08/04/2019 13:48   ------------------------------------------------------------------------------------------------------ Assessment/Plan: Active Problems:   Sepsis ([place])  SIRS  -no clear source of infection but patient has fever of 101, leukocytosis, tachycardia and lactic acidosis. -Only other symptoms she has is right foot pain but I do not see any evidence of cellulitis or trauma or open wound. -Blood culture sent from the ED.  Started on cefepime IV which we will continue for now. -Maintenance IV hydration to continue tonight.  Right foot pain -Started from this morning. -she has right foot pain but I do not see any evidence of cellulitis or trauma or open wound. -Does not have any pain in the first metatarsophalangeal joint to suspect podagra. -I ordered for an x-ray. -Tylenol for pain.  A. fib with RVR -New onset A. Fib.  In RVR in the ED -Likely related to fever. -Started on Cardizem drip which we will continue. -Home medicines reviewed.  Coreg resumed.  Accelerated hypertension -Blood pressure elevated up to 180s. -Patient is on multiple blood pressure medicines including Coreg, clonidine, chlorthalidone, amlodipine/benazepril, Imdur/hydralazine. -Despite possible sepsis, patient has significantly elevated blood pressure.  I would resume all blood pressure medicine except chlorthalidone. -Continue to monitor blood pressure.  CKD stage IV -On chart review, I noted that patient  follows up with oncologist Dr. Janetta Hora in [facility], last visit was a week ago on 3/23.  As per office document, and that visit she was noted to be 7 pounds down in 4 months.  BUN/creatinine from 3/2 was 48/2.7. Chlorthalidone was reduced to 12.5 mg every other day.  Other medications were continued. -Today's patient presents with creatinine of 2.02, which is at her baseline. -Continue  to monitor.  Diabetes mellitus type 2 -I see Januvia and glipizide in her home list.  Keep oral medicines on hold. -Start on sliding scale insulin with Accu-Cheks.  Obtain A1c.  History of intracranial hemorrhage in 2016 Right hemiparesis/impaired mobility -Avoid anticoagulants for prophylaxis.  PT evaluation ordered.  Mobility: Encourage ambulation.  PT eval Code Status:  Full code.  Confirmed with daughter  DVT prophylaxis:  SCDs.  I would avoid heparin products because of history of intracranial hemorrhage. Antimicrobials:  IV cefepime Fluid: Normal saline at 50 mL/h Diet: Cardiac diet  Family Communication:  Spoke with patient's daughter Ms. Lane on the phone Admitted From: [facility] Anticipated date and disposition: back to SNF in next 2-3 days.  Consultants:  none   ----------------------------------------------------------------------------------------------------------------------------------------------------------- Severity of Illness: The appropriate patient status for this patient is INPATIENT. Inpatient status is judged to be reasonable and necessary in order to provide the required intensity of service to ensure the patient's safety. The patient's presenting symptoms, physical exam findings, and initial radiographic and laboratory data in the context of their chronic comorbidities is felt to place them at high risk for further clinical deterioration. Furthermore, it is not anticipated that the patient will be medically stable for discharge from the hospital within 2 midnights of  admission. The following factors support the patient status of inpatient.   " The patient's presenting symptoms include right leg pain. " The worrisome physical exam findings include fever, tachycardia, A. fib with RVR. " The initial radiographic and laboratory data are worrisome because of lactic acidosis, WBC count. " The chronic co-morbidities include nursing home status.   * I certify that at the point of admission it is my clinical judgment that the patient will require inpatient hospital care spanning beyond 2 midnights from the point of admission due to high intensity of service, high risk for further deterioration and high frequency of surveillance required.*   -----------------------------------------------------------------------------------------------------  Cline Cools, MD [facility] Pager: [phone] (Secure Chat preferred). 07/31/2019

## 2019-08-26 NOTE — ED Notes (Signed)
PA made aware patient has audible wheezing.

## 2019-08-26 NOTE — ED Notes (Signed)
Patient transported to XR. 

## 2019-08-26 NOTE — ED Notes (Signed)
Hospitalist at bedside 

## 2019-08-26 NOTE — ED Notes (Signed)
Patient placed on pure wick °

## 2019-08-26 NOTE — ED Notes (Signed)
Respiratory called for breathing treatment.

## 2019-08-26 NOTE — Progress Notes (Signed)
Pharmacy Note   A consult was received from an ED physician for cefepime and vancomycin per pharmacy dosing.    The patient's profile has been reviewed for ht/wt/allergies/indication/available labs.    A one time order has been placed for cefepime 2 gr IV x1 and vancomycin 1000 mg IV x1 .  Further antibiotics/pharmacy consults should be ordered by admitting physician if indicated.                       Thank you,  Royetta Asal, PharmD, BCPS 08/19/2019 1:52 PM

## 2019-08-26 NOTE — Progress Notes (Signed)
Pharmacy Antibiotic Note  Natalie Rodgers is a 79 y.o. female presented to the ED from [facility] facility on 08/15/2019 with c/o leg pain. In the ED, she was found to be febrile with elevated wbc. Pharmacy has been consulted to dose cefepime for suspected sepsis.  - Tmax 101.1, WBC 14.9 -  scr 2.02 (crcl~29)   Plan: - cefepime 2gm IV q24h - f/u renal function closely and adjust dose if/when appropriate  __________________________  Weight: 185 lb (83.9 kg)  Temp (24hrs), Avg:99.9 F (37.7 C), Min:98.6 F (37 C), Max:101.1 F (38.4 C)  Recent Labs  Lab 08/04/2019 1405  WBC 14.9*  CREATININE 2.02*  LATICACIDVEN 2.4*    CrCl cannot be calculated (Unknown ideal weight.).    No Known Allergies   Thank you for allowing pharmacy to be a part of this patient's care.  Lynelle Doctor 08/13/2019 5:02 PM

## 2019-08-27 ENCOUNTER — Inpatient Hospital Stay (HOSPITAL_COMMUNITY): Payer: Medicare Other

## 2019-08-27 ENCOUNTER — Encounter (HOSPITAL_COMMUNITY): Payer: Self-pay | Admitting: Internal Medicine

## 2019-08-27 DIAGNOSIS — R6510 Systemic inflammatory response syndrome (SIRS) of non-infectious origin without acute organ dysfunction: Secondary | ICD-10-CM

## 2019-08-27 DIAGNOSIS — R52 Pain, unspecified: Secondary | ICD-10-CM | POA: Diagnosis not present

## 2019-08-27 DIAGNOSIS — I10 Essential (primary) hypertension: Secondary | ICD-10-CM | POA: Diagnosis present

## 2019-08-27 DIAGNOSIS — E785 Hyperlipidemia, unspecified: Secondary | ICD-10-CM | POA: Diagnosis present

## 2019-08-27 DIAGNOSIS — F0390 Unspecified dementia without behavioral disturbance: Secondary | ICD-10-CM | POA: Diagnosis present

## 2019-08-27 DIAGNOSIS — I4891 Unspecified atrial fibrillation: Secondary | ICD-10-CM | POA: Diagnosis present

## 2019-08-27 DIAGNOSIS — N179 Acute kidney failure, unspecified: Secondary | ICD-10-CM | POA: Diagnosis not present

## 2019-08-27 DIAGNOSIS — M79671 Pain in right foot: Secondary | ICD-10-CM

## 2019-08-27 DIAGNOSIS — E1169 Type 2 diabetes mellitus with other specified complication: Secondary | ICD-10-CM

## 2019-08-27 DIAGNOSIS — M79672 Pain in left foot: Secondary | ICD-10-CM | POA: Diagnosis present

## 2019-08-27 DIAGNOSIS — A419 Sepsis, unspecified organism: Secondary | ICD-10-CM | POA: Diagnosis not present

## 2019-08-27 LAB — GLUCOSE, CAPILLARY
Glucose-Capillary: 167 mg/dL — ABNORMAL HIGH (ref 70–99)
Glucose-Capillary: 146 mg/dL — ABNORMAL HIGH (ref 70–99)
Glucose-Capillary: 164 mg/dL — ABNORMAL HIGH (ref 70–99)
Glucose-Capillary: 166 mg/dL — ABNORMAL HIGH (ref 70–99)

## 2019-08-27 LAB — CBC
HCT: 34.5 % — ABNORMAL LOW (ref 36.0–46.0)
Hemoglobin: 10.7 g/dL — ABNORMAL LOW (ref 12.0–15.0)
MCH: 30.4 pg (ref 26.0–34.0)
MCHC: 31 g/dL (ref 30.0–36.0)
MCV: 98 fL (ref 80.0–100.0)
Platelets: 223 10*3/uL (ref 150–400)
RBC: 3.52 MIL/uL — ABNORMAL LOW (ref 3.87–5.11)
RDW: 14.2 % (ref 11.5–15.5)
WBC: 13.3 10*3/uL — ABNORMAL HIGH (ref 4.0–10.5)
nRBC: 0 % (ref 0.0–0.2)

## 2019-08-27 LAB — BASIC METABOLIC PANEL
Anion gap: 10 (ref 5–15)
BUN: 30 mg/dL — ABNORMAL HIGH (ref 8–23)
CO2: 19 mmol/L — ABNORMAL LOW (ref 22–32)
Calcium: 7.9 mg/dL — ABNORMAL LOW (ref 8.9–10.3)
Chloride: 110 mmol/L (ref 98–111)
Creatinine, Ser: 1.74 mg/dL — ABNORMAL HIGH (ref 0.44–1.00)
GFR calc Af Amer: 32 mL/min — ABNORMAL LOW (ref >60)
GFR calc non Af Amer: 27 mL/min — ABNORMAL LOW (ref >60)
Glucose, Bld: 188 mg/dL — ABNORMAL HIGH (ref 70–99)
Potassium: 4.1 mmol/L (ref 3.5–5.1)
Sodium: 139 mmol/L (ref 135–145)

## 2019-08-27 LAB — URINE CULTURE: Culture: NO GROWTH

## 2019-08-27 LAB — MRSA PCR SCREENING: MRSA by PCR: NEGATIVE

## 2019-08-27 MED ORDER — BENAZEPRIL HCL 20 MG PO TABS
20.0000 mg | ORAL_TABLET | Freq: Every day | ORAL | Status: DC
  Administered 2019-08-28: 20 mg via ORAL
  Filled 2019-08-27 (×2): qty 1

## 2019-08-27 MED ORDER — CHLORHEXIDINE GLUCONATE CLOTH 2 % EX PADS
6.0000 | MEDICATED_PAD | Freq: Every day | CUTANEOUS | Status: DC
  Administered 2019-08-27 – 2019-09-04 (×9): 6 via TOPICAL

## 2019-08-27 MED ORDER — AMLODIPINE BESYLATE 10 MG PO TABS
10.0000 mg | ORAL_TABLET | Freq: Every day | ORAL | Status: DC
  Administered 2019-08-28: 10 mg via ORAL
  Filled 2019-08-27 (×2): qty 1

## 2019-08-27 NOTE — Progress Notes (Signed)
Returned call to patient's daughter and she did not answer. Left voicemail.

## 2019-08-27 NOTE — Evaluation (Signed)
Clinical/Bedside Swallow Evaluation Patient Details  Name: Natalie Rodgers MRN: [id number] Date of Birth: June 04, 1940  Today's Date: 08/27/2019 Time: SLP Start Time (ACUTE ONLY): 1700 SLP Stop Time (ACUTE ONLY): 1716 SLP Time Calculation (min) (ACUTE ONLY): 16 min  Past Medical History:  Past Medical History:  Diagnosis Date  . Chronic kidney disease    CKD Stage IV  . Dementia ([place])   . Diabetes mellitus without complication ([place])   . GERD (gastroesophageal reflux disease)   . Hypertension    Past Surgical History:  Past Surgical History:  Procedure Laterality Date  . ABDOMINAL HYSTERECTOMY     salpinoophorectomy  . LAPAROTOMY     HPI:  Pt is 79 yo female with PMH including HTN, DM2, CKD, mild dementia, anemia, oncology care, hx of ICH with R hemiparesis 2016. Pt presented to ED with complaint of leg and foot pain.  Imaging of feet - negative for acute fx.  Pt admitted with SIRS (no clear source per note) and afib with RVR.    Swallow evaluation ordered due to pt overtly coughing with water today.  RN reports pt tolerated po medications well.  Pt was tachycardic and febrile upon admission.  Urinalysis was negative as well CXR.  Pt resides at [place].  Per MD note, pt follows some directions and is disoriented to location.  Pt denies difficulty with swallowing.   Assessment / Plan / Recommendation Clinical Impression  Pt would not awaken initially but upon SLP return within 5 minutes, she woke adequately for evaluation.   Pt presents with wheeze noted to increase with intake and snoring respirations at rest.  Multiple swallows noted across all consistencies and throat clearing with water intake. Although no overt coughing noted, SLP intentionally limited bolus amount.  Pt denies dysphagia prior to admission - she is oriented to self but not location. Given her hx of CVA, likely overt aspiration with water earlier in the day and indications of possible oropharyngeal deficits for which  an MBS is indicated.    Advised RN to recommendations for po medications, small amounts of floor stock nectar and tsps water and MBS 08/28/2019.    Signs posted in room with information.  Pt left with Filipino music playing and her nurse tech with her. SLP Visit Diagnosis: Dysphagia, unspecified (R13.10)    Aspiration Risk  Moderate aspiration risk    Diet Recommendation (meds with puree, floor stock nectar and tsps water)   Medication Administration: Whole meds with puree Supervision: Staff to assist with self feeding Compensations: Slow rate;Small sips/bites Postural Changes: Remain upright for at least 30 minutes after po intake;Seated upright at 90 degrees    Other  Recommendations     Follow up Recommendations Other (comment)(tbd)      Frequency and Duration     tbd       Prognosis   tbd     Swallow Study   General Date of Onset: 08/27/19 HPI: Pt is 79 yo female with PMH including HTN, DM2, CKD, mild dementia, anemia, oncology care, hx of ICH with R hemiparesis 2016. Pt presented to ED with complaint of leg and foot pain.  Imaging of feet - negative for acute fx.  Pt admitted with SIRS (no clear source per note) and afib with RVR.    Swallow evaluation ordered due to pt overtly coughing with water today.  RN reports pt tolerated po medications well.  Pt was tachycardic and febrile upon admission.  Urinalysis was negative as well CXR.  Pt  resides at [place].  Per MD note, pt follows some directions and is disoriented to location.  Pt denies difficulty with swallowing. Type of Study: Bedside Swallow Evaluation Diet Prior to this Study: NPO Temperature Spikes Noted: No Respiratory Status: Room air History of Recent Intubation: No Behavior/Cognition: Alert;Requires cueing;Doesn't follow directions(inconsistently follows directions) Oral Cavity Assessment: Within Functional Limits Oral Care Completed by SLP: No Oral Cavity - Dentition: Adequate natural dentition Vision:  Functional for self-feeding Self-Feeding Abilities: Able to feed self Patient Positioning: Upright in bed Baseline Vocal Quality: Other (comment)(voice is strained) Volitional Cough: Weak Volitional Swallow: Unable to elicit    Oral/Motor/Sensory Function Overall Oral Motor/Sensory Function: Other (comment)(pt did not follow directions fully during testing, able to seal lips on spoon and straw, sluggish palatal elevation,  absent gag)   Ice Chips Ice chips: Not tested Other Comments: pt did not consume ice chips stating "it's cold"   Thin Liquid Thin Liquid: Impaired Presentation: Self Fed;Cup;Spoon Pharyngeal  Phase Impairments: Throat Clearing - Delayed;Throat Clearing - Immediate;Multiple swallows    Nectar Thick Nectar Thick Liquid: Impaired Pharyngeal Phase Impairments: Multiple swallows   Honey Thick Honey Thick Liquid: Not tested   Puree Puree: Impaired Pharyngeal Phase Impairments: Multiple swallows   Solid     Solid: Not tested      Natalie Rodgers 08/27/2019,5:57 PM  Natalie Lime, MS [place] Office [phone]

## 2019-08-27 NOTE — Progress Notes (Signed)
Spoke with Benjie Karvonen, patient's daughter on the phone.  Daughter informed RN that patient's primary language is Tagalog (patient from [place]). Daughter reports that patient understands english well but that she is hard to understand at times for the listener when she speaks english.

## 2019-08-27 NOTE — Progress Notes (Addendum)
PROGRESS NOTE    Natalie Rodgers  ZJI:[id number] DOB: 07-31-1940 DOA: 08/17/2019 PCP: Welford Roche, NP   Brief Narrative: Natalie Rodgers is a 79 y.o. female with PMH significant for HTN, DM2 not on insulin, CKD, dementia, chronic anemia. Patient presented secondary to right leg pain. She resides at [facility]. She was found to be febrile on admission with associated evidence concerning for SIRS with unknown source. She was started on empiric antibiotics. She also developed atrial fibrillation with RVR and started on Diltiazem IV.   Assessment & Plan:   Principal Problem:   SIRS (systemic inflammatory response syndrome) (HCC) Active Problems:   Atrial fibrillation with RVR (HCC)   AKI (acute kidney injury) ([facility])   Type 2 diabetes mellitus with hyperlipidemia (HCC)   Essential hypertension   Dementia without behavioral disturbance (HCC)   Bilateral foot pain   SIRS No source. Does not meet sepsis criteria. SIRS criteria met on admission. Urine culture with no growth (final); blood cultures with no growth to date. COVID-19 and influenza negative. Chest x-ray without acute abnormalities. Left foot x-ray with possible evidence of infection. Empirically started on Vancomycin and Cefepime. -Continue Cefepime -Follow-up blood cultures  Atrial fibrillation with RVR Initially started on Diltiazem drip which is now discontinued. Currently in sinus rhythm. On Coreg and aspirin as an outpatient. CHA2DS2-VASc Score is 7. Patient with a history of intracranial hemorrhage. -Continue Coreg  Bilateral foot pain Left foot x-ray significant for possible evidence of infection. Right foot x-ray unremarkable. Significant tenderness of bilateral feet. - Continue Tylenol prn  Leukocytosis Unknown etiology. Trended down slightly. -CBC in AM  Mild right-sided rales Possible aspiration. Clear chest x-ray on admission.  AKI Creatinine of 2.02 on admission. Baseline creatinine on chart review  of 0.8 from 2013. Could not find evidence of CKD. Improving on IV fluids. -Continue IV fluids  Diabetes mellitus, type 2 Hemoglobin A1C of 7%. Patient is on glipizide. -Continue SSI  Essential hypertension -Continue benazepril, amlodipine, clonidine, BiDil  Hyperlipidemia -Continue Zetia, pravastatin, fenofibrate  Dementia -Continue Aricept -SLP evaluation for possible dysphagia   DVT prophylaxis: SCDs Code Status:   Code Status: Full Code Family Communication: Daughter at bedside Disposition Plan: Discharge pending continued SIRS workup   Consultants:   None  Procedures:   None  Antimicrobials:  Vancomycin  Cefepime    Subjective: Foot pain today  Objective: Vitals:   08/27/19 0800 08/27/19 0957 08/27/19 1100 08/27/19 1200  BP:  (!) 110/44 (!) 96/38 (!) 127/52  Pulse:   62 71  Resp:   (!) 24 (!) 21  Temp: 99.4 F (37.4 C)   98.3 F (36.8 C)  TempSrc: Axillary   Axillary  SpO2:   93% 95%  Weight:        Intake/Output Summary (Last 24 hours) at 08/27/2019 1356 Last data filed at 08/27/2019 0700 Gross per 24 hour  Intake 300.43 ml  Output --  Net 300.43 ml   Filed Weights   08/13/2019 1537 08/27/19 0135  Weight: 83.9 kg 67.8 kg    Examination:  General exam: Appears calm and comfortable Respiratory system: Diminished on auscultation with mild right sided rales. Respiratory effort normal. Cardiovascular system: S1 & S2 heard, RRR. No murmurs, rubs, gallops or clicks. Gastrointestinal system: Abdomen is nondistended, soft and nontender. No organomegaly or masses felt. Normal bowel sounds heard. Central nervous system: Alert and oriented to person and place. Right sided hemiplegia Extremities: No edema. No calf tenderness. Significant tenderness on bilateral foot palpation. Skin: No cyanosis.  No rashes Psychiatry: Judgement and insight appear normal. Mood & affect appropriate.     Data Reviewed: I have personally reviewed following labs and  imaging studies  CBC: Recent Labs  Lab 08/17/2019 1405 08/27/19 0256  WBC 14.9* 13.3*  NEUTROABS 13.0*  --   HGB 12.5 10.7*  HCT 40.0 34.5*  MCV 95.2 98.0  PLT 300 528   Basic Metabolic Panel: Recent Labs  Lab 08/09/2019 1405 08/27/19 0256  NA 142 139  K 4.8 4.1  CL 105 110  CO2 23 19*  GLUCOSE 186* 188*  BUN 36* 30*  CREATININE 2.02* 1.74*  CALCIUM 9.3 7.9*   GFR: CrCl cannot be calculated (Unknown ideal weight.). Liver Function Tests: Recent Labs  Lab 08/07/2019 1405  AST 51*  ALT 42  ALKPHOS 60  BILITOT 0.9  PROT 8.6*  ALBUMIN 4.7   No results for input(s): LIPASE, AMYLASE in the last 168 hours. No results for input(s): AMMONIA in the last 168 hours. Coagulation Profile: Recent Labs  Lab 08/07/2019 1405  INR 0.9   Cardiac Enzymes: No results for input(s): CKTOTAL, CKMB, CKMBINDEX, TROPONINI in the last 168 hours. BNP (last 3 results) No results for input(s): PROBNP in the last 8760 hours. HbA1C: Recent Labs    08/09/2019 1405  HGBA1C 7.0*   CBG: Recent Labs  Lab 08/25/2019 1814 08/06/2019 2243 08/27/19 0756 08/27/19 1313  GLUCAP 171* 187* 167* 146*   Lipid Profile: No results for input(s): CHOL, HDL, LDLCALC, TRIG, CHOLHDL, LDLDIRECT in the last 72 hours. Thyroid Function Tests: No results for input(s): TSH, T4TOTAL, FREET4, T3FREE, THYROIDAB in the last 72 hours. Anemia Panel: No results for input(s): VITAMINB12, FOLATE, FERRITIN, TIBC, IRON, RETICCTPCT in the last 72 hours. Sepsis Labs: Recent Labs  Lab 08/07/2019 1405 08/17/2019 1812  LATICACIDVEN 2.4* 2.0*    Recent Results (from the past 240 hour(s))  Blood culture (routine x 2)     Status: None (Preliminary result)   Collection Time: 08/07/2019  2:05 PM   Specimen: BLOOD  Result Value Ref Range Status   Specimen Description   Final    BLOOD LEFT ANTECUBITAL Performed at [facility] [address] [place], [place] [address]    Special Requests   Final    BOTTLES  DRAWN AEROBIC AND ANAEROBIC Blood Culture adequate volume Performed at [facility] [address] [place], [place] [address]    Culture   Final    NO GROWTH < 24 HOURS Performed at [facility] [address] [place], [place] [address]    Report Status PENDING  Incomplete  Respiratory Panel by RT PCR (Flu A&B, Covid) - Nasopharyngeal Swab     Status: None   Collection Time: 08/21/2019  2:18 PM   Specimen: Nasopharyngeal Swab  Result Value Ref Range Status   SARS Coronavirus 2 by RT PCR NEGATIVE NEGATIVE Final    Comment: (NOTE) SARS-CoV-2 target nucleic acids are NOT DETECTED. The SARS-CoV-2 RNA is generally detectable in upper respiratoy specimens during the acute phase of infection. The lowest concentration of SARS-CoV-2 viral copies this assay can detect is 131 copies/mL. A negative result does not preclude SARS-Cov-2 infection and should not be used as the sole basis for treatment or other patient management decisions. A negative result may occur with  improper specimen collection/handling, submission of specimen other than nasopharyngeal swab, presence of viral mutation(s) within the areas targeted by this assay, and inadequate number of viral copies (<131 copies/mL). A negative  result must be combined with clinical observations, patient history, and epidemiological information. The expected result is Negative. Fact Sheet for Patients:  PinkCheek.be Fact Sheet for Healthcare Providers:  GravelBags.it This test is not yet ap proved or cleared by the [place] FDA and  has been authorized for detection and/or diagnosis of SARS-CoV-2 by FDA under an Emergency Use Authorization (EUA). This EUA will remain  in effect (meaning this test can be used) for the duration of the COVID-19 declaration under Section 564(b)(1) of the Act, 21 U.S.C. section 360bbb-3(b)(1), unless the authorization is  terminated or revoked sooner.    Influenza A by PCR NEGATIVE NEGATIVE Final   Influenza B by PCR NEGATIVE NEGATIVE Final    Comment: (NOTE) The Xpert Xpress SARS-CoV-2/FLU/RSV assay is intended as an aid in  the diagnosis of influenza from Nasopharyngeal swab specimens and  should not be used as a sole basis for treatment. Nasal washings and  aspirates are unacceptable for Xpert Xpress SARS-CoV-2/FLU/RSV  testing. Fact Sheet for Patients: PinkCheek.be Fact Sheet for Healthcare Providers: GravelBags.it This test is not yet approved or cleared by the [place] FDA and  has been authorized for detection and/or diagnosis of SARS-CoV-2 by  FDA under an Emergency Use Authorization (EUA). This EUA will remain  in effect (meaning this test can be used) for the duration of the  Covid-19 declaration under Section 564(b)(1) of the Act, 21  U.S.C. section 360bbb-3(b)(1), unless the authorization is  terminated or revoked. Performed at [facility], [place] [address] [place], [place] [address]   Blood culture (routine x 2)     Status: None (Preliminary result)   Collection Time: 07/28/2019  2:37 PM   Specimen: BLOOD  Result Value Ref Range Status   Specimen Description   Final    BLOOD RIGHT WRIST Performed at [facility] [address], [place], [place] [address]    Special Requests   Final    BOTTLES DRAWN AEROBIC ONLY Blood Culture results may not be optimal due to an inadequate volume of blood received in culture bottles Performed at [facility] [address] [place], [place] [address]    Culture   Final    NO GROWTH < 24 HOURS Performed at [facility] [address] [place], [place] [address]    Report Status PENDING  Incomplete  Urine culture     Status: None   Collection Time: 08/25/2019  3:19 PM   Specimen: Urine, Catheterized  Result Value Ref Range  Status   Specimen Description   Final    URINE, CATHETERIZED Performed at [facility] [address], [place], [place] [address]    Special Requests   Final    NONE Performed at [facility], [place] [address] [place], [place] [address]    Culture   Final    NO GROWTH Performed at [facility], [place] [address], [place], [place] [address]    Report Status 08/27/2019 FINAL  Final  MRSA PCR Screening     Status: None   Collection Time: 08/27/19  1:46 AM   Specimen: Nasal Mucosa; Nasopharyngeal  Result Value Ref Range Status   MRSA by PCR NEGATIVE NEGATIVE Final    Comment:        The GeneXpert MRSA Assay (FDA approved for NASAL specimens only), is one component of a comprehensive MRSA colonization surveillance program. It is not intended to diagnose MRSA infection nor  to guide or monitor treatment for MRSA infections. Performed at [facility], [place] [address], [place], [place] [address]          Radiology Studies: DG Chest Portable 1 View  Result Date: 08/15/2019 CLINICAL DATA:  Fever EXAM: PORTABLE CHEST 1 VIEW COMPARISON:  None. FINDINGS: Lungs are clear. Heart is borderline enlarged with pulmonary vascularity normal. No adenopathy. There is aortic atherosclerosis. No bone lesions. IMPRESSION: Lungs clear. Borderline cardiac enlargement. Aortic Atherosclerosis (ICD10-I70.0). Electronically Signed   By: Lowella Grip III M.D.   On: 08/12/2019 13:48   DG Foot 2 Views Left  Result Date: 08/04/2019 CLINICAL DATA:  Left-sided foot pain elevated white count EXAM: LEFT FOOT - 2 VIEW COMPARISON:  None. FINDINGS: Partially visualized fixating screws within the distal tibia and distal fibula. No fracture or malalignment. Questionable lucency and erosive change at the sesamoid bone adjacent to the first metatarsal head. Negative for soft tissue emphysema. Moderate plantar calcaneal spur. IMPRESSION: 1. Possible lucency  and erosive change at the sesamoid bone adjacent to the head of first metatarsal, correlate with physical exam for signs or symptoms of infection in this region. MRI follow-up could be obtained as clinically warranted. Electronically Signed   By: Donavan Foil M.D.   On: 08/07/2019 18:00   DG Foot Complete Right  Result Date: 08/27/2019 CLINICAL DATA:  Nonresponsive.  Foot pain. EXAM: RIGHT FOOT COMPLETE - 3+ VIEW COMPARISON:  02/24/2016 FINDINGS: Interval healing of the third and fourth metatarsal neck fractures. Remote bimalleolar fracture and repair. Hammertoe deformity on the lateral view. No acute fracture or malalignment. IMPRESSION: No acute finding. Electronically Signed   By: Monte Fantasia M.D.   On: 08/27/2019 10:50        Scheduled Meds: . amLODipine  10 mg Oral Daily   And  . benazepril  20 mg Oral Daily  . aspirin EC  81 mg Oral Daily  . carvedilol  25 mg Oral BID WC  . Chlorhexidine Gluconate Cloth  6 each Topical Daily  . cloNIDine  0.1 mg Oral BID  . donepezil  10 mg Oral QHS  . ezetimibe  10 mg Oral Daily  . fenofibrate  160 mg Oral Daily  . insulin aspart  0-5 Units Subcutaneous QHS  . insulin aspart  0-9 Units Subcutaneous TID WC  . isosorbide-hydrALAZINE  1 tablet Oral TID  . pravastatin  80 mg Oral QHS  . QUEtiapine  25 mg Oral Daily  . QUEtiapine  50 mg Oral QHS  . senna  1 tablet Oral BID  . sodium chloride flush  3 mL Intravenous Q12H   Continuous Infusions: . sodium chloride 50 mL/hr at 08/27/19 0700  . ceFEPime (MAXIPIME) IV    . diltiazem (CARDIZEM) infusion 5 mg/hr (08/27/19 0245)     LOS: 1 day     Cordelia Poche, MD [facility] 08/27/2019, 1:56 PM  If 7PM-7AM, please contact night-coverage [email or web address]

## 2019-08-27 NOTE — Progress Notes (Signed)
Called MRI regarding order to scan feet; currently backlogged d/t multiple STAT orders from the ED. MRI tech stated they would notify when pt was able to come down. Night shift RN notified, MD notified.

## 2019-08-27 NOTE — Evaluation (Signed)
Physical Therapy Evaluation Patient Details Name: Natalie Rodgers MRN: [id number] DOB: Aug 14, 1940 Today's Date: 08/27/2019   History of Present Illness  Pt is 79 yo female with PMH including HTN, DM2, CKD, mild dementia, anemia, hx of CVA with R hemiparesis. Pt presented to ED with complaint of leg and foot pain.  Imaging of feet - negative for acute fx.  Pt admitted with SIRS (no clear source per note) and afib with RVR.  Clinical Impression  Pt admitted with above diagnosis. Evaluation was very limited due to pt with severe bil foot pain and unable to tolerate ROM or sitting EOB.  Daughter was present and provided history.  Pt normally able to ambulate at facility without AD and does not typically c/o pain per daughter.   Pt currently with functional limitations due to the deficits listed below (see PT Problem List). Pt will benefit from skilled PT to increase their independence and safety with mobility to allow discharge to the venue listed below.       Follow Up Recommendations SNF    Equipment Recommendations  Other (comment)(TBD - eval limited)    Recommendations for Other Services       Precautions / Restrictions Precautions Precautions: Fall      Mobility  Bed Mobility Overal bed mobility: Needs Assistance             General bed mobility comments: Bed mobility - scooting in bed : pt requring max A, cues to move upper and lower body, severe limitations due to bil foot pain  Transfers                 General transfer comment: Not attempted due to pt unable due to severe foot pain  Ambulation/Gait                Stairs            Wheelchair Mobility    Modified Rankin (Stroke Patients Only)       Balance                                             Pertinent Vitals/Pain Pain Assessment: Faces Faces Pain Scale: Hurts worst Pain Location: Bil feet. Pain Descriptors / Indicators: Grimacing;Moaning Pain  Intervention(s): Limited activity within patient's tolerance;Monitored during session;Patient requesting pain meds-RN notified;Repositioned;Relaxation;Other (comment)(At arrival pt sleeping; pain in feet initiated with attempt to don socks and transfers)   Pt c/o severe foot pain with light touch and any ROM.  Unable to don socks due to pain.  Did note mild edema in bil feet but no erythema.      Home Living Family/patient expects to be discharged to:: Skilled nursing facility([facility])                      Prior Function Level of Independence: Needs assistance   Gait / Transfers Assistance Needed: Walks without AD; could walk to dining hall  ADL's / Homemaking Assistance Needed: Staff assist with bathing and dressing; pt able to do toielting  Comments: Daughter provided history.  She reports pt is "not a complainer, if she is saying she hurts then it is severe"     Hand Dominance        Extremity/Trunk Assessment   Upper Extremity Assessment Upper Extremity Assessment: Generalized weakness    Lower Extremity Assessment Lower Extremity Assessment: RLE  deficits/detail;LLE deficits/detail RLE Deficits / Details: Severe limitations due to pain.  Hip ROM appears WFL and 1/5 strength.  Knees unable to test due to pain in feet.  Ankles very limited motion due to pain. LLE Deficits / Details: Severe limitations due to pain.  Hip ROM appears WFL and 1/5 strength.  Knees unable to test due to pain in feet.  Ankles very limited motion due to pain.       Communication   Communication: (Per daughter: understands English well, speaks English but is difficult to understand;  speaks Tagalog originally)  Cognition Arousal/Alertness: Lethargic Behavior During Therapy: WFL for tasks assessed/performed Overall Cognitive Status: Difficult to assess                                 General Comments: Difficult to assess due to focused on pain.  Additionally, Per daughter:  understands English well and speaks English but she is difficult to understand; speaks Tagalog originally      General Comments General comments (skin integrity, edema, etc.): Daughter with questions regarding infection source and asking if pt on antibiotics and pain meds.  She expressed concern that infection source is in feet as that is the only area of pain- encouraged her to discuss questions with MD. Did review meds and informed daughter pt has been given an antibiotic and acetaminophen.  Dtr request more pain meds - notified RN. Discussed PT role and POC and will follow up at later time.       Exercises     Assessment/Plan    PT Assessment Patient needs continued PT services  PT Problem List Decreased strength;Decreased mobility;Decreased safety awareness;Decreased range of motion;Decreased coordination;Decreased activity tolerance;Decreased balance;Decreased knowledge of use of DME;Pain       PT Treatment Interventions DME instruction;Therapeutic activities;Gait training;Therapeutic exercise;Patient/family education;Balance training;Functional mobility training;Wheelchair mobility training    PT Goals (Current goals can be found in the Care Plan section)  Acute Rehab PT Goals Patient Stated Goal: decrease pain PT Goal Formulation: With patient/family Time For Goal Achievement: 09/10/19 Potential to Achieve Goals: Good    Frequency Min 2X/week   Barriers to discharge        Co-evaluation               AM-PAC PT "6 Clicks" Mobility  Outcome Measure Help needed turning from your back to your side while in a flat bed without using bedrails?: Total Help needed moving from lying on your back to sitting on the side of a flat bed without using bedrails?: Total Help needed moving to and from a bed to a chair (including a wheelchair)?: Total Help needed standing up from a chair using your arms (e.g., wheelchair or bedside chair)?: Total Help needed to walk in hospital  room?: Total Help needed climbing 3-5 steps with a railing? : Total 6 Click Score: 6    End of Session   Activity Tolerance: Patient limited by pain Patient left: in bed;with family/visitor present;with bed alarm set;with call bell/phone within reach Nurse Communication: Mobility status;Patient requests pain meds PT Visit Diagnosis: Pain;Muscle weakness (generalized) (M62.81);Unsteadiness on feet (R26.81) Pain - part of body: (feet)    Time: 2426-8341 PT Time Calculation (min) (ACUTE ONLY): 22 min   Charges:   PT Evaluation $PT Eval Low Complexity: 1 Low          Maggie Font, PT Acute Rehab Services Pager [phone] [facility] [phone] [place]  [facility] [phone]   Karlton Lemon 08/27/2019, 1:48 PM

## 2019-08-28 ENCOUNTER — Other Ambulatory Visit: Payer: Self-pay

## 2019-08-28 ENCOUNTER — Inpatient Hospital Stay (HOSPITAL_COMMUNITY): Payer: Medicare Other

## 2019-08-28 DIAGNOSIS — I639 Cerebral infarction, unspecified: Secondary | ICD-10-CM | POA: Diagnosis not present

## 2019-08-28 DIAGNOSIS — I959 Hypotension, unspecified: Secondary | ICD-10-CM | POA: Diagnosis not present

## 2019-08-28 DIAGNOSIS — E872 Acidosis: Secondary | ICD-10-CM | POA: Diagnosis not present

## 2019-08-28 DIAGNOSIS — J9601 Acute respiratory failure with hypoxia: Secondary | ICD-10-CM | POA: Diagnosis not present

## 2019-08-28 DIAGNOSIS — T782XXA Anaphylactic shock, unspecified, initial encounter: Secondary | ICD-10-CM

## 2019-08-28 DIAGNOSIS — I480 Paroxysmal atrial fibrillation: Secondary | ICD-10-CM | POA: Diagnosis not present

## 2019-08-28 DIAGNOSIS — Z7984 Long term (current) use of oral hypoglycemic drugs: Secondary | ICD-10-CM | POA: Diagnosis not present

## 2019-08-28 DIAGNOSIS — R579 Shock, unspecified: Secondary | ICD-10-CM | POA: Diagnosis not present

## 2019-08-28 DIAGNOSIS — R061 Stridor: Secondary | ICD-10-CM | POA: Diagnosis not present

## 2019-08-28 DIAGNOSIS — A419 Sepsis, unspecified organism: Secondary | ICD-10-CM | POA: Diagnosis not present

## 2019-08-28 DIAGNOSIS — K264 Chronic or unspecified duodenal ulcer with hemorrhage: Secondary | ICD-10-CM | POA: Diagnosis not present

## 2019-08-28 DIAGNOSIS — T886XXA Anaphylactic reaction due to adverse effect of correct drug or medicament properly administered, initial encounter: Secondary | ICD-10-CM | POA: Diagnosis not present

## 2019-08-28 DIAGNOSIS — I129 Hypertensive chronic kidney disease with stage 1 through stage 4 chronic kidney disease, or unspecified chronic kidney disease: Secondary | ICD-10-CM | POA: Diagnosis not present

## 2019-08-28 DIAGNOSIS — R6510 Systemic inflammatory response syndrome (SIRS) of non-infectious origin without acute organ dysfunction: Secondary | ICD-10-CM | POA: Diagnosis not present

## 2019-08-28 DIAGNOSIS — N184 Chronic kidney disease, stage 4 (severe): Secondary | ICD-10-CM | POA: Diagnosis not present

## 2019-08-28 DIAGNOSIS — D62 Acute posthemorrhagic anemia: Secondary | ICD-10-CM | POA: Diagnosis not present

## 2019-08-28 DIAGNOSIS — Z90710 Acquired absence of both cervix and uterus: Secondary | ICD-10-CM | POA: Diagnosis not present

## 2019-08-28 DIAGNOSIS — I69251 Hemiplegia and hemiparesis following other nontraumatic intracranial hemorrhage affecting right dominant side: Secondary | ICD-10-CM | POA: Diagnosis not present

## 2019-08-28 DIAGNOSIS — K2101 Gastro-esophageal reflux disease with esophagitis, with bleeding: Secondary | ICD-10-CM | POA: Diagnosis not present

## 2019-08-28 DIAGNOSIS — E1122 Type 2 diabetes mellitus with diabetic chronic kidney disease: Secondary | ICD-10-CM | POA: Diagnosis not present

## 2019-08-28 DIAGNOSIS — N179 Acute kidney failure, unspecified: Secondary | ICD-10-CM | POA: Diagnosis not present

## 2019-08-28 DIAGNOSIS — F0390 Unspecified dementia without behavioral disturbance: Secondary | ICD-10-CM | POA: Diagnosis not present

## 2019-08-28 DIAGNOSIS — Z79899 Other long term (current) drug therapy: Secondary | ICD-10-CM | POA: Diagnosis not present

## 2019-08-28 DIAGNOSIS — Z20822 Contact with and (suspected) exposure to covid-19: Secondary | ICD-10-CM | POA: Diagnosis not present

## 2019-08-28 DIAGNOSIS — R52 Pain, unspecified: Secondary | ICD-10-CM | POA: Diagnosis present

## 2019-08-28 DIAGNOSIS — M609 Myositis, unspecified: Secondary | ICD-10-CM | POA: Diagnosis not present

## 2019-08-28 DIAGNOSIS — L03115 Cellulitis of right lower limb: Secondary | ICD-10-CM | POA: Diagnosis not present

## 2019-08-28 DIAGNOSIS — E785 Hyperlipidemia, unspecified: Secondary | ICD-10-CM | POA: Diagnosis not present

## 2019-08-28 DIAGNOSIS — T380X5A Adverse effect of glucocorticoids and synthetic analogues, initial encounter: Secondary | ICD-10-CM | POA: Diagnosis not present

## 2019-08-28 DIAGNOSIS — Z7982 Long term (current) use of aspirin: Secondary | ICD-10-CM | POA: Diagnosis not present

## 2019-08-28 DIAGNOSIS — L03116 Cellulitis of left lower limb: Secondary | ICD-10-CM | POA: Diagnosis not present

## 2019-08-28 LAB — BASIC METABOLIC PANEL
Anion gap: 8 (ref 5–15)
BUN: 33 mg/dL — ABNORMAL HIGH (ref 8–23)
CO2: 18 mmol/L — ABNORMAL LOW (ref 22–32)
Calcium: 7.4 mg/dL — ABNORMAL LOW (ref 8.9–10.3)
Chloride: 114 mmol/L — ABNORMAL HIGH (ref 98–111)
Creatinine, Ser: 1.83 mg/dL — ABNORMAL HIGH (ref 0.44–1.00)
GFR calc Af Amer: 30 mL/min — ABNORMAL LOW (ref >60)
GFR calc non Af Amer: 26 mL/min — ABNORMAL LOW (ref >60)
Glucose, Bld: 160 mg/dL — ABNORMAL HIGH (ref 70–99)
Potassium: 3.6 mmol/L (ref 3.5–5.1)
Sodium: 140 mmol/L (ref 135–145)

## 2019-08-28 LAB — CBC
HCT: 30.4 % — ABNORMAL LOW (ref 36.0–46.0)
Hemoglobin: 9.3 g/dL — ABNORMAL LOW (ref 12.0–15.0)
MCH: 29.9 pg (ref 26.0–34.0)
MCHC: 30.6 g/dL (ref 30.0–36.0)
MCV: 97.7 fL (ref 80.0–100.0)
Platelets: 195 10*3/uL (ref 150–400)
RBC: 3.11 MIL/uL — ABNORMAL LOW (ref 3.87–5.11)
RDW: 14.3 % (ref 11.5–15.5)
WBC: 11.3 10*3/uL — ABNORMAL HIGH (ref 4.0–10.5)
nRBC: 0 % (ref 0.0–0.2)

## 2019-08-28 LAB — GLUCOSE, CAPILLARY
Glucose-Capillary: 133 mg/dL — ABNORMAL HIGH (ref 70–99)
Glucose-Capillary: 214 mg/dL — ABNORMAL HIGH (ref 70–99)
Glucose-Capillary: 212 mg/dL — ABNORMAL HIGH (ref 70–99)
Glucose-Capillary: 315 mg/dL — ABNORMAL HIGH (ref 70–99)

## 2019-08-28 MED ORDER — FAMOTIDINE IN NACL 20-0.9 MG/50ML-% IV SOLN
20.0000 mg | Freq: Every day | INTRAVENOUS | Status: DC
  Administered 2019-08-28: 20 mg via INTRAVENOUS

## 2019-08-28 MED ORDER — ALBUTEROL SULFATE (2.5 MG/3ML) 0.083% IN NEBU
INHALATION_SOLUTION | RESPIRATORY_TRACT | Status: AC
  Filled 2019-08-28: qty 6

## 2019-08-28 MED ORDER — DIPHENHYDRAMINE HCL 50 MG/ML IJ SOLN
50.0000 mg | Freq: Four times a day (QID) | INTRAMUSCULAR | Status: DC
  Administered 2019-08-28 – 2019-08-29 (×4): 50 mg via INTRAVENOUS
  Filled 2019-08-28 (×5): qty 1

## 2019-08-28 MED ORDER — SODIUM CHLORIDE 0.9 % BOLUS PEDS: 30.0000 mL/kg | Freq: Once | INTRAVENOUS | Status: DC

## 2019-08-28 MED ORDER — METHYLPREDNISOLONE SODIUM SUCC 125 MG IJ SOLR
60.0000 mg | Freq: Four times a day (QID) | INTRAMUSCULAR | Status: DC
  Administered 2019-08-28 – 2019-08-29 (×3): 60 mg via INTRAVENOUS
  Filled 2019-08-28 (×3): qty 2

## 2019-08-28 MED ORDER — GLUCAGON HCL RDNA (DIAGNOSTIC) 1 MG IJ SOLR
5.0000 mg/h | INTRAVENOUS | Status: DC
  Filled 2019-08-28: qty 5

## 2019-08-28 MED ORDER — SODIUM CHLORIDE 0.9 % IV BOLUS
1000.0000 mL | Freq: Once | INTRAVENOUS | Status: AC
  Administered 2019-08-28: 1000 mL via INTRAVENOUS

## 2019-08-28 MED ORDER — DIPHENHYDRAMINE HCL 50 MG/ML IJ SOLN
50.0000 mg | Freq: Once | INTRAMUSCULAR | Status: AC
  Administered 2019-08-28: 50 mg via INTRAVENOUS
  Filled 2019-08-28: qty 1

## 2019-08-28 MED ORDER — METHYLPREDNISOLONE SODIUM SUCC 125 MG IJ SOLR
125.0000 mg | Freq: Once | INTRAMUSCULAR | Status: AC
  Administered 2019-08-28: 125 mg via INTRAVENOUS
  Filled 2019-08-28: qty 2

## 2019-08-28 MED ORDER — FAMOTIDINE IN NACL 20-0.9 MG/50ML-% IV SOLN
INTRAVENOUS | Status: AC
  Filled 2019-08-28: qty 50

## 2019-08-28 MED ORDER — RACEPINEPHRINE HCL 2.25 % IN NEBU
0.5000 mL | INHALATION_SOLUTION | Freq: Once | RESPIRATORY_TRACT | Status: AC
  Administered 2019-08-28: 0.5 mL via RESPIRATORY_TRACT

## 2019-08-28 MED ORDER — EPINEPHRINE HCL 5 MG/250ML IV SOLN IN NS
0.5000 ug/min | INTRAVENOUS | Status: DC
  Administered 2019-08-28: 0.5 ug/min via INTRAVENOUS
  Filled 2019-08-28 (×2): qty 250

## 2019-08-28 MED ORDER — RACEPINEPHRINE HCL 2.25 % IN NEBU
0.5000 mL | INHALATION_SOLUTION | RESPIRATORY_TRACT | Status: DC | PRN
  Filled 2019-08-28: qty 0.5

## 2019-08-28 MED ORDER — ALBUTEROL SULFATE (2.5 MG/3ML) 0.083% IN NEBU
2.5000 mg | INHALATION_SOLUTION | Freq: Four times a day (QID) | RESPIRATORY_TRACT | Status: DC | PRN
  Administered 2019-08-28 – 2019-08-29 (×3): 2.5 mg via RESPIRATORY_TRACT
  Filled 2019-08-28: qty 3

## 2019-08-28 NOTE — Progress Notes (Signed)
Modified Barium Swallow Progress Note  Patient Details  Name: Natalie Rodgers MRN: [id number] Date of Birth: [birth year]  Today's Date: 08/28/2019  Modified Barium Swallow completed.  Full report located under Chart Review in the Imaging Section.  Brief recommendations include the following:  Clinical Impression  Pt with mild oral and functional pharyngeal swallow without aspiration of any consistency tested.  Minimal laryngeal penetration with thin noted- Swallow was strong and timely without pharyngeal retention.  Her respiratory and swallowing reciprocity was intact.  Piecemealing with solids functional.  Of note, pt appeared to swallow multiple times with each bolus *when flouro was not turned on - SLP questions if this is simply behavioral.    Recommend advance diet to dys3/thin, medicine with puree and small frequent meals due to pt's dyspnea with intake and to accommodate pt's known esophageal reflux.   Will follow briefly given pt's mild oral deficits/discoordination that may contribute to aspiration of prematurely spilled boluses into larynx.  Of note, daughter Verdis Frederickson was educated to finding when SLP posted swallow precaution sign.   Swallow Evaluation Recommendations       SLP Diet Recommendations: Dysphagia 3 (Mech soft) solids;Thin liquid   Liquid Administration via: Cup;Straw   Medication Administration: Whole meds with puree   Supervision: Patient able to self feed---  Pt must feed self for appropriate proprioception   Compensations: Slow rate;Small sips/bites     Kathleen Lime, MS [facility] SLP Acute Rehab Services Office [phone]             Macario Golds 08/28/2019,10:54 AM

## 2019-08-28 NOTE — Progress Notes (Addendum)
PROGRESS NOTE    [name]  GYB:[id number] DOB: 1940-10-01 DOA: 07/28/2019 PCP: Welford Roche, NP   Brief Narrative: Natalie Rodgers is a 79 y.o. female with PMH significant for HTN, DM2 not on insulin, CKD, dementia, chronic anemia. Patient presented secondary to right leg pain. She resides at [facility]. She was found to be febrile on admission with associated evidence concerning for SIRS with unknown source. She was started on empiric antibiotics. She also developed atrial fibrillation with RVR and started on Diltiazem IV.   Assessment & Plan:   Principal Problem:   SIRS (systemic inflammatory response syndrome) (HCC) Active Problems:   Atrial fibrillation with RVR (HCC)   AKI (acute kidney injury) ([place])   Type 2 diabetes mellitus with hyperlipidemia (HCC)   Essential hypertension   Dementia without behavioral disturbance (HCC)   Bilateral foot pain   SIRS No source. Does not meet sepsis criteria. SIRS criteria met on admission. Urine culture with no growth (final); blood cultures with no growth to date. COVID-19 and influenza negative. Chest x-ray without acute abnormalities. Left foot x-ray with possible evidence of infection. Empirically started on Vancomycin and Cefepime. -Continue Cefepime -Follow-up blood cultures  Atrial fibrillation with RVR Initially started on Diltiazem drip which is now discontinued. Currently in sinus rhythm. On Coreg and aspirin as an outpatient. CHA2DS2-VASc Score is 7. Patient with a history of intracranial hemorrhage. -Continue Coreg  Tachypnea Hypotension Called by nursing secondary to worsening breathing with increased tachypnea and work of breathing. Came to bedside and patient with accessory muscle usage in addition to tachypnea. Worsening mental status from when examined earlier this morning; patient still able to follow commands but speech is more garbled. BP obtained with BPs in 80/60 > 60/40s. BP medications given today  include Coreg, benazepril, amlodipine, BiDil, clonidine (all medications are chronic outpatient medications). Per MAR, patient's amlodipine, benazepril, Coreg held yesterday secondary to BP while on diltiazem drip. Stat chest x-ray and 1L NS bolus ordered. EKG ordered, personally reviewed and normal sinus rhythm. PCCM consulted secondary to worsening respiratory status in addition to hemodynamic instability. After CCM consulted, patient started wheezing with concern for stridor. Patient given racemic epinephrine; solu-medrol, benadryl and Pepcid ordered as well. Possible reaction from barium as that is the only recently new intervention/medication in the the last three days. MRI performed without contrast.  Bilateral foot pain MRI significant for cellulitis with mild myositis of both feet. -Continue Tylenol prn -Cefepime as mentioned above  Leukocytosis Unknown etiology. Trended down slightly. -CBC in AM  Mild right-sided rales Possible aspiration. Clear chest x-ray on admission.  AKI Creatinine of 2.02 on admission. Baseline creatinine on chart review of 0.8 from 2013. Could not find evidence of CKD. Improving on IV fluids. -Continue IV fluids  Diabetes mellitus, type 2 Hemoglobin A1C of 7%. Patient is on glipizide. -Continue SSI  Essential hypertension -Hold benazepril, amlodipine, clonidine, BiDil  Hyperlipidemia -Continue Zetia, pravastatin, fenofibrate  Dementia -Continue Aricept  Dysphagia SLP consulted and barium swallow performed 4/1.     SLP Diet Recommendations: Dysphagia 3 (Mech soft) solids;Thin liquid  Liquid Administration via: Cup;Straw Medication Administration: Whole meds with puree Supervision: Patient able to self feed---  Pt must feed self for appropriate proprioception Compensations: Slow rate;Small sips/bites   DVT prophylaxis: SCDs Code Status:   Code Status: Full Code Family Communication: Daughter at bedside Disposition Plan: Discharge pending  management of cellulitis/myositis in addition to acute stabilization and CCM recommendations   Consultants:   CCM  Procedures:  None  Antimicrobials:  Vancomycin  Cefepime    Subjective: Initially, patient with no issues overnight. On reevaluation, patient more lethargic, slow to respond. Patient is about 2-3 hours from her barium swallow study  Objective: Vitals:   08/28/19 0800 08/28/19 1000 08/28/19 1100 08/28/19 1200  BP: (!) 183/80   (!) 83/31  Pulse: 83 89 91 77  Resp: 20 (!) 21 (!) 27 (!) 30  Temp: 98.3 F (36.8 C)   97.8 F (36.6 C)  TempSrc: Oral   Oral  SpO2: 96% 96% 92% 98%  Weight:      Height:        Intake/Output Summary (Last 24 hours) at 08/28/2019 1210 Last data filed at 08/28/2019 0600 Gross per 24 hour  Intake 1147.55 ml  Output 50 ml  Net 1097.55 ml   Filed Weights   08/07/2019 1537 08/27/19 0135  Weight: 83.9 kg 67.8 kg    Examination:  General exam: Appears calm with respiratory distress Respiratory system: Diminished bilaterally. Respiratory effort increased with accessory muscle usage. Cardiovascular system: S1 & S2 heard, RRR. No murmurs, rubs, gallops or clicks. Gastrointestinal system: Abdomen is nondistended, soft and nontender. No organomegaly or masses felt. Normal bowel sounds heard. Central nervous system: Alert. Baseline right hemiplegia. Able to follow commands. 3/5 strength which is unchanged. Extremities: No edema. No calf tenderness Skin: No cyanosis. No rashes    Data Reviewed: I have personally reviewed following labs and imaging studies  CBC: Recent Labs  Lab 08/19/2019 1405 08/27/19 0256 08/28/19 0237  WBC 14.9* 13.3* 11.3*  NEUTROABS 13.0*  --   --   HGB 12.5 10.7* 9.3*  HCT 40.0 34.5* 30.4*  MCV 95.2 98.0 97.7  PLT 300 223 147   Basic Metabolic Panel: Recent Labs  Lab 08/25/2019 1405 08/27/19 0256 08/28/19 0237  NA 142 139 140  K 4.8 4.1 3.6  CL 105 110 114*  CO2 23 19* 18*  GLUCOSE 186* 188* 160*    BUN 36* 30* 33*  CREATININE 2.02* 1.74* 1.83*  CALCIUM 9.3 7.9* 7.4*   GFR: Estimated Creatinine Clearance: 21.1 mL/min (A) (by C-G formula based on SCr of 1.83 mg/dL (H)). Liver Function Tests: Recent Labs  Lab 07/30/2019 1405  AST 51*  ALT 42  ALKPHOS 60  BILITOT 0.9  PROT 8.6*  ALBUMIN 4.7   No results for input(s): LIPASE, AMYLASE in the last 168 hours. No results for input(s): AMMONIA in the last 168 hours. Coagulation Profile: Recent Labs  Lab 08/05/2019 1405  INR 0.9   Cardiac Enzymes: No results for input(s): CKTOTAL, CKMB, CKMBINDEX, TROPONINI in the last 168 hours. BNP (last 3 results) No results for input(s): PROBNP in the last 8760 hours. HbA1C: Recent Labs    08/15/2019 1405  HGBA1C 7.0*   CBG: Recent Labs  Lab 08/27/19 1313 08/27/19 1720 08/27/19 2248 08/28/19 0753 08/28/19 1143  GLUCAP 146* 164* 166* 133* 214*   Lipid Profile: No results for input(s): CHOL, HDL, LDLCALC, TRIG, CHOLHDL, LDLDIRECT in the last 72 hours. Thyroid Function Tests: No results for input(s): TSH, T4TOTAL, FREET4, T3FREE, THYROIDAB in the last 72 hours. Anemia Panel: No results for input(s): VITAMINB12, FOLATE, FERRITIN, TIBC, IRON, RETICCTPCT in the last 72 hours. Sepsis Labs: Recent Labs  Lab 08/19/2019 1405 07/29/2019 1812  LATICACIDVEN 2.4* 2.0*    Recent Results (from the past 240 hour(s))  Blood culture (routine x 2)     Status: None (Preliminary result)   Collection Time: 08/27/2019  2:05 PM  Specimen: BLOOD  Result Value Ref Range Status   Specimen Description   Final    BLOOD LEFT ANTECUBITAL Performed at [facility] [address], [place], [place] [address]    Special Requests   Final    BOTTLES DRAWN AEROBIC AND ANAEROBIC Blood Culture adequate volume Performed at [facility] [address], [place], [place] [address]    Culture   Final    NO GROWTH 2 DAYS Performed at [facility] [address]  [place], [place] [address]    Report Status PENDING  Incomplete  Respiratory Panel by RT PCR (Flu A&B, Covid) - Nasopharyngeal Swab     Status: None   Collection Time: 08/07/2019  2:18 PM   Specimen: Nasopharyngeal Swab  Result Value Ref Range Status   SARS Coronavirus 2 by RT PCR NEGATIVE NEGATIVE Final    Comment: (NOTE) SARS-CoV-2 target nucleic acids are NOT DETECTED. The SARS-CoV-2 RNA is generally detectable in upper respiratoy specimens during the acute phase of infection. The lowest concentration of SARS-CoV-2 viral copies this assay can detect is 131 copies/mL. A negative result does not preclude SARS-Cov-2 infection and should not be used as the sole basis for treatment or other patient management decisions. A negative result may occur with  improper specimen collection/handling, submission of specimen other than nasopharyngeal swab, presence of viral mutation(s) within the areas targeted by this assay, and inadequate number of viral copies (<131 copies/mL). A negative result must be combined with clinical observations, patient history, and epidemiological information. The expected result is Negative. Fact Sheet for Patients:  PinkCheek.be Fact Sheet for Healthcare Providers:  GravelBags.it This test is not yet ap proved or cleared by the [place] FDA and  has been authorized for detection and/or diagnosis of SARS-CoV-2 by FDA under an Emergency Use Authorization (EUA). This EUA will remain  in effect (meaning this test can be used) for the duration of the COVID-19 declaration under Section 564(b)(1) of the Act, 21 U.S.C. section 360bbb-3(b)(1), unless the authorization is terminated or revoked sooner.    Influenza A by PCR NEGATIVE NEGATIVE Final   Influenza B by PCR NEGATIVE NEGATIVE Final    Comment: (NOTE) The Xpert Xpress SARS-CoV-2/FLU/RSV assay is intended as an aid in  the diagnosis of influenza from  Nasopharyngeal swab specimens and  should not be used as a sole basis for treatment. Nasal washings and  aspirates are unacceptable for Xpert Xpress SARS-CoV-2/FLU/RSV  testing. Fact Sheet for Patients: PinkCheek.be Fact Sheet for Healthcare Providers: GravelBags.it This test is not yet approved or cleared by the [place] FDA and  has been authorized for detection and/or diagnosis of SARS-CoV-2 by  FDA under an Emergency Use Authorization (EUA). This EUA will remain  in effect (meaning this test can be used) for the duration of the  Covid-19 declaration under Section 564(b)(1) of the Act, 21  U.S.C. section 360bbb-3(b)(1), unless the authorization is  terminated or revoked. Performed at [facility], [place] [address], [place], [place] [address]   Blood culture (routine x 2)     Status: None (Preliminary result)   Collection Time: 08/23/2019  2:37 PM   Specimen: BLOOD  Result Value Ref Range Status   Specimen Description   Final    BLOOD RIGHT WRIST Performed at [facility] [address] [place], [place] [address]    Special Requests   Final    BOTTLES DRAWN AEROBIC ONLY Blood Culture results  may not be optimal due to an inadequate volume of blood received in culture bottles Performed at [facility] [address], [place], [place] [address]    Culture   Final    NO GROWTH 2 DAYS Performed at [facility] [address], [place], [place] [address]    Report Status PENDING  Incomplete  Urine culture     Status: None   Collection Time: 08/02/2019  3:19 PM   Specimen: Urine, Catheterized  Result Value Ref Range Status   Specimen Description   Final    URINE, CATHETERIZED Performed at [facility] [address] [place], [place] [address]    Special Requests   Final    NONE Performed at [facility], [place]  [address] [place], [place] [address]    Culture   Final    NO GROWTH Performed at [facility], [place] [address] [place], [place] [address]    Report Status 08/27/2019 FINAL  Final  MRSA PCR Screening     Status: None   Collection Time: 08/27/19  1:46 AM   Specimen: Nasal Mucosa; Nasopharyngeal  Result Value Ref Range Status   MRSA by PCR NEGATIVE NEGATIVE Final    Comment:        The GeneXpert MRSA Assay (FDA approved for NASAL specimens only), is one component of a comprehensive MRSA colonization surveillance program. It is not intended to diagnose MRSA infection nor to guide or monitor treatment for MRSA infections. Performed at [facility], [place] [address] [place], [place] [address]          Radiology Studies: MR FOOT RIGHT WO CONTRAST  Result Date: 08/28/2019 CLINICAL DATA:  Foot pain and swelling. Diabetic. EXAM: MRI OF THE RIGHT FOREFOOT WITHOUT CONTRAST TECHNIQUE: Multiplanar, multisequence MR imaging of the right foot was performed. No intravenous contrast was administered. COMPARISON:  Radiographs 08/27/2019 FINDINGS: Bones/Joint/Cartilage Remote healed second, third and fourth metatarsal neck fractures. No acute fracture is identified. No destructive bony changes to suggest osteomyelitis. No MR findings suspicious for septic arthritis. Mild subcutaneous soft tissue swelling/edema/fluid suggesting cellulitis. No focal soft tissue abscess is identified. Mild edema like changes involving the forefoot musculature suggesting myositis. No MR findings to suggest pyomyositis. The major tendons and ligaments appear intact. The plantar fascia is intact. IMPRESSION: 1. Cellulitis and mild myositis but no focal soft tissue abscess or pyomyositis. 2. Remote healed second, third and fourth metatarsal neck fractures. 3. No MR findings to suggest septic arthritis or osteomyelitis. Electronically Signed   By: Marijo Sanes M.D.   On: 08/28/2019 05:01   MR FOOT LEFT  WO CONTRAST  Result Date: 08/28/2019 CLINICAL DATA:  Pain and swelling. Diabetic. EXAM: MRI OF THE LEFT FOOT WITHOUT CONTRAST TECHNIQUE: Multiplanar, multisequence MR imaging of the left foot was performed. No intravenous contrast was administered. COMPARISON:  Radiographs 08/12/2019 FINDINGS: Diffuse subcutaneous soft tissue swelling/edema/fluid suggesting cellulitis. No discrete soft tissue abscess. There is also mild myositis but no findings for pyomyositis. Mild forefoot and moderate midfoot degenerative changes most notable at the first and second tarsal metatarsal joints with joint space narrowing, joint effusions and subchondral cystic changes. No fracture or findings for osteomyelitis. No findings suspicious for septic arthritis. There is a fragmented appearance of the medial sesamoid of the great toe which could be remote posttraumatic changes or developmental. I do not see any significant signal abnormality to suggest osteomyelitis. The major tendons and ligaments appear intact. The  plantar fascia is intact. Changes of plantar fasciitis are noted. IMPRESSION: 1. Diffuse subcutaneous soft tissue swelling/edema/fluid suggesting cellulitis. No discrete soft tissue abscess. 2. Mild myositis but no findings for pyomyositis. 3. Mild forefoot and moderate midfoot degenerative changes but no findings for septic arthritis or osteomyelitis. 4. Remote posttraumatic or developmental changes involving the medial sesamoid of the great toe. Electronically Signed   By: Marijo Sanes M.D.   On: 08/28/2019 05:08   DG Chest Portable 1 View  Result Date: 08/18/2019 CLINICAL DATA:  Fever EXAM: PORTABLE CHEST 1 VIEW COMPARISON:  None. FINDINGS: Lungs are clear. Heart is borderline enlarged with pulmonary vascularity normal. No adenopathy. There is aortic atherosclerosis. No bone lesions. IMPRESSION: Lungs clear. Borderline cardiac enlargement. Aortic Atherosclerosis (ICD10-I70.0). Electronically Signed   By: Lowella Grip III M.D.   On: 08/21/2019 13:48   DG Foot 2 Views Left  Result Date: 08/17/2019 CLINICAL DATA:  Left-sided foot pain elevated white count EXAM: LEFT FOOT - 2 VIEW COMPARISON:  None. FINDINGS: Partially visualized fixating screws within the distal tibia and distal fibula. No fracture or malalignment. Questionable lucency and erosive change at the sesamoid bone adjacent to the first metatarsal head. Negative for soft tissue emphysema. Moderate plantar calcaneal spur. IMPRESSION: 1. Possible lucency and erosive change at the sesamoid bone adjacent to the head of first metatarsal, correlate with physical exam for signs or symptoms of infection in this region. MRI follow-up could be obtained as clinically warranted. Electronically Signed   By: Donavan Foil M.D.   On: 08/12/2019 18:00   DG Foot Complete Right  Result Date: 08/27/2019 CLINICAL DATA:  Nonresponsive.  Foot pain. EXAM: RIGHT FOOT COMPLETE - 3+ VIEW COMPARISON:  02/24/2016 FINDINGS: Interval healing of the third and fourth metatarsal neck fractures. Remote bimalleolar fracture and repair. Hammertoe deformity on the lateral view. No acute fracture or malalignment. IMPRESSION: No acute finding. Electronically Signed   By: Monte Fantasia M.D.   On: 08/27/2019 10:50   DG Swallowing Func-Speech Pathology  Result Date: 08/28/2019 Objective Swallowing Evaluation: Type of Study: MBS-Modified Barium Swallow Study  Patient Details Name: Natalie Rodgers MRN: [id number] Date of Birth: 1941-01-05 Today's Date: 08/28/2019 Time: SLP Start Time (ACUTE ONLY): 1700 -SLP Stop Time (ACUTE ONLY): 1716 SLP Time Calculation (min) (ACUTE ONLY): 16 min Past Medical History: Past Medical History: Diagnosis Date . Chronic kidney disease   CKD Stage IV . Dementia ([place])  . Diabetes mellitus without complication ([place])  . GERD (gastroesophageal reflux disease)  . Hypertension  Past Surgical History: Past Surgical History: Procedure Laterality Date . ABDOMINAL  HYSTERECTOMY    salpinoophorectomy . LAPAROTOMY   HPI: Pt is 79 yo female with PMH including HTN, DM2, CKD, mild dementia, anemia, oncology care, hx of ICH with R hemiparesis 2016. Pt presented to ED with complaint of leg and foot pain.  Imaging of feet - negative for acute fx.  Pt admitted with SIRS (no clear source per note) and afib with RVR.    Swallow evaluation ordered due to pt overtly coughing with water today.  RN reports pt tolerated po medications well.  Pt was tachycardic and febrile upon admission.  Urinalysis was negative as well CXR.  Pt resides at [facility].  Per MD note, pt follows some directions and is disoriented to location.  Pt denies difficulty with swallowing.  Subjective: pt sitting upright in swallow function chair Assessment / Plan / Recommendation CHL IP CLINICAL IMPRESSIONS 08/28/2019 Clinical Impression Pt with mild oral and functional pharyngeal  swallow without aspiration of any consistency tested.  Minimal laryngeal penetration with thin noted- Swallow was strong and timely without pharyngeal retention.  Her respiratory and swallowing reciprocity was intact.  Piecemealing with solids functional.  Of note, pt appeared to swallow multiple times with each bolus *when flouro was not turned on - SLP questions if this is simply behavioral.  Recommend advance diet to dys3/thin, medicine with puree and small frequent meals due to pt's dyspnea with intake and to accommodate pt's known esophageal reflux. Will follow briefly given pt's mild oral deficits/discoordination that may contribute to aspiration of prematurely spilled boluses into larynx.  Of note, daughter Verdis Frederickson was educated to finding when SLP posted swallow precaution sign. SLP Visit Diagnosis Dysphagia, oral phase (R13.11) Attention and concentration deficit following -- Frontal lobe and executive function deficit following -- Impact on safety and function Mild aspiration risk   CHL IP TREATMENT RECOMMENDATION 08/28/2019 Treatment  Recommendations Therapy as outlined in treatment plan below   Prognosis 08/28/2019 Prognosis for Safe Diet Advancement Good Barriers to Reach Goals Cognitive deficits Barriers/Prognosis Comment -- CHL IP DIET RECOMMENDATION 08/28/2019 SLP Diet Recommendations Dysphagia 3 (Mech soft) solids;Thin liquid Liquid Administration via Cup;Straw Medication Administration Whole meds with puree Compensations Slow rate;Small sips/bites Postural Changes --   No flowsheet data found.  CHL IP FOLLOW UP RECOMMENDATIONS 08/28/2019 Follow up Recommendations None   CHL IP FREQUENCY AND DURATION 08/28/2019 Speech Therapy Frequency (ACUTE ONLY) min 1 x/week Treatment Duration 1 week      CHL IP ORAL PHASE 08/28/2019 Oral Phase Impaired Oral - Pudding Teaspoon -- Oral - Pudding Cup -- Oral - Honey Teaspoon -- Oral - Honey Cup -- Oral - Nectar Teaspoon -- Oral - Nectar Cup Premature spillage;Decreased bolus cohesion Oral - Nectar Straw -- Oral - Thin Teaspoon -- Oral - Thin Cup Premature spillage;Decreased bolus cohesion Oral - Thin Straw Premature spillage;Decreased bolus cohesion Oral - Puree WFL Oral - Mech Soft WFL Oral - Regular -- Oral - Multi-Consistency -- Oral - Pill WFL Oral Phase - Comment --  CHL IP PHARYNGEAL PHASE 08/28/2019 Pharyngeal Phase Impaired Pharyngeal- Pudding Teaspoon -- Pharyngeal -- Pharyngeal- Pudding Cup -- Pharyngeal -- Pharyngeal- Honey Teaspoon -- Pharyngeal -- Pharyngeal- Honey Cup -- Pharyngeal -- Pharyngeal- Nectar Teaspoon -- Pharyngeal -- Pharyngeal- Nectar Cup [facility] Pharyngeal Material does not enter airway Pharyngeal- Nectar Straw WFL Pharyngeal Material does not enter airway Pharyngeal- Thin Teaspoon -- Pharyngeal -- Pharyngeal- Thin Cup WFL;Penetration/Aspiration during swallow Pharyngeal Material enters airway, remains ABOVE vocal cords then ejected out Pharyngeal- Thin Straw WFL Pharyngeal Material does not enter airway Pharyngeal- Puree WFL Pharyngeal Material does not enter airway Pharyngeal- Mechanical Soft  WFL Pharyngeal Material does not enter airway Pharyngeal- Regular -- Pharyngeal -- Pharyngeal- Multi-consistency -- Pharyngeal -- Pharyngeal- Pill WFL Pharyngeal Material does not enter airway Pharyngeal Comment --  CHL IP CERVICAL ESOPHAGEAL PHASE 08/28/2019 Cervical Esophageal Phase -- Pudding Teaspoon -- Pudding Cup -- Honey Teaspoon -- Honey Cup -- Nectar Teaspoon -- Nectar Cup -- Nectar Straw -- Thin Teaspoon -- Thin Cup -- Thin Straw -- Puree -- Mechanical Soft -- Regular -- Multi-consistency -- Pill -- Cervical Esophageal Comment Upon esophageal sweep, pt appears with trace residuals, radiologist not present to confirm. Kathleen Lime, MS [facility] SLP Acute Rehab Services Office [phone] Macario Golds 08/28/2019, 10:55 AM                   Scheduled Meds: . aspirin EC  81 mg Oral Daily  .  Chlorhexidine Gluconate Cloth  6 each Topical Daily  . donepezil  10 mg Oral QHS  . ezetimibe  10 mg Oral Daily  . fenofibrate  160 mg Oral Daily  . insulin aspart  0-5 Units Subcutaneous QHS  . insulin aspart  0-9 Units Subcutaneous TID WC  . pravastatin  80 mg Oral QHS  . QUEtiapine  25 mg Oral Daily  . QUEtiapine  50 mg Oral QHS  . senna  1 tablet Oral BID  . sodium chloride flush  3 mL Intravenous Q12H   Continuous Infusions: . ceFEPime (MAXIPIME) IV Stopped (08/27/19 1518)  . sodium chloride       LOS: 2 days  CRITICAL CARE Performed by: Cordelia Poche, MD   Total critical care time: 50 minutes  Critical care time was exclusive of separately billable procedures and treating other patients.  Critical care was necessary to treat or prevent imminent or life-threatening deterioration.  Critical care was time spent personally by me on the following activities: development of treatment plan with patient and/or surrogate as well as nursing, discussions with consultants, evaluation of patient's response to treatment, examination of patient, obtaining history from patient or surrogate, ordering and  performing treatments and interventions, ordering and review of laboratory studies, ordering and review of radiographic studies, pulse oximetry and re-evaluation of patient's condition.   Cordelia Poche, MD [facility] 08/28/2019, 12:10 PM  If 7PM-7AM, please contact night-coverage [email or web address]

## 2019-08-28 NOTE — Plan of Care (Signed)
  Problem: Coping: Goal: Level of anxiety will decrease Outcome: Progressing   Problem: Elimination: Goal: Will not experience complications related to bowel motility Outcome: Adequate for Discharge Goal: Will not experience complications related to urinary retention Outcome: Adequate for Discharge

## 2019-08-28 NOTE — Consult Note (Signed)
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 She had Afib with RVR at admission, on diltiazem infusion. She has been treated with cefepime.   She had a barium swallow that was completed uneventfully on 4/2 without evidence of aspiration. About 2 hours later she developed SOB and progressive shock. She had been previously having hypertension, which was treated with her home antihypertensive regimen. PCCM was consulted for evaluation due to shock and SOB.  Past Medical History  Hemorrhagic CVA with residual RUE hemiparesis DM Dementia, lives in SNF HTN  Significant Hospital Events     Consults:  PCCM  Procedures:    Significant Diagnostic Tests:    Micro Data:  3/30 blood culture>> 3/30 urine culture- NG final  Antimicrobials:  Cefepime 3/30>> Vancomycin 3/30  Interim history/subjective:    Objective   Blood pressure (!) 122/103, pulse 79, temperature 98.1 F (36.7 C), temperature source Oral, resp. rate (!) 28, height 5' (1.524 m), weight 67.8 kg, SpO2 100 %.        Intake/Output Summary (Last 24 hours) at 08/28/2019 1905 Last data filed at 08/28/2019 1500 Gross per 24 hour  Intake 3243.82 ml  Output 50 ml  Net 3193.82 ml   Filed Weights   07/30/2019 1537 08/27/19 0135  Weight: 83.9 kg 67.8 kg    Examination: General: Elderly woman sitting up in bed in respiratory distress, using abdominal inspiratory and expiratory muscles HENT: [place]/AT, eyes anicteric Lungs: Minimal breath sounds, high-pitched inspiratory stridor.  Well on nasal  cannula. Cardiovascular: Tachycardic, regular rhythm Abdomen: Obese, soft, nontender Extremities: No clubbing, cyanosis.  Significant dependent edema Neuro: Awake and alert, dysarthric.  Falling asleep easily and not always attentive.   Resolved Hospital Problem list     Assessment & Plan:   Sudden onset shock and stridor concerning for anaphylaxis, although no rash or GI symptoms are present.  Precipitating cause potentially barium. Case reports describing barium-related anaphylaxis [email or web address].nejm.org/doi/full/10.1056/[email or web address] -Treated with racemic epinephrine, Solu-Medrol, Pepcid, Benadryl with quick improvement in stridor.  Intubation was able to be avoided. -Epinephrine infusion- titrate to MAP >65 -Holding antihypertensives -Closely monitoring in the ICU with low threshold for intubation if there is evidence of stridor not quickly resolving with racemic epinephrine or frequently requiring racemic epinephrine nebs.  The patient's daughter at bedside was very apprehensive about possible intubation given her recent negative experience with a family member who died on a mechanical ventilator. -Medication list reviewed.  Only new meds this admission are antibiotics and barium.  Like to continue to monitor closely during cefepime dosing.  Barium added to allergy list.  Dysarthria- suspect this is related to angioedema that precipitated stridor. -Given shock and acuity of airway situation, stroke protocol not pursued.  She would not be a candidate for TPA given history of hemorrhagic stroke. -Continue to monitor  AKI with likely background CKD -Continue to monitor renal function -Renally dose meds, avoid nephrotoxic meds  DM, exacerbated by steroids. -Continue Accu-Cheks every 4 hours with sliding scale insulin as needed. -Goal BG 140-180 while admitted to the  ICU  Afib, CHADS VASc 7 -Monitor for  tachycardia on epinephrine infusion. -May require amiodarone administration -Anticoagulation not administered given history of previous hemorrhagic CVA.  Best practice:  Code Status:full Family Communication: daughter updated at bedside Disposition: ICU  Labs   CBC: Recent Labs  Lab 07/29/2019 1405 08/27/19 0256 08/28/19 0237  WBC 14.9* 13.3* 11.3*  NEUTROABS 13.0*  --   --   HGB 12.5 10.7* 9.3*  HCT 40.0 34.5* 30.4*  MCV 95.2 98.0 97.7  PLT 300 223 947    Basic Metabolic Panel: Recent Labs  Lab 07/30/2019 1405 08/27/19 0256 08/28/19 0237  NA 142 139 140  K 4.8 4.1 3.6  CL 105 110 114*  CO2 23 19* 18*  GLUCOSE 186* 188* 160*  BUN 36* 30* 33*  CREATININE 2.02* 1.74* 1.83*  CALCIUM 9.3 7.9* 7.4*   GFR: Estimated Creatinine Clearance: 21.1 mL/min (A) (by C-G formula based on SCr of 1.83 mg/dL (H)). Recent Labs  Lab 08/10/2019 1405 08/07/2019 1812 08/27/19 0256 08/28/19 0237  WBC 14.9*  --  13.3* 11.3*  LATICACIDVEN 2.4* 2.0*  --   --     Liver Function Tests: Recent Labs  Lab 08/21/2019 1405  AST 51*  ALT 42  ALKPHOS 60  BILITOT 0.9  PROT 8.6*  ALBUMIN 4.7   No results for input(s): LIPASE, AMYLASE in the last 168 hours. No results for input(s): AMMONIA in the last 168 hours.  ABG No results found for: PHART, PCO2ART, PO2ART, HCO3, TCO2, ACIDBASEDEF, O2SAT   Coagulation Profile: Recent Labs  Lab 07/28/2019 1405  INR 0.9    Cardiac Enzymes: No results for input(s): CKTOTAL, CKMB, CKMBINDEX, TROPONINI in the last 168 hours.  HbA1C: Hgb A1c MFr Bld  Date/Time Value Ref Range Status  08/22/2019 02:05 PM 7.0 (H) 4.8 - 5.6 % Final    Comment:    (NOTE) Pre diabetes:          5.7%-6.4% Diabetes:              >6.4% Glycemic control for   <7.0% adults with diabetes     CBG: Recent Labs  Lab 08/27/19 1720 08/27/19 2248 08/28/19 0753 08/28/19 1143 08/28/19 1644  GLUCAP 164* 166* 133* 214* 212*    Review of Systems:   Unable to be obtained  due to acuity of situation  Past Medical History  She,  has a past medical history of Chronic kidney disease, Dementia ([place]), Diabetes mellitus without complication ([place]), GERD (gastroesophageal reflux disease), and Hypertension.   Surgical History    Past Surgical History:  Procedure Laterality Date  . ABDOMINAL HYSTERECTOMY     salpinoophorectomy  . LAPAROTOMY       Social History   reports that she has never smoked. She has never used smokeless tobacco. She reports that she does not drink alcohol or use drugs.   Family History   Her family history is not on file.   Allergies No Known Allergies   Home Medications  Prior to Admission medications   Medication Sig Start Date End Date Taking? Authorizing Provider  amLODipine-benazepril (LOTREL) 10-20 MG capsule Take 1 capsule by mouth daily.   Yes [provider]  aspirin EC 81 MG tablet Take 81 mg by mouth daily.   Yes [provider]  carvedilol (COREG) 25 MG tablet Take 25 mg by mouth 2 (two) times daily with a meal.   Yes [provider]  chlorthalidone (HYGROTON) 25 MG tablet Take 12.5 mg by mouth  daily.   Yes [provider]  cloNIDine (CATAPRES) 0.1 MG tablet Take 0.1 mg by mouth 2 (two) times daily.   Yes [provider]  Cranberry 450 MG TABS Take 450 mg by mouth daily.   Yes [provider]  donepezil (ARICEPT) 10 MG tablet Take 10 mg by mouth at bedtime.   Yes [provider]  ezetimibe (ZETIA) 10 MG tablet Take 10 mg by mouth daily.   Yes [provider]  famotidine (PEPCID) 20 MG tablet Take 20 mg by mouth at bedtime.   Yes [provider]  fenofibrate 160 MG tablet Take 160 mg by mouth daily.   Yes [provider]  glipiZIDE (GLUCOTROL) 10 MG tablet Take 10 mg by mouth daily before breakfast.   Yes [provider]  isosorbide-hydrALAZINE (BIDIL) 20-37.5 MG tablet Take 1 tablet by mouth 3 (three) times daily.   Yes  [provider]  melatonin 1 MG TABS tablet Take 2 mg by mouth at bedtime.   Yes [provider]  pravastatin (PRAVACHOL) 80 MG tablet Take 80 mg by mouth at bedtime.   Yes [provider]  QUEtiapine (SEROQUEL) 25 MG tablet Take 25 mg by mouth daily.   Yes [provider]  QUEtiapine (SEROQUEL) 50 MG tablet Take 50 mg by mouth at bedtime.   Yes [provider]  sitaGLIPtin (JANUVIA) 25 MG tablet Take 25 mg by mouth daily.   Yes [provider]  Vitamin D3 (VITAMIN D) 25 MCG tablet Take 2,000 Units by mouth daily.   Yes [provider]     This patient is critically ill with multiple organ system failure which requires frequent high complexity decision making, assessment, support, evaluation, and titration of therapies. This was completed through the application of advanced monitoring technologies and extensive interpretation of multiple databases. During this encounter critical care time was devoted to patient care services described in this note for 80 minutes.  Julian Hy, DO 08/28/19 7:22 PM [facility]

## 2019-08-28 NOTE — Progress Notes (Signed)
Per Dr. Sampson Si for her to go for swallow eval without nurse.

## 2019-08-28 NOTE — Progress Notes (Signed)
eLink Physician-Brief Progress Note Patient Name: Natalie Rodgers DOB: 07-21-1940 MRN: [id number]   Date of Service  08/28/2019  HPI/Events of Note  Pt is NPO but ordered for PO meds. RN asks whether to hold PO meds tonight. Reviewed med list.   eICU Interventions  Agree with holding PO meds overnight given recent stridor.     Intervention Category Minor Interventions: Other:  Charlott Rakes 08/28/2019, 8:57 PM

## 2019-08-28 NOTE — Progress Notes (Signed)
Pt noted to have increased resp rate and effort.  sats dropping to low 90's.  Upper airway wheezing noted.  MD at bedside.

## 2019-08-28 NOTE — Progress Notes (Addendum)
MBS completed, full report to follow.  Preliminary results are as follows.  Pt with functional oropharyngeal swallow without aspiration of any consistency tested.  Minimal laryngeal penetration with thin noted- Swallow was strong and timely without pharyngeal retention.  Her respiratory and swallowing reciprocity was intact.  Piecemealing with solids functional.  Of note, pt appeared to swallow multiple times with each bolus *when flouro was not turned on - SLP questions if this is simply behavioral.  Recommend advance diet to dys3/thin, medicine with puree and small frequent meals due to pt's dyspnea with intake. Will follow briefly.   Kathleen Lime, MS [place] Office [phone]

## 2019-08-28 NOTE — Progress Notes (Signed)
PT Cancellation Note  Patient Details Name: Natalie Rodgers MRN: [id number] DOB: [birth year]   Cancelled Treatment:    Reason Eval/Treat Not Completed: Medical issues which prohibited therapy, onset of increased respiratory distress. Will check back another rime.   Claretha Cooper 08/28/2019, 1:38 PM [place] Pager [phone] Office [phone]

## 2019-08-28 DEATH — deceased

## 2019-08-29 DIAGNOSIS — N179 Acute kidney failure, unspecified: Secondary | ICD-10-CM | POA: Diagnosis not present

## 2019-08-29 DIAGNOSIS — M79671 Pain in right foot: Secondary | ICD-10-CM | POA: Diagnosis not present

## 2019-08-29 DIAGNOSIS — R6510 Systemic inflammatory response syndrome (SIRS) of non-infectious origin without acute organ dysfunction: Secondary | ICD-10-CM | POA: Diagnosis not present

## 2019-08-29 DIAGNOSIS — A419 Sepsis, unspecified organism: Secondary | ICD-10-CM | POA: Diagnosis not present

## 2019-08-29 DIAGNOSIS — I4891 Unspecified atrial fibrillation: Secondary | ICD-10-CM | POA: Diagnosis not present

## 2019-08-29 DIAGNOSIS — R52 Pain, unspecified: Secondary | ICD-10-CM | POA: Diagnosis not present

## 2019-08-29 LAB — BASIC METABOLIC PANEL
Anion gap: 9 (ref 5–15)
BUN: 40 mg/dL — ABNORMAL HIGH (ref 8–23)
CO2: 18 mmol/L — ABNORMAL LOW (ref 22–32)
Calcium: 7.9 mg/dL — ABNORMAL LOW (ref 8.9–10.3)
Chloride: 113 mmol/L — ABNORMAL HIGH (ref 98–111)
Creatinine, Ser: 1.97 mg/dL — ABNORMAL HIGH (ref 0.44–1.00)
GFR calc Af Amer: 27 mL/min — ABNORMAL LOW (ref >60)
GFR calc non Af Amer: 23 mL/min — ABNORMAL LOW (ref >60)
Glucose, Bld: 322 mg/dL — ABNORMAL HIGH (ref 70–99)
Potassium: 3.9 mmol/L (ref 3.5–5.1)
Sodium: 140 mmol/L (ref 135–145)

## 2019-08-29 LAB — GLUCOSE, CAPILLARY
Glucose-Capillary: 242 mg/dL — ABNORMAL HIGH (ref 70–99)
Glucose-Capillary: 263 mg/dL — ABNORMAL HIGH (ref 70–99)
Glucose-Capillary: 268 mg/dL — ABNORMAL HIGH (ref 70–99)
Glucose-Capillary: 287 mg/dL — ABNORMAL HIGH (ref 70–99)

## 2019-08-29 LAB — CBC
HCT: 32.2 % — ABNORMAL LOW (ref 36.0–46.0)
Hemoglobin: 10.1 g/dL — ABNORMAL LOW (ref 12.0–15.0)
MCH: 30.3 pg (ref 26.0–34.0)
MCHC: 31.4 g/dL (ref 30.0–36.0)
MCV: 96.7 fL (ref 80.0–100.0)
Platelets: 230 10*3/uL (ref 150–400)
RBC: 3.33 MIL/uL — ABNORMAL LOW (ref 3.87–5.11)
RDW: 14.4 % (ref 11.5–15.5)
WBC: 11.9 10*3/uL — ABNORMAL HIGH (ref 4.0–10.5)
nRBC: 0 % (ref 0.0–0.2)

## 2019-08-29 MED ORDER — SODIUM CHLORIDE 0.9 % IV SOLN
INTRAVENOUS | Status: DC | PRN
  Administered 2019-08-29: 1000 mL via INTRAVENOUS

## 2019-08-29 MED ORDER — MELATONIN 1 MG PO TABS
2.0000 mg | ORAL_TABLET | Freq: Every day | ORAL | Status: DC
  Administered 2019-08-29 – 2019-09-03 (×6): 2 mg via ORAL
  Filled 2019-08-29 (×7): qty 2

## 2019-08-29 MED ORDER — ISOSORB DINITRATE-HYDRALAZINE 20-37.5 MG PO TABS
1.0000 | ORAL_TABLET | Freq: Three times a day (TID) | ORAL | Status: DC
  Administered 2019-08-29 – 2019-09-03 (×17): 1 via ORAL
  Filled 2019-08-29 (×21): qty 1

## 2019-08-29 MED ORDER — DIPHENHYDRAMINE HCL 50 MG/ML IJ SOLN: 50.0000 mg | Freq: Four times a day (QID) | INTRAMUSCULAR | Status: DC | PRN

## 2019-08-29 MED ORDER — AMLODIPINE BESYLATE 10 MG PO TABS
10.0000 mg | ORAL_TABLET | Freq: Every day | ORAL | Status: DC
  Administered 2019-08-29 – 2019-09-02 (×5): 10 mg via ORAL
  Filled 2019-08-29 (×5): qty 1

## 2019-08-29 NOTE — Progress Notes (Signed)
Physical Therapy Treatment Patient Details Name: Natalie Rodgers MRN: [id number] DOB: April 07, 1941 Today's Date: 08/29/2019    History of Present Illness Pt is 79 yo female with PMH including HTN, DM2, CKD, mild dementia, anemia, hx of CVA with R hemiparesis. Pt presented to ED with complaint of leg and foot pain.  Imaging of feet - negative for acute fx.  Pt admitted with SIRS (no clear source per note) and afib with RVR.    PT Comments    The patient in bed, speaking in her native  Language. Patient able to communicate with Therapist, states she needs to find her WC. Patient was assisted to mobilize to sitting. Once near sitting, patient became very resistive ans anxious and began to lie back down. Assisted legs back onto bed. Patient positioned in bed in a partial chair position. Continue mobility efforts.  Follow Up Recommendations  SNF     Equipment Recommendations  Other (comment)    Recommendations for Other Services       Precautions / Restrictions Precautions Precautions: Fall Precaution Comments: painful left foot    Mobility  Bed Mobility Overal bed mobility: Needs Assistance Bed Mobility: Supine to Sit;Sit to Supine     Supine to sit: Max assist Sit to supine: Max assist   General bed mobility comments: attempted to sit on the bed edge, assisted legs and trunk. patient became resitive to sitting up and began to lean back into bed. patient visibly anxious, noted wheezing. Calmed patient and repositioned in partial bed chair pasition  Transfers                    Ambulation/Gait                 Stairs             Wheelchair Mobility    Modified Rankin (Stroke Patients Only)       Balance Overall balance assessment: Needs assistance Sitting-balance support: Bilateral upper extremity supported;Feet unsupported Sitting balance-Leahy Scale: Zero Sitting balance - Comments: pushing  backward, very anxious                                     Cognition Arousal/Alertness: Awake/alert Behavior During Therapy: WFL for tasks assessed/performed                                   General Comments: Patient is constantly  speaking in her native language but does speak english, she says she lives in ALF, Gets iaround in a WC.      Exercises      General Comments        Pertinent Vitals/Pain Pain Assessment: Faces Faces Pain Scale: Hurts even more Pain Location: bilateral feet Pain Descriptors / Indicators: Grimacing;Moaning Pain Intervention(s): Monitored during session    Home Living                      Prior Function            PT Goals (current goals can now be found in the care plan section) Progress towards PT goals: Progressing toward goals    Frequency    Min 2X/week      PT Plan Current plan remains appropriate    Co-evaluation  AM-PAC PT "6 Clicks" Mobility   Outcome Measure  Help needed turning from your back to your side while in a flat bed without using bedrails?: Total Help needed moving from lying on your back to sitting on the side of a flat bed without using bedrails?: Total Help needed moving to and from a bed to a chair (including a wheelchair)?: Total Help needed standing up from a chair using your arms (e.g., wheelchair or bedside chair)?: Total Help needed to walk in hospital room?: Total Help needed climbing 3-5 steps with a railing? : Total 6 Click Score: 6    End of Session   Activity Tolerance: Patient limited by pain(extremely anxious with attempts) Patient left: in bed;with bed alarm set;with call bell/phone within reach Nurse Communication: Mobility status;Need for lift equipment PT Visit Diagnosis: Pain;Muscle weakness (generalized) (M62.81);Unsteadiness on feet (R26.81) Pain - Right/Left: Left Pain - part of body: Ankle and joints of foot     Time: 1610-9604 PT Time Calculation (min) (ACUTE ONLY): 39  min  Charges:  $Therapeutic Activity: 38-52 mins                     Tresa Endo PT Acute Rehabilitation Services Pager [phone] Office [phone]    Claretha Cooper 08/29/2019, 2:10 PM

## 2019-08-29 NOTE — Progress Notes (Signed)
Pharmacy Antibiotic Note  Natalie Rodgers is a 79 y.o. female presented to the ED from [facility] facility on 08/04/2019 with c/o leg pain. In the ED, she was found to be febrile with elevated wbc. Pharmacy has been consulted to dose cefepime for suspected sepsis.  Today, 08/29/19  Day 4 Cefepime  Afebrile  WBC 11.9 unchanged  SCr 1.97 up CrCl 20  Cultures still ngtd   Plan:  Continue cefepime 2g IV q24h   f/u renal function closely and adjust dose if/when appropriate  __________________________  Height: 5' (152.4 cm) Weight: 67.8 kg (149 lb 7.6 oz) IBW/kg (Calculated) : 45.5  Temp (24hrs), Avg:98.4 F (36.9 C), Min:97.5 F (36.4 C), Max:99.4 F (37.4 C)  Recent Labs  Lab 08/11/2019 1405 08/11/2019 1812 08/27/19 0256 08/28/19 0237 08/29/19 0301  WBC 14.9*  --  13.3* 11.3* 11.9*  CREATININE 2.02*  --  1.74* 1.83* 1.97*  LATICACIDVEN 2.4* 2.0*  --   --   --     Estimated Creatinine Clearance: 19.6 mL/min (A) (by C-G formula based on SCr of 1.97 mg/dL (H)).    Allergies  Allergen Reactions  . Barium-Containing Compounds Anaphylaxis    Stridor, shock     Thank you for allowing pharmacy to be a part of this patient's care.  Kara Mead 08/29/2019 12:42 PM

## 2019-08-29 NOTE — Plan of Care (Signed)
Patient examined with Dr. Lonny Prude.  Off vasopressors.  No stridor or shortness of breath.  Speaking very easily-back to her baseline.  PCCM will sign off.  Please call with questions.  Julian Hy, DO 08/29/19 10:02 AM [facility]

## 2019-08-29 NOTE — Progress Notes (Signed)
PROGRESS NOTE    Natalie Rodgers  GUY:[id number] DOB: 29-Aug-1940 DOA: 08/05/2019 PCP: Welford Roche, NP   Brief Narrative: Natalie Rodgers is a 79 y.o. female with PMH significant for HTN, DM2 not on insulin, CKD, dementia, chronic anemia. Patient presented secondary to right leg pain. She resides at [facility]. She was found to be febrile on admission with associated evidence concerning for SIRS with unknown source. She was started on empiric antibiotics. She also developed atrial fibrillation with RVR and started on Diltiazem IV.   Assessment & Plan:   Principal Problem:   SIRS (systemic inflammatory response syndrome) (HCC) Active Problems:   Atrial fibrillation with RVR (HCC)   AKI (acute kidney injury) ([place])   Type 2 diabetes mellitus with hyperlipidemia (HCC)   Essential hypertension   Dementia without behavioral disturbance (HCC)   Bilateral foot pain   Sepsis Source of bilateral foot cellulitis and myositis. Present on admission. Does not meet sepsis criteria. SIRS criteria met on admission. Urine culture with no growth (final); blood cultures with no growth to date. COVID-19 and influenza negative. Chest x-ray without acute abnormalities. Left foot x-ray with possible evidence of infection. Empirically started on Vancomycin and Cefepime. Now on Cefepime. Blood cultures no growth to date. -Continue Cefepime -Follow-up blood cultures  Atrial fibrillation with RVR Initially started on Diltiazem drip which is now discontinued. Currently in sinus rhythm. On Coreg and aspirin as an outpatient. CHA2DS2-VASc Score is 7. Patient with a history of intracranial hemorrhage. -Continue Coreg  Anaphylactic shock Unknown etiology. Possibly related to barium vs food ingestion. Patient had stridor and hypotension requiring racemic epinephrine, solu-medrol, Pepcid, benadryl. Patient required norepinephrine for blood pressure support which is now discontinued. -Continue  benadryl -Watch in stepdown unit  Foot cellulitis MRI significant for cellulitis with mild myositis of both feet. -Continue Tylenol prn -Cefepime as mentioned above  Leukocytosis Unknown etiology. Trended down slightly. -CBC in AM  Mild right-sided rales Possible aspiration. Clear chest x-ray on admission.  AKI Creatinine of 2.02 on admission. Baseline creatinine on chart review of 0.8 from 2013. Could not find evidence of CKD. Initially improved on IV fluids but stabilized around 1.8 -Continue IV fluids  Diabetes mellitus, type 2 Hemoglobin A1C of 7%. Patient is on glipizide. -Continue SSI  Essential hypertension -Will restart BP BiDil today -Hold benazepril, amlodipine, clonidine  Hyperlipidemia -Continue Zetia, pravastatin, fenofibrate  Dementia -Continue Aricept  Dysphagia SLP consulted and barium swallow performed 4/1.     SLP Diet Recommendations: Dysphagia 3 (Mech soft) solids;Thin liquid  Liquid Administration via: Cup;Straw Medication Administration: Whole meds with puree Supervision: Patient able to self feed---  Pt must feed self for appropriate proprioception Compensations: Slow rate;Small sips/bites   DVT prophylaxis: SCDs Code Status:   Code Status: Full Code Family Communication: Daughter at bedside Disposition Plan: Discharge pending management of cellulitis/myositis in addition to acute stabilization and CCM recommendations   Consultants:   CCM  Procedures:   None  Antimicrobials:  Vancomycin  Cefepime    Subjective: Patient is doing well today. Mild foot pain.  Objective: Vitals:   08/29/19 0346 08/29/19 0400 08/29/19 0500 08/29/19 0810  BP:  107/78 (!) 158/116   Pulse:  84 84   Resp:  18 (!) 21   Temp: (!) 97.5 F (36.4 C)   98.5 F (36.9 C)  TempSrc: Axillary   Oral  SpO2:  100% 99%   Weight:      Height:        Intake/Output Summary (Last 24  hours) at 08/29/2019 0842 Last data filed at 08/29/2019 0500 Gross per  24 hour  Intake 2539.32 ml  Output 1150 ml  Net 1389.32 ml   Filed Weights   08/05/2019 1537 08/27/19 0135  Weight: 83.9 kg 67.8 kg    Examination:  General exam: Appears calm and comfortable Respiratory system: Clear to auscultation. Respiratory effort normal. Cardiovascular system: S1 & S2 heard, RRR. No murmurs, rubs, gallops or clicks. Gastrointestinal system: Abdomen is nondistended, soft and nontender. No organomegaly or masses felt. Normal bowel sounds heard. Central nervous system: Alert and oriented. Right hemiplegia. 4/5 strength on left vs 3/5 strength on right. Extremities: Mild foot pain bilaterally. No calf tenderness Skin: No cyanosis. No rashes    Data Reviewed: I have personally reviewed following labs and imaging studies  CBC: Recent Labs  Lab 08/03/2019 1405 08/27/19 0256 08/28/19 0237 08/29/19 0301  WBC 14.9* 13.3* 11.3* 11.9*  NEUTROABS 13.0*  --   --   --   HGB 12.5 10.7* 9.3* 10.1*  HCT 40.0 34.5* 30.4* 32.2*  MCV 95.2 98.0 97.7 96.7  PLT 300 223 195 573   Basic Metabolic Panel: Recent Labs  Lab 08/23/2019 1405 08/27/19 0256 08/28/19 0237 08/29/19 0301  NA 142 139 140 140  K 4.8 4.1 3.6 3.9  CL 105 110 114* 113*  CO2 23 19* 18* 18*  GLUCOSE 186* 188* 160* 322*  BUN 36* 30* 33* 40*  CREATININE 2.02* 1.74* 1.83* 1.97*  CALCIUM 9.3 7.9* 7.4* 7.9*   GFR: Estimated Creatinine Clearance: 19.6 mL/min (A) (by C-G formula based on SCr of 1.97 mg/dL (H)). Liver Function Tests: Recent Labs  Lab 08/17/2019 1405  AST 51*  ALT 42  ALKPHOS 60  BILITOT 0.9  PROT 8.6*  ALBUMIN 4.7   No results for input(s): LIPASE, AMYLASE in the last 168 hours. No results for input(s): AMMONIA in the last 168 hours. Coagulation Profile: Recent Labs  Lab 08/27/2019 1405  INR 0.9   Cardiac Enzymes: No results for input(s): CKTOTAL, CKMB, CKMBINDEX, TROPONINI in the last 168 hours. BNP (last 3 results) No results for input(s): PROBNP in the last 8760  hours. HbA1C: Recent Labs    08/07/2019 1405  HGBA1C 7.0*   CBG: Recent Labs  Lab 08/28/19 0753 08/28/19 1143 08/28/19 1644 08/28/19 2106 08/29/19 0801  GLUCAP 133* 214* 212* 315* 242*   Lipid Profile: No results for input(s): CHOL, HDL, LDLCALC, TRIG, CHOLHDL, LDLDIRECT in the last 72 hours. Thyroid Function Tests: No results for input(s): TSH, T4TOTAL, FREET4, T3FREE, THYROIDAB in the last 72 hours. Anemia Panel: No results for input(s): VITAMINB12, FOLATE, FERRITIN, TIBC, IRON, RETICCTPCT in the last 72 hours. Sepsis Labs: Recent Labs  Lab 08/22/2019 1405 08/18/2019 1812  LATICACIDVEN 2.4* 2.0*    Recent Results (from the past 240 hour(s))  Blood culture (routine x 2)     Status: None (Preliminary result)   Collection Time: 07/31/2019  2:05 PM   Specimen: BLOOD  Result Value Ref Range Status   Specimen Description   Final    BLOOD LEFT ANTECUBITAL Performed at [facility] [address] [place], [place] [address]    Special Requests   Final    BOTTLES DRAWN AEROBIC AND ANAEROBIC Blood Culture adequate volume Performed at [facility] [address] [place], [place] [address]    Culture   Final    NO GROWTH 2 DAYS Performed at [facility] [address], [place], [place] [address]  Report Status PENDING  Incomplete  Respiratory Panel by RT PCR (Flu A&B, Covid) - Nasopharyngeal Swab     Status: None   Collection Time: 08/25/2019  2:18 PM   Specimen: Nasopharyngeal Swab  Result Value Ref Range Status   SARS Coronavirus 2 by RT PCR NEGATIVE NEGATIVE Final    Comment: (NOTE) SARS-CoV-2 target nucleic acids are NOT DETECTED. The SARS-CoV-2 RNA is generally detectable in upper respiratoy specimens during the acute phase of infection. The lowest concentration of SARS-CoV-2 viral copies this assay can detect is 131 copies/mL. A negative result does not preclude SARS-Cov-2 infection and should not be used as the sole  basis for treatment or other patient management decisions. A negative result may occur with  improper specimen collection/handling, submission of specimen other than nasopharyngeal swab, presence of viral mutation(s) within the areas targeted by this assay, and inadequate number of viral copies (<131 copies/mL). A negative result must be combined with clinical observations, patient history, and epidemiological information. The expected result is Negative. Fact Sheet for Patients:  PinkCheek.be Fact Sheet for Healthcare Providers:  GravelBags.it This test is not yet ap proved or cleared by the [place] FDA and  has been authorized for detection and/or diagnosis of SARS-CoV-2 by FDA under an Emergency Use Authorization (EUA). This EUA will remain  in effect (meaning this test can be used) for the duration of the COVID-19 declaration under Section 564(b)(1) of the Act, 21 U.S.C. section 360bbb-3(b)(1), unless the authorization is terminated or revoked sooner.    Influenza A by PCR NEGATIVE NEGATIVE Final   Influenza B by PCR NEGATIVE NEGATIVE Final    Comment: (NOTE) The Xpert Xpress SARS-CoV-2/FLU/RSV assay is intended as an aid in  the diagnosis of influenza from Nasopharyngeal swab specimens and  should not be used as a sole basis for treatment. Nasal washings and  aspirates are unacceptable for Xpert Xpress SARS-CoV-2/FLU/RSV  testing. Fact Sheet for Patients: PinkCheek.be Fact Sheet for Healthcare Providers: GravelBags.it This test is not yet approved or cleared by the [place] FDA and  has been authorized for detection and/or diagnosis of SARS-CoV-2 by  FDA under an Emergency Use Authorization (EUA). This EUA will remain  in effect (meaning this test can be used) for the duration of the  Covid-19 declaration under Section 564(b)(1) of the Act, 21  U.S.C.  section 360bbb-3(b)(1), unless the authorization is  terminated or revoked. Performed at [facility], [place] [address], [place], [place] [address]   Blood culture (routine x 2)     Status: None (Preliminary result)   Collection Time: 08/27/2019  2:37 PM   Specimen: BLOOD  Result Value Ref Range Status   Specimen Description   Final    BLOOD RIGHT WRIST Performed at [facility] [address] [place], [place] [address]    Special Requests   Final    BOTTLES DRAWN AEROBIC ONLY Blood Culture results may not be optimal due to an inadequate volume of blood received in culture bottles Performed at [facility] [address], [place], [place] [address]    Culture   Final    NO GROWTH 2 DAYS Performed at [facility] [address], [place], [place] [address]    Report Status PENDING  Incomplete  Urine culture     Status: None   Collection Time: 08/21/2019  3:19 PM   Specimen: Urine, Catheterized  Result Value Ref Range Status   Specimen Description  Final    URINE, CATHETERIZED Performed at [facility], [place] [address], [place], [place] [address]    Special Requests   Final    NONE Performed at [facility], [place] [address] [place], [place] [address]    Culture   Final    NO GROWTH Performed at [facility], [place] [address] [place], [place] [address]    Report Status 08/27/2019 FINAL  Final  MRSA PCR Screening     Status: None   Collection Time: 08/27/19  1:46 AM   Specimen: Nasal Mucosa; Nasopharyngeal  Result Value Ref Range Status   MRSA by PCR NEGATIVE NEGATIVE Final    Comment:        The GeneXpert MRSA Assay (FDA approved for NASAL specimens only), is one component of a comprehensive MRSA colonization surveillance program. It is not intended to diagnose MRSA infection nor to guide or monitor treatment for MRSA infections. Performed at [facility], [place] [address], [place], [place] [address]          Radiology Studies: MR FOOT RIGHT WO CONTRAST  Result Date: 08/28/2019 CLINICAL DATA:  Foot pain and swelling. Diabetic. EXAM: MRI OF THE RIGHT FOREFOOT WITHOUT CONTRAST TECHNIQUE: Multiplanar, multisequence MR imaging of the right foot was performed. No intravenous contrast was administered. COMPARISON:  Radiographs 08/27/2019 FINDINGS: Bones/Joint/Cartilage Remote healed second, third and fourth metatarsal neck fractures. No acute fracture is identified. No destructive bony changes to suggest osteomyelitis. No MR findings suspicious for septic arthritis. Mild subcutaneous soft tissue swelling/edema/fluid suggesting cellulitis. No focal soft tissue abscess is identified. Mild edema like changes involving the forefoot musculature suggesting myositis. No MR findings to suggest pyomyositis. The major tendons and ligaments appear intact. The plantar fascia is intact. IMPRESSION: 1. Cellulitis and mild myositis but no focal soft tissue abscess or pyomyositis. 2. Remote healed second, third and fourth metatarsal neck fractures. 3. No MR findings to suggest septic arthritis or osteomyelitis. Electronically Signed   By: Marijo Sanes M.D.   On: 08/28/2019 05:01   MR FOOT LEFT WO CONTRAST  Result Date: 08/28/2019 CLINICAL DATA:  Pain and swelling. Diabetic. EXAM: MRI OF THE LEFT FOOT WITHOUT CONTRAST TECHNIQUE: Multiplanar, multisequence MR imaging of the left foot was performed. No intravenous contrast was administered. COMPARISON:  Radiographs 08/09/2019 FINDINGS: Diffuse subcutaneous soft tissue swelling/edema/fluid suggesting cellulitis. No discrete soft tissue abscess. There is also mild myositis but no findings for pyomyositis. Mild forefoot and moderate midfoot degenerative changes most notable at the first and second tarsal metatarsal joints with joint space narrowing, joint effusions and subchondral cystic changes. No fracture or findings  for osteomyelitis. No findings suspicious for septic arthritis. There is a fragmented appearance of the medial sesamoid of the great toe which could be remote posttraumatic changes or developmental. I do not see any significant signal abnormality to suggest osteomyelitis. The major tendons and ligaments appear intact. The plantar fascia is intact. Changes of plantar fasciitis are noted. IMPRESSION: 1. Diffuse subcutaneous soft tissue swelling/edema/fluid suggesting cellulitis. No discrete soft tissue abscess. 2. Mild myositis but no findings for pyomyositis. 3. Mild forefoot and moderate midfoot degenerative changes but no findings for septic arthritis or osteomyelitis. 4. Remote posttraumatic or developmental changes involving the medial sesamoid of the great toe. Electronically Signed   By: Marijo Sanes M.D.   On: 08/28/2019 05:08   DG CHEST PORT 1 VIEW  Result Date: 08/28/2019 CLINICAL DATA:  Tachypnea and wheezing. EXAM: PORTABLE CHEST 1  VIEW COMPARISON:  Radiograph 2 days ago 08/10/2019 FINDINGS: Patient is rotated. Mild cardiomegaly, unchanged. Atherosclerosis of the aortic arch. Pulmonary vasculature is normal. No consolidation, pleural effusion, or pneumothorax. No acute osseous abnormalities are seen. Chronic changes of the right shoulder. IMPRESSION: Stable mild cardiomegaly without acute abnormality. Electronically Signed   By: Keith Rake M.D.   On: 08/28/2019 12:18   DG Foot Complete Right  Result Date: 08/27/2019 CLINICAL DATA:  Nonresponsive.  Foot pain. EXAM: RIGHT FOOT COMPLETE - 3+ VIEW COMPARISON:  02/24/2016 FINDINGS: Interval healing of the third and fourth metatarsal neck fractures. Remote bimalleolar fracture and repair. Hammertoe deformity on the lateral view. No acute fracture or malalignment. IMPRESSION: No acute finding. Electronically Signed   By: Monte Fantasia M.D.   On: 08/27/2019 10:50   DG Swallowing Func-Speech Pathology  Result Date: 08/28/2019 Objective Swallowing  Evaluation: Type of Study: MBS-Modified Barium Swallow Study  Patient Details Name: Natalie Rodgers MRN: [id number] Date of Birth: 21-May-1941 Today's Date: 08/28/2019 Time: SLP Start Time (ACUTE ONLY): 1700 -SLP Stop Time (ACUTE ONLY): 1716 SLP Time Calculation (min) (ACUTE ONLY): 16 min Past Medical History: Past Medical History: Diagnosis Date . Chronic kidney disease   CKD Stage IV . Dementia ([place])  . Diabetes mellitus without complication ([place])  . GERD (gastroesophageal reflux disease)  . Hypertension  Past Surgical History: Past Surgical History: Procedure Laterality Date . ABDOMINAL HYSTERECTOMY    salpinoophorectomy . LAPAROTOMY   HPI: Pt is 79 yo female with PMH including HTN, DM2, CKD, mild dementia, anemia, oncology care, hx of ICH with R hemiparesis 2016. Pt presented to ED with complaint of leg and foot pain.  Imaging of feet - negative for acute fx.  Pt admitted with SIRS (no clear source per note) and afib with RVR.    Swallow evaluation ordered due to pt overtly coughing with water today.  RN reports pt tolerated po medications well.  Pt was tachycardic and febrile upon admission.  Urinalysis was negative as well CXR.  Pt resides at [facility].  Per MD note, pt follows some directions and is disoriented to location.  Pt denies difficulty with swallowing.  Subjective: pt sitting upright in swallow function chair Assessment / Plan / Recommendation CHL IP CLINICAL IMPRESSIONS 08/28/2019 Clinical Impression Pt with mild oral and functional pharyngeal swallow without aspiration of any consistency tested.  Minimal laryngeal penetration with thin noted- Swallow was strong and timely without pharyngeal retention.  Her respiratory and swallowing reciprocity was intact.  Piecemealing with solids functional.  Of note, pt appeared to swallow multiple times with each bolus *when flouro was not turned on - SLP questions if this is simply behavioral.  Recommend advance diet to dys3/thin, medicine with puree and small  frequent meals due to pt's dyspnea with intake and to accommodate pt's known esophageal reflux. Will follow briefly given pt's mild oral deficits/discoordination that may contribute to aspiration of prematurely spilled boluses into larynx.  Of note, daughter Verdis Frederickson was educated to finding when SLP posted swallow precaution sign. SLP Visit Diagnosis Dysphagia, oral phase (R13.11) Attention and concentration deficit following -- Frontal lobe and executive function deficit following -- Impact on safety and function Mild aspiration risk   CHL IP TREATMENT RECOMMENDATION 08/28/2019 Treatment Recommendations Therapy as outlined in treatment plan below   Prognosis 08/28/2019 Prognosis for Safe Diet Advancement Good Barriers to Reach Goals Cognitive deficits Barriers/Prognosis Comment -- CHL IP DIET RECOMMENDATION 08/28/2019 SLP Diet Recommendations Dysphagia 3 (Mech soft) solids;Thin liquid Liquid Administration via Cup;Straw  Medication Administration Whole meds with puree Compensations Slow rate;Small sips/bites Postural Changes --   No flowsheet data found.  CHL IP FOLLOW UP RECOMMENDATIONS 08/28/2019 Follow up Recommendations None   CHL IP FREQUENCY AND DURATION 08/28/2019 Speech Therapy Frequency (ACUTE ONLY) min 1 x/week Treatment Duration 1 week      CHL IP ORAL PHASE 08/28/2019 Oral Phase Impaired Oral - Pudding Teaspoon -- Oral - Pudding Cup -- Oral - Honey Teaspoon -- Oral - Honey Cup -- Oral - Nectar Teaspoon -- Oral - Nectar Cup Premature spillage;Decreased bolus cohesion Oral - Nectar Straw -- Oral - Thin Teaspoon -- Oral - Thin Cup Premature spillage;Decreased bolus cohesion Oral - Thin Straw Premature spillage;Decreased bolus cohesion Oral - Puree WFL Oral - Mech Soft WFL Oral - Regular -- Oral - Multi-Consistency -- Oral - Pill WFL Oral Phase - Comment --  CHL IP PHARYNGEAL PHASE 08/28/2019 Pharyngeal Phase Impaired Pharyngeal- Pudding Teaspoon -- Pharyngeal -- Pharyngeal- Pudding Cup -- Pharyngeal -- Pharyngeal- Honey  Teaspoon -- Pharyngeal -- Pharyngeal- Honey Cup -- Pharyngeal -- Pharyngeal- Nectar Teaspoon -- Pharyngeal -- Pharyngeal- Nectar Cup [facility] Pharyngeal Material does not enter airway Pharyngeal- Nectar Straw WFL Pharyngeal Material does not enter airway Pharyngeal- Thin Teaspoon -- Pharyngeal -- Pharyngeal- Thin Cup WFL;Penetration/Aspiration during swallow Pharyngeal Material enters airway, remains ABOVE vocal cords then ejected out Pharyngeal- Thin Straw WFL Pharyngeal Material does not enter airway Pharyngeal- Puree WFL Pharyngeal Material does not enter airway Pharyngeal- Mechanical Soft WFL Pharyngeal Material does not enter airway Pharyngeal- Regular -- Pharyngeal -- Pharyngeal- Multi-consistency -- Pharyngeal -- Pharyngeal- Pill WFL Pharyngeal Material does not enter airway Pharyngeal Comment --  CHL IP CERVICAL ESOPHAGEAL PHASE 08/28/2019 Cervical Esophageal Phase -- Pudding Teaspoon -- Pudding Cup -- Honey Teaspoon -- Honey Cup -- Nectar Teaspoon -- Nectar Cup -- Nectar Straw -- Thin Teaspoon -- Thin Cup -- Thin Straw -- Puree -- Mechanical Soft -- Regular -- Multi-consistency -- Pill -- Cervical Esophageal Comment Upon esophageal sweep, pt appears with trace residuals, radiologist not present to confirm. Kathleen Lime, MS [facility] SLP Acute Rehab Services Office [phone] Macario Golds 08/28/2019, 10:55 AM                   Scheduled Meds: . aspirin EC  81 mg Oral Daily  . Chlorhexidine Gluconate Cloth  6 each Topical Daily  . diphenhydrAMINE  50 mg Intravenous Q6H  . donepezil  10 mg Oral QHS  . ezetimibe  10 mg Oral Daily  . fenofibrate  160 mg Oral Daily  . insulin aspart  0-5 Units Subcutaneous QHS  . insulin aspart  0-9 Units Subcutaneous TID WC  . pravastatin  80 mg Oral QHS  . QUEtiapine  25 mg Oral Daily  . QUEtiapine  50 mg Oral QHS  . senna  1 tablet Oral BID  . sodium chloride flush  3 mL Intravenous Q12H   Continuous Infusions: . ceFEPime (MAXIPIME) IV Stopped (08/28/19 1458)  .  glucagon (GLUCAGEN) infusion (for beta blocker/calcium channel blocker overdose)       LOS: 3 days  CRITICAL CARE Performed by: Cordelia Poche, MD   Total critical care time: 50 minutes  Critical care time was exclusive of separately billable procedures and treating other patients.  Critical care was necessary to treat or prevent imminent or life-threatening deterioration.  Critical care was time spent personally by me on the following activities: development of treatment plan with patient and/or surrogate as well as nursing, discussions with consultants,  evaluation of patient's response to treatment, examination of patient, obtaining history from patient or surrogate, ordering and performing treatments and interventions, ordering and review of laboratory studies, ordering and review of radiographic studies, pulse oximetry and re-evaluation of patient's condition.   Cordelia Poche, MD [facility] 08/29/2019, 8:42 AM  If 7PM-7AM, please contact night-coverage [email or web address]

## 2019-08-30 ENCOUNTER — Inpatient Hospital Stay (HOSPITAL_COMMUNITY): Payer: Medicare Other

## 2019-08-30 DIAGNOSIS — I639 Cerebral infarction, unspecified: Secondary | ICD-10-CM | POA: Diagnosis not present

## 2019-08-30 DIAGNOSIS — R6510 Systemic inflammatory response syndrome (SIRS) of non-infectious origin without acute organ dysfunction: Secondary | ICD-10-CM | POA: Diagnosis not present

## 2019-08-30 DIAGNOSIS — N179 Acute kidney failure, unspecified: Secondary | ICD-10-CM | POA: Diagnosis not present

## 2019-08-30 DIAGNOSIS — I4891 Unspecified atrial fibrillation: Secondary | ICD-10-CM | POA: Diagnosis not present

## 2019-08-30 DIAGNOSIS — Z20822 Contact with and (suspected) exposure to covid-19: Secondary | ICD-10-CM | POA: Diagnosis not present

## 2019-08-30 DIAGNOSIS — R52 Pain, unspecified: Secondary | ICD-10-CM | POA: Diagnosis not present

## 2019-08-30 DIAGNOSIS — I480 Paroxysmal atrial fibrillation: Secondary | ICD-10-CM | POA: Diagnosis not present

## 2019-08-30 DIAGNOSIS — A419 Sepsis, unspecified organism: Secondary | ICD-10-CM | POA: Diagnosis not present

## 2019-08-30 DIAGNOSIS — K2101 Gastro-esophageal reflux disease with esophagitis, with bleeding: Secondary | ICD-10-CM | POA: Diagnosis not present

## 2019-08-30 DIAGNOSIS — M79671 Pain in right foot: Secondary | ICD-10-CM | POA: Diagnosis not present

## 2019-08-30 LAB — TROPONIN I (HIGH SENSITIVITY)
Troponin I (High Sensitivity): 14 ng/L (ref <18)
Troponin I (High Sensitivity): 15 ng/L (ref <18)

## 2019-08-30 LAB — CBC
HCT: 31.6 % — ABNORMAL LOW (ref 36.0–46.0)
Hemoglobin: 10 g/dL — ABNORMAL LOW (ref 12.0–15.0)
MCH: 30.1 pg (ref 26.0–34.0)
MCHC: 31.6 g/dL (ref 30.0–36.0)
MCV: 95.2 fL (ref 80.0–100.0)
Platelets: 301 10*3/uL (ref 150–400)
RBC: 3.32 MIL/uL — ABNORMAL LOW (ref 3.87–5.11)
RDW: 14.3 % (ref 11.5–15.5)
WBC: 16.6 10*3/uL — ABNORMAL HIGH (ref 4.0–10.5)
nRBC: 0 % (ref 0.0–0.2)

## 2019-08-30 LAB — BASIC METABOLIC PANEL
Anion gap: 10 (ref 5–15)
BUN: 51 mg/dL — ABNORMAL HIGH (ref 8–23)
CO2: 20 mmol/L — ABNORMAL LOW (ref 22–32)
Calcium: 8.5 mg/dL — ABNORMAL LOW (ref 8.9–10.3)
Chloride: 112 mmol/L — ABNORMAL HIGH (ref 98–111)
Creatinine, Ser: 1.87 mg/dL — ABNORMAL HIGH (ref 0.44–1.00)
GFR calc Af Amer: 29 mL/min — ABNORMAL LOW (ref >60)
GFR calc non Af Amer: 25 mL/min — ABNORMAL LOW (ref >60)
Glucose, Bld: 258 mg/dL — ABNORMAL HIGH (ref 70–99)
Potassium: 3.8 mmol/L (ref 3.5–5.1)
Sodium: 142 mmol/L (ref 135–145)

## 2019-08-30 LAB — GLUCOSE, CAPILLARY
Glucose-Capillary: 210 mg/dL — ABNORMAL HIGH (ref 70–99)
Glucose-Capillary: 179 mg/dL — ABNORMAL HIGH (ref 70–99)
Glucose-Capillary: 131 mg/dL — ABNORMAL HIGH (ref 70–99)
Glucose-Capillary: 183 mg/dL — ABNORMAL HIGH (ref 70–99)

## 2019-08-30 LAB — ECHOCARDIOGRAM COMPLETE
Height: 60 in
Weight: 2391.55 oz

## 2019-08-30 LAB — TSH: TSH: 1.718 u[IU]/mL (ref 0.350–4.500)

## 2019-08-30 LAB — MAGNESIUM: Magnesium: 2 mg/dL (ref 1.7–2.4)

## 2019-08-30 MED ORDER — DILTIAZEM HCL 25 MG/5ML IV SOLN
10.0000 mg | Freq: Once | INTRAVENOUS | Status: AC
  Administered 2019-08-30: 10 mg via INTRAVENOUS
  Filled 2019-08-30: qty 5

## 2019-08-30 MED ORDER — HYDRALAZINE HCL 25 MG PO TABS
25.0000 mg | ORAL_TABLET | Freq: Four times a day (QID) | ORAL | Status: DC | PRN
  Administered 2019-08-30: 25 mg via ORAL
  Filled 2019-08-30: qty 1

## 2019-08-30 MED ORDER — CARVEDILOL 25 MG PO TABS
25.0000 mg | ORAL_TABLET | Freq: Two times a day (BID) | ORAL | Status: DC
  Administered 2019-08-30 – 2019-09-03 (×9): 25 mg via ORAL
  Filled 2019-08-30 (×10): qty 1

## 2019-08-30 MED ORDER — CHLORTHALIDONE 25 MG PO TABS
12.5000 mg | ORAL_TABLET | Freq: Every day | ORAL | Status: DC
  Administered 2019-08-30 – 2019-09-02 (×4): 12.5 mg via ORAL
  Filled 2019-08-30 (×5): qty 0.5

## 2019-08-30 MED ORDER — BENAZEPRIL HCL 20 MG PO TABS
20.0000 mg | ORAL_TABLET | Freq: Every day | ORAL | Status: DC
  Administered 2019-08-30 – 2019-09-03 (×5): 20 mg via ORAL
  Filled 2019-08-30 (×6): qty 1

## 2019-08-30 MED ORDER — LABETALOL HCL 5 MG/ML IV SOLN
10.0000 mg | INTRAVENOUS | Status: DC | PRN
  Administered 2019-08-30: 10 mg via INTRAVENOUS
  Filled 2019-08-30 (×2): qty 4

## 2019-08-30 NOTE — Progress Notes (Signed)
PROGRESS NOTE    Natalie Rodgers  MLY:[id number] DOB: September 12, 1940 DOA: 08/25/2019 PCP: Welford Roche, NP   Brief Narrative: Natalie Rodgers is a 79 y.o. female with PMH significant for HTN, DM2 not on insulin, CKD, dementia, chronic anemia. Patient presented secondary to right leg pain. She resides at [facility]. She was found to be febrile on admission with associated evidence concerning for SIRS with unknown source. She was started on empiric antibiotics. She also developed atrial fibrillation with RVR and started on Diltiazem IV.   Assessment & Plan:   Principal Problem:   SIRS (systemic inflammatory response syndrome) (HCC) Active Problems:   Atrial fibrillation with RVR (HCC)   AKI (acute kidney injury) ([place])   Type 2 diabetes mellitus with hyperlipidemia (HCC)   Essential hypertension   Dementia without behavioral disturbance (HCC)   Bilateral foot pain   Sepsis Source of bilateral foot cellulitis and myositis. Present on admission. Does not meet sepsis criteria. SIRS criteria met on admission. Urine culture with no growth (final); blood cultures with no growth to date. COVID-19 and influenza negative. Chest x-ray without acute abnormalities. Left foot x-ray with possible evidence of infection. Empirically started on Vancomycin and Cefepime. Now on Cefepime. Blood cultures no growth to date.  Atrial fibrillation with RVR Initially started on Diltiazem drip which is now discontinued. Currently in sinus rhythm. On Coreg and aspirin as an outpatient. CHA2DS2-VASc Score is 7. Patient with a history of intracranial hemorrhage. -Continue Coreg -Transthoracic Echocardiogram today  Anaphylactic shock Unknown etiology. Possibly related to barium vs food ingestion. Patient had stridor and hypotension requiring racemic epinephrine, solu-medrol, Pepcid, benadryl. Patient required norepinephrine for blood pressure support which is now discontinued. Barium added as allergy to patient's  chart.  Foot cellulitis MRI significant for cellulitis with mild myositis of both feet. No external evidence noted. -Continue Tylenol prn -Cefepime  Leukocytosis Secondary to infection initially and trended down. Now elevated in setting of steroid use. Doubt related to infection. -CBC in AM  Mild right-sided rales Possible aspiration. Clear chest x-ray on admission. Repeat chest x-ray without evidence of infiltrate. Resolved.  CKD stage IV Creatinine of 2.02 on admission. Baseline creatinine of 2.2 per chart review on linked chart and review of outside records. Patient does not have acute kidney injury but stable CKD.  Diabetes mellitus, type 2 Hemoglobin A1C of 7%. Patient is on glipizide. -Continue SSI  Essential hypertension Uncontrolled in setting of holding antihypertensives from shock. -Continue BiDil and amlodipine -Restart home benazepril and chlorthalidone  Hyperlipidemia -Continue Zetia, pravastatin, fenofibrate  Dementia -Continue Aricept  Abdominal distension Passing gas. -Bowel movement needed  Dysphagia SLP consulted and barium swallow performed 4/1.     SLP Diet Recommendations: Dysphagia 3 (Mech soft) solids;Thin liquid  Liquid Administration via: Cup;Straw Medication Administration: Whole meds with puree Supervision: Patient able to self feed---  Pt must feed self for appropriate proprioception Compensations: Slow rate;Small sips/bites   DVT prophylaxis: SCDs Code Status:   Code Status: Full Code Family Communication: Daughter at bedside Disposition Plan: Transfer to telemetry. Discharge pending management of cellulitis/myositis with transition to oral antibiotics. Also needs to have BP meds titrated for a more stable BP   Consultants:   CCM  Procedures:   None  Antimicrobials:  Vancomycin  Cefepime    Subjective: No chest pain or dyspnea. Some right foot pain.  Objective: Vitals:   08/30/19 0500 08/30/19 0600 08/30/19 0700  08/30/19 0800  BP: (!) 142/104 (!) 161/73 (!) 167/79 (!) 176/102  Pulse: 95  86 88 93  Resp: _0 Temp:      TempSrc:      SpO2: 100% 99% 98% 100%  Weight:      Height:        Intake/Output Summary (Last 24 hours) at 08/30/2019 0825 Last data filed at 08/30/2019 0600 Gross per 24 hour  Intake 1172.87 ml  Output 1100 ml  Net 72.87 ml   Filed Weights   08/15/2019 1537 08/27/19 0135  Weight: 83.9 kg 67.8 kg    Examination:  General exam: Appears calm and comfortable Respiratory system: Clear to auscultation. Respiratory effort normal. Cardiovascular system: S1 & S2 heard, RRR. No murmurs, rubs, gallops or clicks. Gastrointestinal system: Abdomen is distended, soft and nontender. No organomegaly or masses felt. Normal bowel sounds heard. Central nervous system: Alert. Extremities: No edema. No calf tenderness. Some mild tenderness of dorsum of right lateral foot Skin: No cyanosis. No rashes Psychiatry: Judgement and insight appear impaired. Mood & affect appropriate.     Data Reviewed: I have personally reviewed following labs and imaging studies  CBC: Recent Labs  Lab 08/23/2019 1405 08/27/19 0256 08/28/19 0237 08/29/19 0301 08/30/19 0300  WBC 14.9* 13.3* 11.3* 11.9* 16.6*  NEUTROABS 13.0*  --   --   --   --   HGB 12.5 10.7* 9.3* 10.1* 10.0*  HCT 40.0 34.5* 30.4* 32.2* 31.6*  MCV 95.2 98.0 97.7 96.7 95.2  PLT 300 223 195 230 856   Basic Metabolic Panel: Recent Labs  Lab 07/28/2019 1405 08/27/19 0256 08/28/19 0237 08/29/19 0301 08/30/19 0300  NA 142 139 140 140 142  K 4.8 4.1 3.6 3.9 3.8  CL 105 110 114* 113* 112*  CO2 23 19* 18* 18* 20*  GLUCOSE 186* 188* 160* 322* 258*  BUN 36* 30* 33* 40* 51*  CREATININE 2.02* 1.74* 1.83* 1.97* 1.87*  CALCIUM 9.3 7.9* 7.4* 7.9* 8.5*   GFR: Estimated Creatinine Clearance: 20.6 mL/min (A) (by C-G formula based on SCr of 1.87 mg/dL (H)). Liver Function Tests: Recent Labs  Lab 07/28/2019 1405  AST 51*  ALT 42    ALKPHOS 60  BILITOT 0.9  PROT 8.6*  ALBUMIN 4.7   No results for input(s): LIPASE, AMYLASE in the last 168 hours. No results for input(s): AMMONIA in the last 168 hours. Coagulation Profile: Recent Labs  Lab 08/10/2019 1405  INR 0.9   Cardiac Enzymes: No results for input(s): CKTOTAL, CKMB, CKMBINDEX, TROPONINI in the last 168 hours. BNP (last 3 results) No results for input(s): PROBNP in the last 8760 hours. HbA1C: No results for input(s): HGBA1C in the last 72 hours. CBG: Recent Labs  Lab 08/29/19 0801 08/29/19 1206 08/29/19 1625 08/29/19 2129 08/30/19 0814  GLUCAP 242* 263* 268* 287* 210*   Lipid Profile: No results for input(s): CHOL, HDL, LDLCALC, TRIG, CHOLHDL, LDLDIRECT in the last 72 hours. Thyroid Function Tests: No results for input(s): TSH, T4TOTAL, FREET4, T3FREE, THYROIDAB in the last 72 hours. Anemia Panel: No results for input(s): VITAMINB12, FOLATE, FERRITIN, TIBC, IRON, RETICCTPCT in the last 72 hours. Sepsis Labs: Recent Labs  Lab 08/08/2019 1405 08/02/2019 1812  LATICACIDVEN 2.4* 2.0*    Recent Results (from the past 240 hour(s))  Blood culture (routine x 2)     Status: None (Preliminary result)   Collection Time: 08/22/2019  2:05 PM   Specimen: BLOOD  Result Value Ref Range Status   Specimen Description   Final    BLOOD LEFT ANTECUBITAL Performed at [facility], [place] [address] [place], [place] [address]    Special Requests   Final    BOTTLES DRAWN AEROBIC AND ANAEROBIC Blood Culture adequate volume Performed at [place] [address], [place], [place] [address]    Culture   Final    NO GROWTH 3 DAYS Performed at [facility], [place] [address] [place], [place] [address]    Report Status PENDING  Incomplete  Respiratory Panel by RT PCR (Flu A&B, Covid) - Nasopharyngeal Swab     Status: None   Collection Time: 08/01/2019  2:18 PM   Specimen: Nasopharyngeal Swab  Result Value Ref Range Status    SARS Coronavirus 2 by RT PCR NEGATIVE NEGATIVE Final    Comment: (NOTE) SARS-CoV-2 target nucleic acids are NOT DETECTED. The SARS-CoV-2 RNA is generally detectable in upper respiratoy specimens during the acute phase of infection. The lowest concentration of SARS-CoV-2 viral copies this assay can detect is 131 copies/mL. A negative result does not preclude SARS-Cov-2 infection and should not be used as the sole basis for treatment or other patient management decisions. A negative result may occur with  improper specimen collection/handling, submission of specimen other than nasopharyngeal swab, presence of viral mutation(s) within the areas targeted by this assay, and inadequate number of viral copies (<131 copies/mL). A negative result must be combined with clinical observations, patient history, and epidemiological information. The expected result is Negative. Fact Sheet for Patients:  PinkCheek.be Fact Sheet for Healthcare Providers:  GravelBags.it This test is not yet ap proved or cleared by the [place] FDA and  has been authorized for detection and/or diagnosis of SARS-CoV-2 by FDA under an Emergency Use Authorization (EUA). This EUA will remain  in effect (meaning this test can be used) for the duration of the COVID-19 declaration under Section 564(b)(1) of the Act, 21 U.S.C. section 360bbb-3(b)(1), unless the authorization is terminated or revoked sooner.    Influenza A by PCR NEGATIVE NEGATIVE Final   Influenza B by PCR NEGATIVE NEGATIVE Final    Comment: (NOTE) The Xpert Xpress SARS-CoV-2/FLU/RSV assay is intended as an aid in  the diagnosis of influenza from Nasopharyngeal swab specimens and  should not be used as a sole basis for treatment. Nasal washings and  aspirates are unacceptable for Xpert Xpress SARS-CoV-2/FLU/RSV  testing. Fact Sheet for Patients: PinkCheek.be Fact  Sheet for Healthcare Providers: GravelBags.it This test is not yet approved or cleared by the [place] FDA and  has been authorized for detection and/or diagnosis of SARS-CoV-2 by  FDA under an Emergency Use Authorization (EUA). This EUA will remain  in effect (meaning this test can be used) for the duration of the  Covid-19 declaration under Section 564(b)(1) of the Act, 21  U.S.C. section 360bbb-3(b)(1), unless the authorization is  terminated or revoked. Performed at [facility], [place] [address], [place], [place] [address]   Blood culture (routine x 2)     Status: None (Preliminary result)   Collection Time: 07/28/2019  2:37 PM   Specimen: BLOOD  Result Value Ref Range Status   Specimen Description   Final    BLOOD RIGHT WRIST Performed at [place] [address] [place],  [address]    Special Requests   Final    BOTTLES DRAWN AEROBIC ONLY Blood Culture results may not be optimal due to an inadequate volume of blood received in culture bottles Performed at [place] [address] [place],  [place] [address]    Culture   Final    NO GROWTH 3 DAYS Performed at [facility], [place] [address] [place], [place] [address]    Report Status PENDING  Incomplete  Urine culture     Status: None   Collection Time: 08/15/2019  3:19 PM   Specimen: Urine, Catheterized  Result Value Ref Range Status   Specimen Description   Final    URINE, CATHETERIZED Performed at [place] [address], [place], [place] [address]    Special Requests   Final    NONE Performed at [facility], [place] [address] [place], [place] [address]    Culture   Final    NO GROWTH Performed at [facility], [place] [address] [place], [place] [address]    Report Status 08/27/2019 FINAL  Final  MRSA PCR Screening     Status: None   Collection Time: 08/27/19   1:46 AM   Specimen: Nasal Mucosa; Nasopharyngeal  Result Value Ref Range Status   MRSA by PCR NEGATIVE NEGATIVE Final    Comment:        The GeneXpert MRSA Assay (FDA approved for NASAL specimens only), is one component of a comprehensive MRSA colonization surveillance program. It is not intended to diagnose MRSA infection nor to guide or monitor treatment for MRSA infections. Performed at [facility], [place] [address], [place], [place] [address]          Radiology Studies: DG CHEST PORT 1 VIEW  Result Date: 08/28/2019 CLINICAL DATA:  Tachypnea and wheezing. EXAM: PORTABLE CHEST 1 VIEW COMPARISON:  Radiograph 2 days ago 08/27/2019 FINDINGS: Patient is rotated. Mild cardiomegaly, unchanged. Atherosclerosis of the aortic arch. Pulmonary vasculature is normal. No consolidation, pleural effusion, or pneumothorax. No acute osseous abnormalities are seen. Chronic changes of the right shoulder. IMPRESSION: Stable mild cardiomegaly without acute abnormality. Electronically Signed   By: Keith Rake M.D.   On: 08/28/2019 12:18   DG Swallowing Func-Speech Pathology  Result Date: 08/28/2019 Objective Swallowing Evaluation: Type of Study: MBS-Modified Barium Swallow Study  Patient Details Name: Lashone Stauber MRN: [id number] Date of Birth: 24-May-1941 Today's Date: 08/28/2019 Time: SLP Start Time (ACUTE ONLY): 1700 -SLP Stop Time (ACUTE ONLY): 1716 SLP Time Calculation (min) (ACUTE ONLY): 16 min Past Medical History: Past Medical History: Diagnosis Date . Chronic kidney disease   CKD Stage IV . Dementia ([place])  . Diabetes mellitus without complication ([place])  . GERD (gastroesophageal reflux disease)  . Hypertension  Past Surgical History: Past Surgical History: Procedure Laterality Date . ABDOMINAL HYSTERECTOMY    salpinoophorectomy . LAPAROTOMY   HPI: Pt is 79 yo female with PMH including HTN, DM2, CKD, mild dementia, anemia, oncology care, hx of ICH with R hemiparesis 2016. Pt  presented to ED with complaint of leg and foot pain.  Imaging of feet - negative for acute fx.  Pt admitted with SIRS (no clear source per note) and afib with RVR.    Swallow evaluation ordered due to pt overtly coughing with water today.  RN reports pt tolerated po medications well.  Pt was tachycardic and febrile upon admission.  Urinalysis was negative as well CXR.  Pt resides at [facility].  Per MD note, pt follows some directions and is disoriented to location.  Pt denies difficulty with swallowing.  Subjective: pt sitting upright in swallow function chair Assessment / Plan / Recommendation CHL IP CLINICAL IMPRESSIONS 08/28/2019 Clinical Impression Pt with mild oral  and functional pharyngeal swallow without aspiration of any consistency tested.  Minimal laryngeal penetration with thin noted- Swallow was strong and timely without pharyngeal retention.  Her respiratory and swallowing reciprocity was intact.  Piecemealing with solids functional.  Of note, pt appeared to swallow multiple times with each bolus *when flouro was not turned on - SLP questions if this is simply behavioral.  Recommend advance diet to dys3/thin, medicine with puree and small frequent meals due to pt's dyspnea with intake and to accommodate pt's known esophageal reflux. Will follow briefly given pt's mild oral deficits/discoordination that may contribute to aspiration of prematurely spilled boluses into larynx.  Of note, daughter Verdis Frederickson was educated to finding when SLP posted swallow precaution sign. SLP Visit Diagnosis Dysphagia, oral phase (R13.11) Attention and concentration deficit following -- Frontal lobe and executive function deficit following -- Impact on safety and function Mild aspiration risk   CHL IP TREATMENT RECOMMENDATION 08/28/2019 Treatment Recommendations Therapy as outlined in treatment plan below   Prognosis 08/28/2019 Prognosis for Safe Diet Advancement Good Barriers to Reach Goals Cognitive deficits Barriers/Prognosis  Comment -- CHL IP DIET RECOMMENDATION 08/28/2019 SLP Diet Recommendations Dysphagia 3 (Mech soft) solids;Thin liquid Liquid Administration via Cup;Straw Medication Administration Whole meds with puree Compensations Slow rate;Small sips/bites Postural Changes --   No flowsheet data found.  CHL IP FOLLOW UP RECOMMENDATIONS 08/28/2019 Follow up Recommendations None   CHL IP FREQUENCY AND DURATION 08/28/2019 Speech Therapy Frequency (ACUTE ONLY) min 1 x/week Treatment Duration 1 week      CHL IP ORAL PHASE 08/28/2019 Oral Phase Impaired Oral - Pudding Teaspoon -- Oral - Pudding Cup -- Oral - Honey Teaspoon -- Oral - Honey Cup -- Oral - Nectar Teaspoon -- Oral - Nectar Cup Premature spillage;Decreased bolus cohesion Oral - Nectar Straw -- Oral - Thin Teaspoon -- Oral - Thin Cup Premature spillage;Decreased bolus cohesion Oral - Thin Straw Premature spillage;Decreased bolus cohesion Oral - Puree WFL Oral - Mech Soft WFL Oral - Regular -- Oral - Multi-Consistency -- Oral - Pill WFL Oral Phase - Comment --  CHL IP PHARYNGEAL PHASE 08/28/2019 Pharyngeal Phase Impaired Pharyngeal- Pudding Teaspoon -- Pharyngeal -- Pharyngeal- Pudding Cup -- Pharyngeal -- Pharyngeal- Honey Teaspoon -- Pharyngeal -- Pharyngeal- Honey Cup -- Pharyngeal -- Pharyngeal- Nectar Teaspoon -- Pharyngeal -- Pharyngeal- Nectar Cup [facility] Pharyngeal Material does not enter airway Pharyngeal- Nectar Straw WFL Pharyngeal Material does not enter airway Pharyngeal- Thin Teaspoon -- Pharyngeal -- Pharyngeal- Thin Cup WFL;Penetration/Aspiration during swallow Pharyngeal Material enters airway, remains ABOVE vocal cords then ejected out Pharyngeal- Thin Straw WFL Pharyngeal Material does not enter airway Pharyngeal- Puree WFL Pharyngeal Material does not enter airway Pharyngeal- Mechanical Soft WFL Pharyngeal Material does not enter airway Pharyngeal- Regular -- Pharyngeal -- Pharyngeal- Multi-consistency -- Pharyngeal -- Pharyngeal- Pill WFL Pharyngeal Material does not  enter airway Pharyngeal Comment --  CHL IP CERVICAL ESOPHAGEAL PHASE 08/28/2019 Cervical Esophageal Phase -- Pudding Teaspoon -- Pudding Cup -- Honey Teaspoon -- Honey Cup -- Nectar Teaspoon -- Nectar Cup -- Nectar Straw -- Thin Teaspoon -- Thin Cup -- Thin Straw -- Puree -- Mechanical Soft -- Regular -- Multi-consistency -- Pill -- Cervical Esophageal Comment Upon esophageal sweep, pt appears with trace residuals, radiologist not present to confirm. Kathleen Lime, MS [facility] SLP Acute Rehab Services Office [phone] Macario Golds 08/28/2019, 10:55 AM                   Scheduled Meds: . amLODipine  10 mg Oral Daily  .  aspirin EC  81 mg Oral Daily  . Chlorhexidine Gluconate Cloth  6 each Topical Daily  . donepezil  10 mg Oral QHS  . ezetimibe  10 mg Oral Daily  . fenofibrate  160 mg Oral Daily  . insulin aspart  0-5 Units Subcutaneous QHS  . insulin aspart  0-9 Units Subcutaneous TID WC  . isosorbide-hydrALAZINE  1 tablet Oral TID  . melatonin  2 mg Oral QHS  . pravastatin  80 mg Oral QHS  . QUEtiapine  25 mg Oral Daily  . QUEtiapine  50 mg Oral QHS  . senna  1 tablet Oral BID  . sodium chloride flush  3 mL Intravenous Q12H   Continuous Infusions: . sodium chloride 10 mL/hr at 08/30/19 0400  . ceFEPime (MAXIPIME) IV Stopped (08/29/19 1544)     LOS: 4 days  CRITICAL CARE Performed by: Cordelia Poche, MD   Total critical care time: 50 minutes  Critical care time was exclusive of separately billable procedures and treating other patients.  Critical care was necessary to treat or prevent imminent or life-threatening deterioration.  Critical care was time spent personally by me on the following activities: development of treatment plan with patient and/or surrogate as well as nursing, discussions with consultants, evaluation of patient's response to treatment, examination of patient, obtaining history from patient or surrogate, ordering and performing treatments and interventions, ordering  and review of laboratory studies, ordering and review of radiographic studies, pulse oximetry and re-evaluation of patient's condition.   Cordelia Poche, MD [facility] 08/30/2019, 8:25 AM  If 7PM-7AM, please contact night-coverage [email or web address]

## 2019-08-30 NOTE — Progress Notes (Signed)
Order obtained from MD for stat EKG

## 2019-08-30 NOTE — Progress Notes (Signed)
  Echocardiogram 2D Echocardiogram has been performed.  Randa Lynn Tailynn Armetta 08/30/2019, 2:23 PM

## 2019-08-30 NOTE — Progress Notes (Signed)
Was sitting at the nursing station when I noticed patient was in A. fib with heart rate sustaining in the 170s. RN notified and EKG obtained. EKG consistent with A. fib with RVR with a rate of 169 with ST depression in leads V4 through V6. Per RN patient asymptomatic with no chest pain. Primary notified. Cycle cardiac enzymes x2, check a TSH. Cardizem 10 mg IV push x1 ordered. Patient noted to have been on Coreg which was subsequently discontinued this morning. Will resume Coreg at 25 mg twice daily as previously ordered. If no significant improvement with heart rate and A. fib continues to sustain patient will need to be placed on a Cardizem drip. Monitor.

## 2019-08-30 NOTE — Progress Notes (Signed)
   Vital Signs MEWS/VS Documentation      08/30/2019 1127 08/30/2019 1300 08/30/2019 1557 08/30/2019 1656   MEWS Score:  0  0  1  3  (Pended)    MEWS Score Color:  Green  Green  Green  Yellow  (Pended)    Resp:  18  --  20  --   Pulse:  94  --  (!) 105  --   BP:  (!) 143/78  --  (!) 148/85  --   Temp:  98 F (36.7 C)  --  98.1 F (36.7 C)  --   O2 Device:  --  --  Room Air  --   Level of Consciousness:  --  Alert  --  --       Patient is now yellow mews protocol due to heart rate.  MD aware.    Hinton Dyer  Daelyn Mozer 08/30/2019,5:23 PM

## 2019-08-31 DIAGNOSIS — I4891 Unspecified atrial fibrillation: Secondary | ICD-10-CM | POA: Diagnosis not present

## 2019-08-31 DIAGNOSIS — N179 Acute kidney failure, unspecified: Secondary | ICD-10-CM | POA: Diagnosis not present

## 2019-08-31 DIAGNOSIS — M79671 Pain in right foot: Secondary | ICD-10-CM | POA: Diagnosis not present

## 2019-08-31 DIAGNOSIS — R6510 Systemic inflammatory response syndrome (SIRS) of non-infectious origin without acute organ dysfunction: Secondary | ICD-10-CM | POA: Diagnosis not present

## 2019-08-31 DIAGNOSIS — R52 Pain, unspecified: Secondary | ICD-10-CM | POA: Diagnosis not present

## 2019-08-31 DIAGNOSIS — A419 Sepsis, unspecified organism: Secondary | ICD-10-CM | POA: Diagnosis not present

## 2019-08-31 LAB — CULTURE, BLOOD (ROUTINE X 2)
Culture: NO GROWTH
Culture: NO GROWTH
Special Requests: ADEQUATE

## 2019-08-31 LAB — GLUCOSE, CAPILLARY
Glucose-Capillary: 183 mg/dL — ABNORMAL HIGH (ref 70–99)
Glucose-Capillary: 238 mg/dL — ABNORMAL HIGH (ref 70–99)
Glucose-Capillary: 222 mg/dL — ABNORMAL HIGH (ref 70–99)
Glucose-Capillary: 208 mg/dL — ABNORMAL HIGH (ref 70–99)

## 2019-08-31 NOTE — Progress Notes (Signed)
PROGRESS NOTE    Natalie Rodgers  TIW:[id number] DOB: 11/14/1940 DOA: 08/25/2019 PCP: Welford Roche, NP   Brief Narrative: Natalie Rodgers is a 79 y.o. female with PMH significant for HTN, DM2 not on insulin, CKD, dementia, chronic anemia. Patient presented secondary to right leg pain. She resides at [facility]. She was found to be febrile on admission with associated evidence concerning for SIRS with unknown source. She was started on empiric antibiotics. She also developed atrial fibrillation with RVR and started on Diltiazem IV.   Assessment & Plan:   Principal Problem:   SIRS (systemic inflammatory response syndrome) (HCC) Active Problems:   Atrial fibrillation with RVR (HCC)   AKI (acute kidney injury) ([place])   Type 2 diabetes mellitus with hyperlipidemia (HCC)   Essential hypertension   Dementia without behavioral disturbance (HCC)   Bilateral foot pain   Sepsis Source of bilateral foot cellulitis and myositis. Present on admission. Does not meet sepsis criteria. SIRS criteria met on admission. Urine culture with no growth (final); blood cultures with no growth to date. COVID-19 and influenza negative. Chest x-ray without acute abnormalities. Left foot x-ray with possible evidence of infection. Empirically started on Vancomycin and Cefepime. Now on Cefepime. Blood cultures no growth to date.  Atrial fibrillation with RVR Initially started on Diltiazem drip which is now discontinued. Currently in sinus rhythm. On Coreg and aspirin as an outpatient. CHA2DS2-VASc Score is 7. Patient with a history of intracranial hemorrhage. Patient reentered RVR on 4/3 secondary to discontinuation of Coreg -Continue Coreg -Will consult cardiology in the morning for recommendations on anticoagulation in setting of previous hemorrhagic stroke  Anaphylactic shock Unknown etiology. Possibly related to barium vs food ingestion. Patient had stridor and hypotension requiring racemic epinephrine,  solu-medrol, Pepcid, benadryl. Patient required norepinephrine for blood pressure support which is now discontinued. Barium added as allergy to patient's chart.  Foot cellulitis MRI significant for cellulitis with mild myositis of both feet. No external evidence noted. -Continue Tylenol prn -Cefepime  Leukocytosis Secondary to infection initially and trended down. Now elevated in setting of steroid use. Doubt related to infection.  Mild right-sided rales Possible aspiration. Clear chest x-ray on admission. Repeat chest x-ray without evidence of infiltrate. Resolved.  CKD stage IV Creatinine of 2.02 on admission. Baseline creatinine of 2.2 per chart review on linked chart and review of outside records. Patient does not have acute kidney injury but stable CKD.  Diabetes mellitus, type 2 Hemoglobin A1C of 7%. Patient is on glipizide as an out patient. -Continue SSI  Essential hypertension Uncontrolled in setting of holding antihypertensives from shock. -Continue Coreg, BiDil and amlodipine, benazepril and chlorthalidone  Hyperlipidemia -Continue Zetia, pravastatin, fenofibrate  Dementia -Continue Aricept  Abdominal distension Improved. Patient with multiple bowel movements.  Dysphagia SLP consulted and barium swallow performed 4/1.     SLP Diet Recommendations: Dysphagia 3 (Mech soft) solids;Thin liquid  Liquid Administration via: Cup;Straw Medication Administration: Whole meds with puree Supervision: Patient able to self feed---  Pt must feed self for appropriate proprioception Compensations: Slow rate;Small sips/bites   DVT prophylaxis: SCDs Code Status:   Code Status: Full Code Family Communication: Daughter at bedside Disposition Plan: Discharge to SNF likely in 1-2 days pending transition to oral antibiotics and cardiology consult for recurrent atrial fibrillation   Consultants:   CCM  Procedures:   None  Antimicrobials:  Vancomycin  Cefepime     Subjective: Some foot pain. Otherwise no concerns  Objective: Vitals:   08/30/19 2104 08/31/19 0105 08/31/19 0559  08/31/19 1350  BP: 140/79 (!) 142/67 (!) 157/80 (!) 149/83  Pulse: 81 87 87 90  Resp: '18 19 18 20  ' Temp: 98.4 F (36.9 C) 99.1 F (37.3 C) 97.9 F (36.6 C)   TempSrc: Oral Oral Oral   SpO2: 97% 96% 97% 98%  Weight:      Height:        Intake/Output Summary (Last 24 hours) at 08/31/2019 1556 Last data filed at 08/31/2019 0930 Gross per 24 hour  Intake 240 ml  Output --  Net 240 ml   Filed Weights   08/13/2019 1537 08/27/19 0135  Weight: 83.9 kg 67.8 kg    Examination:  General exam: Appears calm and comfortable Respiratory system: Clear to auscultation. Respiratory effort normal. Cardiovascular system: S1 & S2 heard, RRR. No murmurs, rubs, gallops or clicks. Gastrointestinal system: Abdomen is distended, soft and nontender. No organomegaly or masses felt. Normal bowel sounds heard. Central nervous system: Alert. Extremities: No edema. No calf tenderness. Some mild tenderness of dorsum of right lateral foot Skin: No cyanosis. No rashes Psychiatry: Judgement and insight appear impaired. Mood & affect appropriate.     Data Reviewed: I have personally reviewed following labs and imaging studies  CBC: Recent Labs  Lab 08/20/2019 1405 08/27/19 0256 08/28/19 0237 08/29/19 0301 08/30/19 0300  WBC 14.9* 13.3* 11.3* 11.9* 16.6*  NEUTROABS 13.0*  --   --   --   --   HGB 12.5 10.7* 9.3* 10.1* 10.0*  HCT 40.0 34.5* 30.4* 32.2* 31.6*  MCV 95.2 98.0 97.7 96.7 95.2  PLT 300 223 195 230 300   Basic Metabolic Panel: Recent Labs  Lab 08/15/2019 1405 08/27/19 0256 08/28/19 0237 08/29/19 0301 08/30/19 0300 08/30/19 1818  NA 142 139 140 140 142  --   K 4.8 4.1 3.6 3.9 3.8  --   CL 105 110 114* 113* 112*  --   CO2 23 19* 18* 18* 20*  --   GLUCOSE 186* 188* 160* 322* 258*  --   BUN 36* 30* 33* 40* 51*  --   CREATININE 2.02* 1.74* 1.83* 1.97* 1.87*  --    CALCIUM 9.3 7.9* 7.4* 7.9* 8.5*  --   MG  --   --   --   --   --  2.0   GFR: Estimated Creatinine Clearance: 20.6 mL/min (A) (by C-G formula based on SCr of 1.87 mg/dL (H)). Liver Function Tests: Recent Labs  Lab 07/28/2019 1405  AST 51*  ALT 42  ALKPHOS 60  BILITOT 0.9  PROT 8.6*  ALBUMIN 4.7   No results for input(s): LIPASE, AMYLASE in the last 168 hours. No results for input(s): AMMONIA in the last 168 hours. Coagulation Profile: Recent Labs  Lab 08/11/2019 1405  INR 0.9   Cardiac Enzymes: No results for input(s): CKTOTAL, CKMB, CKMBINDEX, TROPONINI in the last 168 hours. BNP (last 3 results) No results for input(s): PROBNP in the last 8760 hours. HbA1C: No results for input(s): HGBA1C in the last 72 hours. CBG: Recent Labs  Lab 08/30/19 1246 08/30/19 1655 08/30/19 2107 08/31/19 0813 08/31/19 1129  GLUCAP 179* 131* 183* 183* 238*   Lipid Profile: No results for input(s): CHOL, HDL, LDLCALC, TRIG, CHOLHDL, LDLDIRECT in the last 72 hours. Thyroid Function Tests: Recent Labs    08/30/19 1818  TSH 1.718   Anemia Panel: No results for input(s): VITAMINB12, FOLATE, FERRITIN, TIBC, IRON, RETICCTPCT in the last 72 hours. Sepsis Labs: Recent Labs  Lab 08/06/2019 1405 07/28/2019 1812  LATICACIDVEN 2.4* 2.0*    Recent Results (from the past 240 hour(s))  Blood culture (routine x 2)     Status: None   Collection Time: 08/22/2019  2:05 PM   Specimen: BLOOD  Result Value Ref Range Status   Specimen Description   Final    BLOOD LEFT ANTECUBITAL Performed at [facility] [address], [place], [place] [address]    Special Requests   Final    BOTTLES DRAWN AEROBIC AND ANAEROBIC Blood Culture adequate volume Performed at [facility] [address], [place], [place] [address]    Culture   Final    NO GROWTH 5 DAYS Performed at [facility], [place] [address] [place], [place] [address]    Report Status 08/31/2019 FINAL   Final  Respiratory Panel by RT PCR (Flu A&B, Covid) - Nasopharyngeal Swab     Status: None   Collection Time: 08/10/2019  2:18 PM   Specimen: Nasopharyngeal Swab  Result Value Ref Range Status   SARS Coronavirus 2 by RT PCR NEGATIVE NEGATIVE Final    Comment: (NOTE) SARS-CoV-2 target nucleic acids are NOT DETECTED. The SARS-CoV-2 RNA is generally detectable in upper respiratoy specimens during the acute phase of infection. The lowest concentration of SARS-CoV-2 viral copies this assay can detect is 131 copies/mL. A negative result does not preclude SARS-Cov-2 infection and should not be used as the sole basis for treatment or other patient management decisions. A negative result may occur with  improper specimen collection/handling, submission of specimen other than nasopharyngeal swab, presence of viral mutation(s) within the areas targeted by this assay, and inadequate number of viral copies (<131 copies/mL). A negative result must be combined with clinical observations, patient history, and epidemiological information. The expected result is Negative. Fact Sheet for Patients:  PinkCheek.be Fact Sheet for Healthcare Providers:  GravelBags.it This test is not yet ap proved or cleared by the [place] FDA and  has been authorized for detection and/or diagnosis of SARS-CoV-2 by FDA under an Emergency Use Authorization (EUA). This EUA will remain  in effect (meaning this test can be used) for the duration of the COVID-19 declaration under Section 564(b)(1) of the Act, 21 U.S.C. section 360bbb-3(b)(1), unless the authorization is terminated or revoked sooner.    Influenza A by PCR NEGATIVE NEGATIVE Final   Influenza B by PCR NEGATIVE NEGATIVE Final    Comment: (NOTE) The Xpert Xpress SARS-CoV-2/FLU/RSV assay is intended as an aid in  the diagnosis of influenza from Nasopharyngeal swab specimens and  should not be used as a  sole basis for treatment. Nasal washings and  aspirates are unacceptable for Xpert Xpress SARS-CoV-2/FLU/RSV  testing. Fact Sheet for Patients: PinkCheek.be Fact Sheet for Healthcare Providers: GravelBags.it This test is not yet approved or cleared by the [place] FDA and  has been authorized for detection and/or diagnosis of SARS-CoV-2 by  FDA under an Emergency Use Authorization (EUA). This EUA will remain  in effect (meaning this test can be used) for the duration of the  Covid-19 declaration under Section 564(b)(1) of the Act, 21  U.S.C. section 360bbb-3(b)(1), unless the authorization is  terminated or revoked. Performed at [facility], [place] [address] [place],  [address]   Blood culture (routine x 2)     Status: None   Collection Time: 08/11/2019  2:37 PM   Specimen: BLOOD  Result Value Ref Range Status   Specimen Description   Final  BLOOD RIGHT WRIST Performed at [facility] [address], [place], [place] [address]    Special Requests   Final    BOTTLES DRAWN AEROBIC ONLY Blood Culture results may not be optimal due to an inadequate volume of blood received in culture bottles Performed at [facility] [address] [place], [place] [address]    Culture   Final    NO GROWTH 5 DAYS Performed at [facility], [place] [address], [place], [place] [address]    Report Status 08/31/2019 FINAL  Final  Urine culture     Status: None   Collection Time: 07/28/2019  3:19 PM   Specimen: Urine, Catheterized  Result Value Ref Range Status   Specimen Description   Final    URINE, CATHETERIZED Performed at [facility] [address] [place], [place] [address]    Special Requests   Final    NONE Performed at [facility], [place] [address], [place], [place] [address]    Culture   Final    NO GROWTH Performed  at [facility], [place] [address] [place], [place] [address]    Report Status 08/27/2019 FINAL  Final  MRSA PCR Screening     Status: None   Collection Time: 08/27/19  1:46 AM   Specimen: Nasal Mucosa; Nasopharyngeal  Result Value Ref Range Status   MRSA by PCR NEGATIVE NEGATIVE Final    Comment:        The GeneXpert MRSA Assay (FDA approved for NASAL specimens only), is one component of a comprehensive MRSA colonization surveillance program. It is not intended to diagnose MRSA infection nor to guide or monitor treatment for MRSA infections. Performed at [facility], [place] [address] [place], [place] [address]          Radiology Studies: ECHOCARDIOGRAM COMPLETE  Result Date: 08/30/2019    ECHOCARDIOGRAM REPORT   Patient Name:   [facility] Date of Exam: 08/30/2019 Medical Rec #:  [id number]         Height:       60.0 in Accession #:    [id number]        Weight:       149.5 lb Date of Birth:  1941-03-10         BSA:          1.649 m Patient Age:    34 years          BP:           143/78 mmHg Patient Gender: F                 HR:           119 bpm. Exam Location:  Inpatient Procedure: 2D Echo, Cardiac Doppler and Color Doppler Indications:    I48.0 Paroxysmal atrial fibrillation  History:        Patient has no prior history of Echocardiogram examinations.                 Risk Factors:Hypertension and Diabetes. CKD. GERD.  Sonographer:    Jonelle Sidle Dance Referring Phys: [place]  1. Hyperdynamic LV systolic function; grade 1 diastolic dysfunction; elevated mean gradient across aortic valve likely related to vigorous LV function as visually aortic valve opens well.  2. Left ventricular ejection fraction, by estimation, is >75%. The left ventricle has hyperdynamic function. The left ventricle has no regional wall motion abnormalities.  Left ventricular diastolic parameters are consistent with Grade I diastolic dysfunction (impaired relaxation).   3. Right ventricular systolic function is normal. The right ventricular size is normal.  4. The mitral valve is normal in structure. No evidence of mitral valve regurgitation. No evidence of mitral stenosis.  5. The aortic valve is tricuspid. Aortic valve regurgitation is not visualized. Mild to moderate aortic valve sclerosis/calcification is present, without any evidence of aortic stenosis.  6. The inferior vena cava is normal in size with greater than 50% respiratory variability, suggesting right atrial pressure of 3 mmHg. FINDINGS  Left Ventricle: Left ventricular ejection fraction, by estimation, is >75%. The left ventricle has hyperdynamic function. The left ventricle has no regional wall motion abnormalities. The left ventricular internal cavity size was normal in size. There is no left ventricular hypertrophy. Left ventricular diastolic parameters are consistent with Grade I diastolic dysfunction (impaired relaxation). Right Ventricle: The right ventricular size is normal.Right ventricular systolic function is normal. Left Atrium: Left atrial size was normal in size. Right Atrium: Right atrial size was normal in size. Pericardium: There is no evidence of pericardial effusion. Mitral Valve: The mitral valve is normal in structure. Normal mobility of the mitral valve leaflets. Mild mitral annular calcification. No evidence of mitral valve regurgitation. No evidence of mitral valve stenosis. Tricuspid Valve: The tricuspid valve is normal in structure. Tricuspid valve regurgitation is trivial. No evidence of tricuspid stenosis. Aortic Valve: The aortic valve is tricuspid. Aortic valve regurgitation is not visualized. Mild to moderate aortic valve sclerosis/calcification is present, without any evidence of aortic stenosis. Aortic valve mean gradient measures 12.0 mmHg. Aortic valve peak gradient measures 24.4 mmHg. Aortic valve area, by VTI measures 1.41 cm. Pulmonic Valve: The pulmonic valve was not well  visualized. Pulmonic valve regurgitation is not visualized. No evidence of pulmonic stenosis. Aorta: The aortic root is normal in size and structure. Venous: The inferior vena cava is normal in size with greater than 50% respiratory variability, suggesting right atrial pressure of 3 mmHg.  Additional Comments: Hyperdynamic LV systolic function; grade 1 diastolic dysfunction; elevated mean gradient across aortic valve likely related to vigorous LV function as visually aortic valve opens well.  LEFT VENTRICLE PLAX 2D LVIDd:         3.60 cm LVIDs:         2.60 cm LV PW:         1.10 cm LV IVS:        1.00 cm LVOT diam:     2.00 cm LV SV:         58 LV SV Index:   35 LVOT Area:     3.14 cm  RIGHT VENTRICLE             IVC RV Basal diam:  2.30 cm     IVC diam: 1.70 cm RV S prime:     16.60 cm/s TAPSE (M-mode): 1.7 cm LEFT ATRIUM             Index       RIGHT ATRIUM           Index LA diam:        3.30 cm 2.00 cm/m  RA Area:     12.80 cm LA Vol (A2C):   17.9 ml 10.85 ml/m RA Volume:   29.90 ml  18.13 ml/m LA Vol (A4C):   19.9 ml 12.07 ml/m LA Biplane Vol: 20.8 ml 12.61 ml/m  AORTIC VALVE AV Area (Vmax):  1.30 cm AV Area (Vmean):   1.36 cm AV Area (VTI):     1.41 cm AV Vmax:           247.00 cm/s AV Vmean:          155.500 cm/s AV VTI:            0.416 m AV Peak Grad:      24.4 mmHg AV Mean Grad:      12.0 mmHg LVOT Vmax:         101.95 cm/s LVOT Vmean:        67.350 cm/s LVOT VTI:          0.186 m LVOT/AV VTI ratio: 0.45  AORTA Ao Root diam: 3.60 cm Ao Asc diam:  3.40 cm MV A velocity: 128.00 cm/s                             SHUNTS                             Systemic VTI:  0.19 m                             Systemic Diam: 2.00 cm Kirk Ruths MD Electronically signed by Kirk Ruths MD Signature Date/Time: 08/30/2019/3:04:54 PM    Final         Scheduled Meds: . amLODipine  10 mg Oral Daily  . aspirin EC  81 mg Oral Daily  . benazepril  20 mg Oral Daily  . carvedilol  25 mg Oral BID WC  .  Chlorhexidine Gluconate Cloth  6 each Topical Daily  . chlorthalidone  12.5 mg Oral Daily  . donepezil  10 mg Oral QHS  . ezetimibe  10 mg Oral Daily  . fenofibrate  160 mg Oral Daily  . insulin aspart  0-5 Units Subcutaneous QHS  . insulin aspart  0-9 Units Subcutaneous TID WC  . isosorbide-hydrALAZINE  1 tablet Oral TID  . melatonin  2 mg Oral QHS  . pravastatin  80 mg Oral QHS  . QUEtiapine  25 mg Oral Daily  . QUEtiapine  50 mg Oral QHS  . senna  1 tablet Oral BID  . sodium chloride flush  3 mL Intravenous Q12H   Continuous Infusions: . sodium chloride Stopped (08/30/19 0911)  . ceFEPime (MAXIPIME) IV 2 g (08/31/19 1417)     LOS: 5 days  CRITICAL CARE Performed by: Cordelia Poche, MD   Total critical care time: 50 minutes  Critical care time was exclusive of separately billable procedures and treating other patients.  Critical care was necessary to treat or prevent imminent or life-threatening deterioration.  Critical care was time spent personally by me on the following activities: development of treatment plan with patient and/or surrogate as well as nursing, discussions with consultants, evaluation of patient's response to treatment, examination of patient, obtaining history from patient or surrogate, ordering and performing treatments and interventions, ordering and review of laboratory studies, ordering and review of radiographic studies, pulse oximetry and re-evaluation of patient's condition.   Cordelia Poche, MD [facility] 08/31/2019, 3:56 PM  If 7PM-7AM, please contact night-coverage [email or web address]

## 2019-09-01 DIAGNOSIS — M79671 Pain in right foot: Secondary | ICD-10-CM | POA: Diagnosis not present

## 2019-09-01 DIAGNOSIS — R52 Pain, unspecified: Secondary | ICD-10-CM | POA: Diagnosis not present

## 2019-09-01 DIAGNOSIS — N179 Acute kidney failure, unspecified: Secondary | ICD-10-CM | POA: Diagnosis not present

## 2019-09-01 DIAGNOSIS — I4891 Unspecified atrial fibrillation: Secondary | ICD-10-CM | POA: Diagnosis not present

## 2019-09-01 DIAGNOSIS — A419 Sepsis, unspecified organism: Secondary | ICD-10-CM | POA: Diagnosis not present

## 2019-09-01 DIAGNOSIS — R6510 Systemic inflammatory response syndrome (SIRS) of non-infectious origin without acute organ dysfunction: Secondary | ICD-10-CM | POA: Diagnosis not present

## 2019-09-01 LAB — CBC
HCT: 31.9 % — ABNORMAL LOW (ref 36.0–46.0)
Hemoglobin: 10.1 g/dL — ABNORMAL LOW (ref 12.0–15.0)
MCH: 30.1 pg (ref 26.0–34.0)
MCHC: 31.7 g/dL (ref 30.0–36.0)
MCV: 94.9 fL (ref 80.0–100.0)
Platelets: 301 10*3/uL (ref 150–400)
RBC: 3.36 MIL/uL — ABNORMAL LOW (ref 3.87–5.11)
RDW: 14.4 % (ref 11.5–15.5)
WBC: 12.5 10*3/uL — ABNORMAL HIGH (ref 4.0–10.5)
nRBC: 0.2 % (ref 0.0–0.2)

## 2019-09-01 LAB — GLUCOSE, CAPILLARY
Glucose-Capillary: 176 mg/dL — ABNORMAL HIGH (ref 70–99)
Glucose-Capillary: 331 mg/dL — ABNORMAL HIGH (ref 70–99)
Glucose-Capillary: 294 mg/dL — ABNORMAL HIGH (ref 70–99)
Glucose-Capillary: 196 mg/dL — ABNORMAL HIGH (ref 70–99)

## 2019-09-01 MED ORDER — CEFDINIR 300 MG PO CAPS
300.0000 mg | ORAL_CAPSULE | Freq: Every day | ORAL | Status: DC
  Administered 2019-09-01 – 2019-09-03 (×3): 300 mg via ORAL
  Filled 2019-09-01 (×5): qty 1

## 2019-09-01 MED ORDER — DICLOFENAC SODIUM 1 % EX GEL
4.0000 g | Freq: Four times a day (QID) | CUTANEOUS | Status: DC
  Administered 2019-09-01 – 2019-09-04 (×11): 4 g via TOPICAL
  Filled 2019-09-01: qty 100

## 2019-09-01 NOTE — Progress Notes (Addendum)
Pharmacy Antibiotic Note  Natalie Rodgers is a 79 y.o. female presented to the ED from [facility] facility on 08/05/2019 with c/o leg pain. In the ED, she was found to be febrile with elevated wbc. Pharmacy has been consulted to dose cefepime for suspected sepsis.  Today, 09/01/19  Day 7 Cefepime, anticipate po abx  Afeb, WBC 15 >> 12.5, SCr 2.02 >> 1.87 (4/3)  Microbiology results:  3/30 COVID: neg; Influenza A/B: neg 3/30 BCx: ng-final 3/30 UCx: NGF 3/31 MRSA PCR: neg  Plan:  Continue cefepime 2g IV q24h   f/u renal function closely and adjust dose if/when appropriate  __________________________  Height: 5' (152.4 cm) Weight: 67.8 kg (149 lb 7.6 oz) IBW/kg (Calculated) : 45.5  Temp (24hrs), Avg:98.2 F (36.8 C), Min:98.1 F (36.7 C), Max:98.3 F (36.8 C)  Recent Labs  Lab 08/24/2019 1405 08/13/2019 1405 08/17/2019 1812 08/27/19 0256 08/28/19 0237 08/29/19 0301 08/30/19 0300 09/01/19 0534  WBC 14.9*   < >  --  13.3* 11.3* 11.9* 16.6* 12.5*  CREATININE 2.02*  --   --  1.74* 1.83* 1.97* 1.87*  --   LATICACIDVEN 2.4*  --  2.0*  --   --   --   --   --    < > = values in this interval not displayed.    Estimated Creatinine Clearance: 20.6 mL/min (A) (by C-G formula based on SCr of 1.87 mg/dL (H)).    Allergies  Allergen Reactions  . Barium-Containing Compounds Anaphylaxis    Stridor, shock     Thank you for allowing pharmacy to be a part of this patient's care.  Minda Ditto 09/01/2019 1:15 PM

## 2019-09-01 NOTE — TOC Initial Note (Signed)
Transition of Care [facility]) - Initial/Assessment Note    Patient Details  Name: Natalie Rodgers Name MRN: [id number] Date of Birth: Oct 09, 1940  Transition of Care [facility]) CM/SW Contact:    Lennart Pall, LCSW Phone Number: 09/01/2019, 3:57 PM  Clinical Narrative:                 Introduced myself to pt, however, she appeared very fatigued and does not engage verbally.  Contacted daughter, Verdis Frederickson, to review prior living situation and potential d/c care needs.  Daughter notes that pt has lived at [place] for "several years" and was relatively independent in her apartment.  Walking without AD and did have some assist with ADLs.  Explained that pt may need SNF level of care initially, however, awaiting feedback from OT eval as well.  PT is recommending SNF. Daughter understands that I will follow back up with her with all therapy recommendations and assist either with return to ALF or d/c to SNF for rehab.  Expected Discharge Plan: Skilled Nursing Facility(vs. return to ALF) Barriers to Discharge: Continued Medical Work up   Patient Goals and CMS Choice Patient states their goals for this hospitalization and ongoing recovery are:: unable to verbalize      Expected Discharge Plan and Services Expected Discharge Plan: Skilled Nursing Facility(vs. return to ALF) In-house Referral: Clinical Social Work     Living arrangements for the past 2 months: Assisted Living Facility([facility])                                      Prior Living Arrangements/Services Living arrangements for the past 2 months: Assisted Living Facility([facility]) Lives with:: Facility Resident Patient language and need for interpreter reviewed:: Yes        Need for Family Participation in Patient Care: Yes (Comment) Care giver support system in place?: Yes (comment)   Criminal Activity/Legal Involvement Pertinent to Current Situation/Hospitalization: No - Comment as needed  Activities of  Daily Living Home Assistive Devices/Equipment: CBG Meter, Blood pressure cuff, Grab bars around toilet, Grab bars in shower, Hand-held shower hose, Hospital bed, Walker (specify type), Wheelchair ADL Screening (condition at time of admission) Patient's cognitive ability adequate to safely complete daily activities?: No Is the patient deaf or have difficulty hearing?: Yes Does the patient have difficulty seeing, even when wearing glasses/contacts?: No Does the patient have difficulty concentrating, remembering, or making decisions?: Yes Patient able to express need for assistance with ADLs?: Yes Does the patient have difficulty dressing or bathing?: Yes Independently performs ADLs?: No Communication: Needs assistance Is this a change from baseline?: Pre-admission baseline Dressing (OT): Needs assistance Is this a change from baseline?: Pre-admission baseline Grooming: Needs assistance Is this a change from baseline?: Pre-admission baseline Feeding: Independent Bathing: Needs assistance Is this a change from baseline?: Pre-admission baseline Toileting: Needs assistance Is this a change from baseline?: Pre-admission baseline In/Out Bed: Needs assistance Is this a change from baseline?: Pre-admission baseline Walks in Home: Independent with device (comment) Does the patient have difficulty walking or climbing stairs?: Yes Weakness of Legs: Right Weakness of Arms/Hands: Right  Permission Sought/Granted Permission sought to share information with : Family Supports Permission granted to share information with : Yes, Verbal Permission Granted  Share Information with NAME: Benjie Karvonen     Permission granted to share info w Relationship: daughter  Permission granted to share info w Contact Information: [phone]  Emotional  Assessment Appearance:: Appears stated age Attitude/Demeanor/Rapport: Lethargic Affect (typically observed): Quiet Orientation: : (unable to assess)   Psych  Involvement: No (comment)  Admission diagnosis:  Pain [R52] Atrial fibrillation with RVR ([place]) [I48.91] Sepsis ([place]) [A41.9] Sepsis, due to unspecified organism, unspecified whether acute organ dysfunction present [facility]) [A41.9] Patient Active Problem List   Diagnosis Date Noted  . Atrial fibrillation with RVR ([place]) 08/27/2019  . AKI (acute kidney injury) ([place]) 08/27/2019  . Type 2 diabetes mellitus with hyperlipidemia ([place]) 08/27/2019  . Essential hypertension 08/27/2019  . Dementia without behavioral disturbance ([place]) 08/27/2019  . Bilateral foot pain 08/27/2019  . SIRS (systemic inflammatory response syndrome) ([place]) 07/28/2019   PCP:  Welford Roche, NP Pharmacy:  No Pharmacies Listed    Social Determinants of Health (SDOH) Interventions    Readmission Risk Interventions Readmission Risk Prevention Plan 09/01/2019  Transportation Screening Complete  Palliative Care Screening Not Applicable

## 2019-09-01 NOTE — Evaluation (Signed)
Occupational Therapy Evaluation Patient Details Name: Natalie Rodgers MRN: [id number] DOB: 1941/04/18 Today's Date: 09/01/2019    History of Present Illness Pt is 79 yo female with PMH including HTN, DM2, CKD, mild dementia, anemia, hx of CVA with R hemiparesis. Pt presented to ED with complaint of leg and foot pain.  Imaging of feet - negative for acute fx.  Pt admitted with SIRS (no clear source per note) and afib with RVR.   Clinical Impression   Pt admitted with the above. Pt currently with functional limitations due to the deficits listed below (see OT Problem List).  Pt will benefit from skilled OT to increase their safety and independence with ADL and functional mobility for ADL to facilitate discharge to venue listed below.   Do not feel ALF will be able to accommodate pt at this level unless they are able to provide total A     Follow Up Recommendations  SNF    Equipment Recommendations  None recommended by OT    Recommendations for Other Services       Precautions / Restrictions Precautions Precautions: Fall      Mobility Bed Mobility Overal bed mobility: Needs Assistance Bed Mobility: Supine to Sit;Sit to Supine     Supine to sit: Max assist Sit to supine: Max assist   General bed mobility comments: pt sat EOB with OT for grooming activity for approx 5 min  Transfers                 General transfer comment: did not perform    Balance Overall balance assessment: Needs assistance Sitting-balance support: Bilateral upper extremity supported;Feet unsupported Sitting balance-Leahy Scale: Poor                                     ADL either performed or assessed with clinical judgement   ADL Overall ADL's : Needs assistance/impaired     Grooming: Wash/dry face;Oral care;Sitting;Maximal assistance                                  Limited ADL eval as pt wanted to lie back down.  Overall pt would be total Care at this  point with ADL activity based on OT observations and sitting EOB with pt this day                  Pertinent Vitals/Pain Pain Assessment: No/denies pain Faces Pain Scale: Hurts little more Pain Location: feet Pain Descriptors / Indicators: Grimacing;Moaning Pain Intervention(s): Limited activity within patient's tolerance;Repositioned;Other (comment)(shared with RN)     Hand Dominance     Extremity/Trunk Assessment Upper Extremity Assessment Upper Extremity Assessment: Generalized weakness;RUE deficits/detail(LUE stronger than RUE) RUE Deficits / Details: AAROM WFL RUE but needed Assist initially           Communication Communication Communication: No difficulties;Other (comment)(Per daughter: understands English well, speaks English but sometimes difficult to understand due to speaks Tagalog)   Cognition Arousal/Alertness: Awake/alert Behavior During Therapy: WFL for tasks assessed/performed                                   General Comments: Patient is constantly  speaking in her native language but does speak english, she says she lives in ALF, Gets iaround in a WC.  Home Living Family/patient expects to be discharged to:: Skilled nursing facility([facility])                                        Prior Functioning/Environment Level of Independence: Needs assistance  Gait / Transfers Assistance Needed: Walks without AD; could walk to dining hall ADL's / Homemaking Assistance Needed: Staff assist with bathing and dressing; pt able to do toielting            OT Problem List: Decreased strength;Decreased activity tolerance;Impaired balance (sitting and/or standing);Decreased safety awareness;Decreased knowledge of use of DME or AE;Decreased knowledge of precautions      OT Treatment/Interventions: Self-care/ADL training;Therapeutic exercise;DME and/or AE instruction;Patient/family education    OT Goals(Current goals  can be found in the care plan section) Acute Rehab OT Goals Patient Stated Goal: did not state Time For Goal Achievement: 09/08/19 Potential to Achieve Goals: Fair  OT Frequency: Min 2X/week   Barriers to D/C: Decreased caregiver support             AM-PAC OT "6 Clicks" Daily Activity     Outcome Measure Help from another person eating meals?: A Lot Help from another person taking care of personal grooming?: A Lot Help from another person toileting, which includes using toliet, bedpan, or urinal?: Total Help from another person bathing (including washing, rinsing, drying)?: Total Help from another person to put on and taking off regular upper body clothing?: Total Help from another person to put on and taking off regular lower body clothing?: Total 6 Click Score: 8   End of Session Nurse Communication: Mobility status  Activity Tolerance: Patient limited by fatigue Patient left: in bed;with call bell/phone within reach  OT Visit Diagnosis: Unsteadiness on feet (R26.81);Other abnormalities of gait and mobility (R26.89);Muscle weakness (generalized) (M62.81)                Time: 8828-0034 OT Time Calculation (min): 15 min Charges:  OT General Charges $OT Visit: 1 Visit OT Evaluation $OT Eval Moderate Complexity: 1 Mod  Kari Baars, OT Acute Rehabilitation Services [phone] Office- [phone], Edwena Felty D 09/01/2019, 6:08 PM

## 2019-09-01 NOTE — Progress Notes (Signed)
PROGRESS NOTE    Natalie Rodgers  QQI:[id number] DOB: 02/18/1941 DOA: 08/18/2019 PCP: Natalie Roche, NP   Brief Narrative: Natalie Rodgers is a 79 y.o. female with PMH significant for HTN, DM2 not on insulin, CKD, dementia, chronic anemia. Patient presented secondary to right leg pain. She resides at [facility]. She was found to be febrile on admission with associated evidence concerning for SIRS with unknown source. She was started on empiric antibiotics. She also developed atrial fibrillation with RVR and started on Diltiazem IV.   Assessment & Plan:   Principal Problem:   SIRS (systemic inflammatory response syndrome) (HCC) Active Problems:   Atrial fibrillation with RVR (HCC)   AKI (acute kidney injury) ([place])   Type 2 diabetes mellitus with hyperlipidemia (HCC)   Essential hypertension   Dementia without behavioral disturbance (HCC)   Bilateral foot pain   Sepsis Source of bilateral foot cellulitis and myositis. Present on admission. Does not meet sepsis criteria. SIRS criteria met on admission. Urine culture with no growth (final); blood cultures with no growth to date. COVID-19 and influenza negative. Chest x-ray without acute abnormalities. Left foot x-ray with possible evidence of infection. Empirically started on Vancomycin and Cefepime. Now on Cefepime. Blood cultures no growth to date.  Atrial fibrillation with RVR Initially started on Diltiazem drip which is now discontinued. Currently in sinus rhythm. On Coreg and aspirin as an outpatient. CHA2DS2-VASc Score is 7. Patient with a history of intracranial hemorrhage. Patient reentered RVR on 4/3 secondary to discontinuation of [place] [facility]ology  Anaphylactic shock Unknown etiology. Possibly related to barium vs food ingestion. Patient had stridor and hypotension requiring racemic epinephrine, solu-medrol, Pepcid, benadryl. Patient required norepinephrine for blood pressure support which is now  discontinued. Barium added as allergy to patient's chart.  Foot cellulitis MRI significant for cellulitis with mild myositis of both feet. No external evidence noted. -Continue Tylenol prn -Transition to Cefdinir. Will treat for 10-14 days based on symptoms -Voltaren gel for pain  Leukocytosis Secondary to infection initially and trended down. Now elevated in setting of steroid use. Doubt related to infection.  Mild right-sided rales Possible aspiration. Clear chest x-ray on admission. Repeat chest x-ray without evidence of infiltrate. Resolved.  CKD stage IV Creatinine of 2.02 on admission. Baseline creatinine of 2.2 per chart review on linked chart and review of outside records. Patient does not have acute kidney injury but stable CKD.  Diabetes mellitus, type 2 Hemoglobin A1C of 7%. Patient is on glipizide as an out patient. -Continue SSI  Essential hypertension Uncontrolled in setting of holding antihypertensives from shock. -Continue Coreg, BiDil and amlodipine, benazepril and chlorthalidone  Hyperlipidemia -Continue Zetia, pravastatin, fenofibrate  Dementia -Continue Aricept  Abdominal distension Improved. Patient with multiple bowel movements.  Dysphagia SLP consulted and barium swallow performed 4/1.     SLP Diet Recommendations: Dysphagia 3 (Mech soft) solids;Thin liquid  Liquid Administration via: Cup;Straw Medication Administration: Whole meds with puree Supervision: Patient able to self feed---  Pt must feed self for appropriate proprioception Compensations: Slow rate;Small sips/bites   DVT prophylaxis: SCDs Code Status:   Code Status: Full Code Family Communication: Daughter on telephone Disposition Plan: Discharge to SNF when bed available, pending cardiology consult, PT/OT   Consultants:   CCM  Cardiology  Procedures:   None  Antimicrobials:  Vancomycin  Cefepime    Subjective: Foot pain. No other concerns.  Objective: Vitals:     08/31/19 1350 08/31/19 2106 09/01/19 0525 09/01/19 0526  BP: (!) 149/83 Marland Kitchen)  146/71 (!) 174/72 (!) 149/79  Pulse: 90 82 85 82  Resp: _0 Temp:  98.3 F (36.8 C) 98.1 F (36.7 C)   TempSrc:      SpO2: 98% 95% 94% 97%  Weight:      Height:        Intake/Output Summary (Last 24 hours) at 09/01/2019 1340 Last data filed at 09/01/2019 1208 Gross per 24 hour  Intake 672 ml  Output 800 ml  Net -128 ml   Filed Weights   08/06/2019 1537 08/27/19 0135  Weight: 83.9 kg 67.8 kg    Examination:  General exam: Appears calm and comfortable Respiratory system: Clear to auscultation. Respiratory effort normal. Cardiovascular system: S1 & S2 heard, RRR. No murmurs, rubs, gallops or clicks. Gastrointestinal system: Abdomen is slightly distended, soft and nontender. No organomegaly or masses felt. Normal bowel sounds heard. Central nervous system: Alert  Extremities: No edema. No calf tenderness. Some tenderness of both feet Skin: No cyanosis. No rashes    Data Reviewed: I have personally reviewed following labs and imaging studies  CBC: Recent Labs  Lab 08/18/2019 1405 08/08/2019 1405 08/27/19 0256 08/28/19 0237 08/29/19 0301 08/30/19 0300 09/01/19 0534  WBC 14.9*   < > 13.3* 11.3* 11.9* 16.6* 12.5*  NEUTROABS 13.0*  --   --   --   --   --   --   HGB 12.5   < > 10.7* 9.3* 10.1* 10.0* 10.1*  HCT 40.0   < > 34.5* 30.4* 32.2* 31.6* 31.9*  MCV 95.2   < > 98.0 97.7 96.7 95.2 94.9  PLT 300   < > 223 195 230 301 301   < > = values in this interval not displayed.   Basic Metabolic Panel: Recent Labs  Lab 08/20/2019 1405 08/27/19 0256 08/28/19 0237 08/29/19 0301 08/30/19 0300 08/30/19 1818  NA 142 139 140 140 142  --   K 4.8 4.1 3.6 3.9 3.8  --   CL 105 110 114* 113* 112*  --   CO2 23 19* 18* 18* 20*  --   GLUCOSE 186* 188* 160* 322* 258*  --   BUN 36* 30* 33* 40* 51*  --   CREATININE 2.02* 1.74* 1.83* 1.97* 1.87*  --   CALCIUM 9.3 7.9* 7.4* 7.9* 8.5*  --   MG  --   --   --    --   --  2.0   GFR: Estimated Creatinine Clearance: 20.6 mL/min (A) (by C-G formula based on SCr of 1.87 mg/dL (H)). Liver Function Tests: Recent Labs  Lab 08/27/2019 1405  AST 51*  ALT 42  ALKPHOS 60  BILITOT 0.9  PROT 8.6*  ALBUMIN 4.7   No results for input(s): LIPASE, AMYLASE in the last 168 hours. No results for input(s): AMMONIA in the last 168 hours. Coagulation Profile: Recent Labs  Lab 08/05/2019 1405  INR 0.9   Cardiac Enzymes: No results for input(s): CKTOTAL, CKMB, CKMBINDEX, TROPONINI in the last 168 hours. BNP (last 3 results) No results for input(s): PROBNP in the last 8760 hours. HbA1C: No results for input(s): HGBA1C in the last 72 hours. CBG: Recent Labs  Lab 08/31/19 1129 08/31/19 1642 08/31/19 2104 09/01/19 0734 09/01/19 1123  GLUCAP 238* 222* 208* 176* 331*   Lipid Profile: No results for input(s): CHOL, HDL, LDLCALC, TRIG, CHOLHDL, LDLDIRECT in the last 72 hours. Thyroid Function Tests: Recent Labs    08/30/19 1818  TSH 1.718   Anemia Panel: No  results for input(s): VITAMINB12, FOLATE, FERRITIN, TIBC, IRON, RETICCTPCT in the last 72 hours. Sepsis Labs: Recent Labs  Lab 07/28/2019 1405 08/21/2019 1812  LATICACIDVEN 2.4* 2.0*    Recent Results (from the past 240 hour(s))  Blood culture (routine x 2)     Status: None   Collection Time: 08/15/2019  2:05 PM   Specimen: BLOOD  Result Value Ref Range Status   Specimen Description   Final    BLOOD LEFT ANTECUBITAL Performed at [facility] [address], [place], [place] [address]    Special Requests   Final    BOTTLES DRAWN AEROBIC AND ANAEROBIC Blood Culture adequate volume Performed at [name] [address] [place], [place] [address]    Culture   Final    NO GROWTH 5 DAYS Performed at [facility], [place] [address] [place], [place] [address]    Report Status 08/31/2019 FINAL  Final  Respiratory Panel by RT PCR (Flu A&B, Covid) -  Nasopharyngeal Swab     Status: None   Collection Time: 08/08/2019  2:18 PM   Specimen: Nasopharyngeal Swab  Result Value Ref Range Status   SARS Coronavirus 2 by RT PCR NEGATIVE NEGATIVE Final    Comment: (NOTE) SARS-CoV-2 target nucleic acids are NOT DETECTED. The SARS-CoV-2 RNA is generally detectable in upper respiratoy specimens during the acute phase of infection. The lowest concentration of SARS-CoV-2 viral copies this assay can detect is 131 copies/mL. A negative result does not preclude SARS-Cov-2 infection and should not be used as the sole basis for treatment or other patient management decisions. A negative result may occur with  improper specimen collection/handling, submission of specimen other than nasopharyngeal swab, presence of viral mutation(s) within the areas targeted by this assay, and inadequate number of viral copies (<131 copies/mL). A negative result must be combined with clinical observations, patient history, and epidemiological information. The expected result is Negative. Fact Sheet for Patients:  PinkCheek.be Fact Sheet for Healthcare Providers:  GravelBags.it This test is not yet ap proved or cleared by the [place] FDA and  has been authorized for detection and/or diagnosis of SARS-CoV-2 by FDA under an Emergency Use Authorization (EUA). This EUA will remain  in effect (meaning this test can be used) for the duration of the COVID-19 declaration under Section 564(b)(1) of the Act, 21 U.S.C. section 360bbb-3(b)(1), unless the authorization is terminated or revoked sooner.    Influenza A by PCR NEGATIVE NEGATIVE Final   Influenza B by PCR NEGATIVE NEGATIVE Final    Comment: (NOTE) The Xpert Xpress SARS-CoV-2/FLU/RSV assay is intended as an aid in  the diagnosis of influenza from Nasopharyngeal swab specimens and  should not be used as a sole basis for treatment. Nasal washings and  aspirates  are unacceptable for Xpert Xpress SARS-CoV-2/FLU/RSV  testing. Fact Sheet for Patients: PinkCheek.be Fact Sheet for Healthcare Providers: GravelBags.it This test is not yet approved or cleared by the [place] FDA and  has been authorized for detection and/or diagnosis of SARS-CoV-2 by  FDA under an Emergency Use Authorization (EUA). This EUA will remain  in effect (meaning this test can be used) for the duration of the  Covid-19 declaration under Section 564(b)(1) of the Act, 21  U.S.C. section 360bbb-3(b)(1), unless the authorization is  terminated or revoked. Performed at [facility], [place] [address], [place],  [address]   Blood culture (routine x 2)     Status: None   Collection  Time: 08/10/2019  2:37 PM   Specimen: BLOOD  Result Value Ref Range Status   Specimen Description   Final    BLOOD RIGHT WRIST Performed at [facility] [address] [place], [place] [address]    Special Requests   Final    BOTTLES DRAWN AEROBIC ONLY Blood Culture results may not be optimal due to an inadequate volume of blood received in culture bottles Performed at [facility] [address], [place], [place] [address]    Culture   Final    NO GROWTH 5 DAYS Performed at [facility], [place] [address] [place], [place] [address]    Report Status 08/31/2019 FINAL  Final  Urine culture     Status: None   Collection Time: 08/24/2019  3:19 PM   Specimen: Urine, Catheterized  Result Value Ref Range Status   Specimen Description   Final    URINE, CATHETERIZED Performed at [facility] [address], [place], [place] [address]    Special Requests   Final    NONE Performed at [facility], [place] [address] [place], [place] [address]    Culture   Final    NO GROWTH Performed at [facility], [place] [address], [place],  [place] [address]    Report Status 08/27/2019 FINAL  Final  MRSA PCR Screening     Status: None   Collection Time: 08/27/19  1:46 AM   Specimen: Nasal Mucosa; Nasopharyngeal  Result Value Ref Range Status   MRSA by PCR NEGATIVE NEGATIVE Final    Comment:        The GeneXpert MRSA Assay (FDA approved for NASAL specimens only), is one component of a comprehensive MRSA colonization surveillance program. It is not intended to diagnose MRSA infection nor to guide or monitor treatment for MRSA infections. Performed at [facility], [place] [address] [place], [place] [address]          Radiology Studies: ECHOCARDIOGRAM COMPLETE  Result Date: 08/30/2019    ECHOCARDIOGRAM REPORT   Patient Name:   [facility] Date of Exam: 08/30/2019 Medical Rec #:  [id number]         Height:       60.0 in Accession #:    [id number]        Weight:       149.5 lb Date of Birth:  1940/11/01         BSA:          1.649 m Patient Age:    70 years          BP:           143/78 mmHg Patient Gender: F                 HR:           119 bpm. Exam Location:  Inpatient Procedure: 2D Echo, Cardiac Doppler and Color Doppler Indications:    I48.0 Paroxysmal atrial fibrillation  History:        Patient has no prior history of Echocardiogram examinations.                 Risk Factors:Hypertension and Diabetes. CKD. GERD.  Sonographer:    Jonelle Sidle Dance Referring Phys: [place]  1. Hyperdynamic LV systolic function; grade 1 diastolic dysfunction; elevated mean gradient across aortic valve likely related to vigorous LV function as visually aortic valve opens well.  2. Left ventricular ejection fraction, by estimation, is >75%. The left ventricle has hyperdynamic function. The left ventricle has no regional wall motion abnormalities. Left ventricular diastolic parameters are consistent with Grade I diastolic dysfunction (impaired relaxation).  3. Right ventricular systolic function is normal. The  right ventricular size is normal.  4. The mitral valve is normal in structure. No evidence of mitral valve regurgitation. No evidence of mitral stenosis.  5. The aortic valve is tricuspid. Aortic valve regurgitation is not visualized. Mild to moderate aortic valve sclerosis/calcification is present, without any evidence of aortic stenosis.  6. The inferior vena cava is normal in size with greater than 50% respiratory variability, suggesting right atrial pressure of 3 mmHg. FINDINGS  Left Ventricle: Left ventricular ejection fraction, by estimation, is >75%. The left ventricle has hyperdynamic function. The left ventricle has no regional wall motion abnormalities. The left ventricular internal cavity size was normal in size. There is no left ventricular hypertrophy. Left ventricular diastolic parameters are consistent with Grade I diastolic dysfunction (impaired relaxation). Right Ventricle: The right ventricular size is normal.Right ventricular systolic function is normal. Left Atrium: Left atrial size was normal in size. Right Atrium: Right atrial size was normal in size. Pericardium: There is no evidence of pericardial effusion. Mitral Valve: The mitral valve is normal in structure. Normal mobility of the mitral valve leaflets. Mild mitral annular calcification. No evidence of mitral valve regurgitation. No evidence of mitral valve stenosis. Tricuspid Valve: The tricuspid valve is normal in structure. Tricuspid valve regurgitation is trivial. No evidence of tricuspid stenosis. Aortic Valve: The aortic valve is tricuspid. Aortic valve regurgitation is not visualized. Mild to moderate aortic valve sclerosis/calcification is present, without any evidence of aortic stenosis. Aortic valve mean gradient measures 12.0 mmHg. Aortic valve peak gradient measures 24.4 mmHg. Aortic valve area, by VTI measures 1.41 cm. Pulmonic Valve: The pulmonic valve was not well visualized. Pulmonic valve regurgitation is not visualized.  No evidence of pulmonic stenosis. Aorta: The aortic root is normal in size and structure. Venous: The inferior vena cava is normal in size with greater than 50% respiratory variability, suggesting right atrial pressure of 3 mmHg.  Additional Comments: Hyperdynamic LV systolic function; grade 1 diastolic dysfunction; elevated mean gradient across aortic valve likely related to vigorous LV function as visually aortic valve opens well.  LEFT VENTRICLE PLAX 2D LVIDd:         3.60 cm LVIDs:         2.60 cm LV PW:         1.10 cm LV IVS:        1.00 cm LVOT diam:     2.00 cm LV SV:         58 LV SV Index:   35 LVOT Area:     3.14 cm  RIGHT VENTRICLE             IVC RV Basal diam:  2.30 cm     IVC diam: 1.70 cm RV S prime:     16.60 cm/s TAPSE (M-mode): 1.7 cm LEFT ATRIUM             Index       RIGHT ATRIUM           Index LA diam:        3.30 cm 2.00 cm/m  RA Area:     12.80 cm LA Vol (A2C):   17.9 ml 10.85 ml/m RA Volume:   29.90 ml  18.13 ml/m LA  Vol (A4C):   19.9 ml 12.07 ml/m LA Biplane Vol: 20.8 ml 12.61 ml/m  AORTIC VALVE AV Area (Vmax):    1.30 cm AV Area (Vmean):   1.36 cm AV Area (VTI):     1.41 cm AV Vmax:           247.00 cm/s AV Vmean:          155.500 cm/s AV VTI:            0.416 m AV Peak Grad:      24.4 mmHg AV Mean Grad:      12.0 mmHg LVOT Vmax:         101.95 cm/s LVOT Vmean:        67.350 cm/s LVOT VTI:          0.186 m LVOT/AV VTI ratio: 0.45  AORTA Ao Root diam: 3.60 cm Ao Asc diam:  3.40 cm MV A velocity: 128.00 cm/s                             SHUNTS                             Systemic VTI:  0.19 m                             Systemic Diam: 2.00 cm Kirk Ruths MD Electronically signed by Kirk Ruths MD Signature Date/Time: 08/30/2019/3:04:54 PM    Final         Scheduled Meds: . amLODipine  10 mg Oral Daily  . aspirin EC  81 mg Oral Daily  . benazepril  20 mg Oral Daily  . carvedilol  25 mg Oral BID WC  . Chlorhexidine Gluconate Cloth  6 each Topical Daily  .  chlorthalidone  12.5 mg Oral Daily  . donepezil  10 mg Oral QHS  . ezetimibe  10 mg Oral Daily  . fenofibrate  160 mg Oral Daily  . insulin aspart  0-5 Units Subcutaneous QHS  . insulin aspart  0-9 Units Subcutaneous TID WC  . isosorbide-hydrALAZINE  1 tablet Oral TID  . melatonin  2 mg Oral QHS  . pravastatin  80 mg Oral QHS  . QUEtiapine  25 mg Oral Daily  . QUEtiapine  50 mg Oral QHS  . senna  1 tablet Oral BID  . sodium chloride flush  3 mL Intravenous Q12H   Continuous Infusions: . sodium chloride Stopped (08/30/19 0911)  . ceFEPime (MAXIPIME) IV Stopped (08/31/19 1447)     LOS: 6 days  CRITICAL CARE Performed by: Cordelia Poche, MD   Total critical care time: 50 minutes  Critical care time was exclusive of separately billable procedures and treating other patients.  Critical care was necessary to treat or prevent imminent or life-threatening deterioration.  Critical care was time spent personally by me on the following activities: development of treatment plan with patient and/or surrogate as well as nursing, discussions with consultants, evaluation of patient's response to treatment, examination of patient, obtaining history from patient or surrogate, ordering and performing treatments and interventions, ordering and review of laboratory studies, ordering and review of radiographic studies, pulse oximetry and re-evaluation of patient's condition.   Cordelia Poche, MD [facility] 09/01/2019, 1:40 PM  If 7PM-7AM, please contact night-coverage [email or web address]

## 2019-09-01 NOTE — Progress Notes (Signed)
  Speech Language Pathology Treatment: Dysphagia  Patient Details Name: Natalie Rodgers MRN: [id number] DOB: Jul 02, 1940 Today's Date: 09/01/2019 Time: 1435-1500 SLP Time Calculation (min) (ACUTE ONLY): 25 min  Assessment / Plan / Recommendation Clinical Impression  Pt lying in bed with a tray of soup/soda at bedside. She awoke to SLP verbal/tactile stimulation to eat and once set up was able to self feed using her left hand.  Pt tends to drink liquids at rapid pace but overall was protective of her airway.  She demonstrated a single episode of coughing - x1/10 boluses - with liquids due to sequential large bolus swallows.  Pt states she enjoys eating a salad every day and given her respiratory status has improved thus swallow safety is better - will advance to regular/thin consistency to help maximize pt's intake. Pt enjoys taco salads - SLP passed on information to the RN.    Will follow up x1 to assure she is managing dietary advancement well.   Pt benefited from verbal cue to take small boluses and implemented this after reviewed.  She will however need intermittent supervision to assure tolerance of dietary advancement.     HPI HPI: Pt is 79 yo female with PMH including HTN, DM2, CKD, mild dementia, anemia, oncology care, hx of ICH with R hemiparesis 2016. Pt presented to ED with complaint of leg and foot pain.  Imaging of feet - negative for acute fx.  Pt admitted with SIRS (no clear source per note) and afib with RVR.    Swallow evaluation ordered due to pt overtly coughing with water today.  RN reports pt tolerated po medications well.  Pt was tachycardic and febrile upon admission.  Urinalysis was negative as well CXR.  Pt resides at [facility].  Per MD note, pt follows some directions and is disoriented to location.  Pt denies difficulty with swallowing.  She underwent a BSE and MBS, MBS was tolerated well without aspiration or penetration however pt later had an anaphalaxis episode and now adds  barium containing compounds to her allergy list.  She has been on a dys3/thin diet and tolerating.  Follow up to assure tolerance indication.      SLP Plan  Continue with current plan of care       Recommendations  Diet recommendations: Regular;Thin liquid Liquids provided via: Cup Medication Administration: Whole meds with puree Supervision: Patient able to self feed Compensations: Slow rate;Small sips/bites Postural Changes and/or Swallow Maneuvers: Seated upright 90 degrees;Upright 30-60 min after meal                Follow up Recommendations: None SLP Visit Diagnosis: Dysphagia, oral phase (R13.11) Plan: Continue with current plan of care       GO                Macario Golds 09/01/2019, 3:36 PM   Natalie Lime, MS [place] Office [phone]

## 2019-09-02 ENCOUNTER — Encounter (HOSPITAL_COMMUNITY): Payer: Self-pay | Admitting: Internal Medicine

## 2019-09-02 DIAGNOSIS — R6510 Systemic inflammatory response syndrome (SIRS) of non-infectious origin without acute organ dysfunction: Secondary | ICD-10-CM | POA: Diagnosis not present

## 2019-09-02 DIAGNOSIS — I10 Essential (primary) hypertension: Secondary | ICD-10-CM | POA: Diagnosis not present

## 2019-09-02 DIAGNOSIS — I4891 Unspecified atrial fibrillation: Secondary | ICD-10-CM | POA: Diagnosis not present

## 2019-09-02 DIAGNOSIS — N179 Acute kidney failure, unspecified: Secondary | ICD-10-CM | POA: Diagnosis not present

## 2019-09-02 DIAGNOSIS — A419 Sepsis, unspecified organism: Secondary | ICD-10-CM | POA: Diagnosis not present

## 2019-09-02 DIAGNOSIS — R52 Pain, unspecified: Secondary | ICD-10-CM | POA: Diagnosis not present

## 2019-09-02 DIAGNOSIS — M79671 Pain in right foot: Secondary | ICD-10-CM | POA: Diagnosis not present

## 2019-09-02 LAB — BASIC METABOLIC PANEL
Anion gap: 9 (ref 5–15)
BUN: 42 mg/dL — ABNORMAL HIGH (ref 8–23)
CO2: 25 mmol/L (ref 22–32)
Calcium: 8.7 mg/dL — ABNORMAL LOW (ref 8.9–10.3)
Chloride: 109 mmol/L (ref 98–111)
Creatinine, Ser: 1.38 mg/dL — ABNORMAL HIGH (ref 0.44–1.00)
GFR calc Af Amer: 42 mL/min — ABNORMAL LOW (ref >60)
GFR calc non Af Amer: 36 mL/min — ABNORMAL LOW (ref >60)
Glucose, Bld: 188 mg/dL — ABNORMAL HIGH (ref 70–99)
Potassium: 3.8 mmol/L (ref 3.5–5.1)
Sodium: 143 mmol/L (ref 135–145)

## 2019-09-02 LAB — GLUCOSE, CAPILLARY
Glucose-Capillary: 191 mg/dL — ABNORMAL HIGH (ref 70–99)
Glucose-Capillary: 348 mg/dL — ABNORMAL HIGH (ref 70–99)
Glucose-Capillary: 197 mg/dL — ABNORMAL HIGH (ref 70–99)
Glucose-Capillary: 224 mg/dL — ABNORMAL HIGH (ref 70–99)

## 2019-09-02 MED ORDER — DILTIAZEM HCL ER COATED BEADS 180 MG PO CP24: 180.0000 mg | ORAL_CAPSULE | Freq: Every day | ORAL | Status: DC

## 2019-09-02 MED ORDER — DILTIAZEM HCL ER COATED BEADS 180 MG PO CP24
180.0000 mg | ORAL_CAPSULE | Freq: Every day | ORAL | Status: DC
  Administered 2019-09-03: 180 mg via ORAL
  Filled 2019-09-02 (×2): qty 1

## 2019-09-02 NOTE — Progress Notes (Signed)
PROGRESS NOTE    Natalie Rodgers  GNO:[id number] DOB: [birth year] DOA: 08/15/2019 PCP: Welford Roche, NP   Brief Narrative: Natalie Rodgers is a 79 y.o. female with PMH significant for HTN, DM2 not on insulin, CKD, dementia, chronic anemia. Patient presented secondary to right leg pain. She resides at [facility]. She was found to be febrile on admission with associated evidence concerning for SIRS with unknown source. She was started on empiric antibiotics. She also developed atrial fibrillation with RVR and started on Diltiazem IV.   Assessment & Plan:   Principal Problem:   SIRS (systemic inflammatory response syndrome) (HCC) Active Problems:   Atrial fibrillation with RVR (HCC)   AKI (acute kidney injury) ([place])   Type 2 diabetes mellitus with hyperlipidemia (HCC)   Essential hypertension   Dementia without behavioral disturbance (HCC)   Bilateral foot pain   Sepsis Source of bilateral foot cellulitis and myositis. Present on admission. Does not meet sepsis criteria. SIRS criteria met on admission. Urine culture with no growth (final); blood cultures with no growth to date. COVID-19 and influenza negative. Chest x-ray without acute abnormalities. Left foot x-ray with possible evidence of infection. Empirically started on Vancomycin and Cefepime. Now on Cefepime. Blood cultures no growth to date.  Paroxysmal atrial fibrillation with RVR Initially started on Diltiazem drip which is now discontinued. Currently in sinus rhythm. On Coreg and aspirin as an outpatient. CHA2DS2-VASc Score is 7. Patient with a history of intracranial hemorrhage. Patient reentered RVR on 4/3 secondary to discontinuation of Coreg and is now back in sinus rhythm -Continue Coreg -Cardiology recommendations for management and anticoagulation recommendations in setting of history of hemorrhagic stroke  Anaphylactic shock Unknown etiology. Possibly related to barium vs food ingestion. Patient had stridor and  hypotension requiring racemic epinephrine, solu-medrol, Pepcid, benadryl. Patient required norepinephrine for blood pressure support which is now discontinued. Barium added as allergy to patient's chart. Resolved.  Foot cellulitis MRI significant for cellulitis with mild myositis of both feet. No external evidence noted. Improved but still with some pain. -Continue Tylenol prn -Cefdinir (renally dosed). Will treat for 10-14 days based on symptoms -Voltaren gel for pain -PT/OT recommendations: SNF  Leukocytosis Secondary to infection initially and trended down. Now elevated in setting of steroid use. Doubt related to infection. Stable.  Mild right-sided rales Possible aspiration. Clear chest x-ray on admission. Repeat chest x-ray without evidence of infiltrate. Resolved.  CKD stage IV Creatinine of 2.02 on admission. Baseline creatinine of 2.2 per chart review on linked chart and review of outside records. Patient does not have acute kidney injury but stable CKD. Creatinine is below baseline at this time at 1.38 today.  Diabetes mellitus, type 2 Hemoglobin A1C of 7%. Patient is on glipizide as an out patient. -Continue SSI  Essential hypertension Uncontrolled in setting of holding antihypertensives from shock. -Continue Coreg, BiDil and amlodipine, benazepril and chlorthalidone -Holding clonidine with continued control of blood pressure. Recommend discontinuing clonidine on discharge if BP remains stable  Hyperlipidemia -Continue Zetia, pravastatin, fenofibrate  Dementia -Continue Aricept  Abdominal distension Improved. Patient with multiple bowel movements.  Dysphagia SLP consulted and barium swallow performed 4/1. Heart healthy/carb modified diet started 4/6   Recommendations (09/01/19)  Diet recommendations: Regular;Thin liquid Liquids provided via: Cup Medication Administration: Whole meds with puree Supervision: Patient able to self feed Compensations: Slow rate;Small  sips/bites Postural Changes and/or Swallow Maneuvers: Seated upright 90 degrees;Upright 30-60 min after meal        DVT prophylaxis: SCDs Code Status:  Code Status: Full Code Family Communication: None at bedside Disposition Plan: Discharge to SNF when bed available, pending cardiology consult, PT/OT   Consultants:   CCM  Cardiology  Procedures:   None  Antimicrobials:  Vancomycin  Cefepime    Subjective: Mild foot pain. No dyspnea or chest pain.  Objective: Vitals:   09/01/19 0526 09/01/19 1509 09/01/19 2011 09/02/19 0407  BP: (!) 149/79 139/83 114/67 122/69  Pulse: 82 81 76 78  Resp:  (!) _0 Temp:  98.3 F (36.8 C) 99.2 F (37.3 C) 97.6 F (36.4 C)  TempSrc:  Oral    SpO2: 97% 98% 97% 94%  Weight:      Height:        Intake/Output Summary (Last 24 hours) at 09/02/2019 1032 Last data filed at 09/01/2019 1815 Gross per 24 hour  Intake 1180 ml  Output 1600 ml  Net -420 ml   Filed Weights   08/13/2019 1537 08/27/19 0135  Weight: 83.9 kg 67.8 kg    Examination:  General exam: Appears calm and comfortable Respiratory system: Clear to auscultation. Respiratory effort normal. Cardiovascular system: S1 & S2 heard, RRR. No murmurs, rubs, gallops or clicks. Gastrointestinal system: Abdomen is nondistended, soft and nontender. No organomegaly or masses felt. Normal bowel sounds heard. Central nervous system: Alert and oriented to self.  Extremities: No edema. No calf tenderness Skin: No cyanosis. No rashes Psychiatry: Judgement and insight appear impaired. Pleasant. Full affect.    Data Reviewed: I have personally reviewed following labs and imaging studies  CBC: Recent Labs  Lab 08/09/2019 1405 08/17/2019 1405 08/27/19 0256 08/28/19 0237 08/29/19 0301 08/30/19 0300 09/01/19 0534  WBC 14.9*   < > 13.3* 11.3* 11.9* 16.6* 12.5*  NEUTROABS 13.0*  --   --   --   --   --   --   HGB 12.5   < > 10.7* 9.3* 10.1* 10.0* 10.1*  HCT 40.0   < > 34.5*  30.4* 32.2* 31.6* 31.9*  MCV 95.2   < > 98.0 97.7 96.7 95.2 94.9  PLT 300   < > 223 195 230 301 301   < > = values in this interval not displayed.   Basic Metabolic Panel: Recent Labs  Lab 08/27/19 0256 08/28/19 0237 08/29/19 0301 08/30/19 0300 08/30/19 1818 09/02/19 0518  NA 139 140 140 142  --  143  K 4.1 3.6 3.9 3.8  --  3.8  CL 110 114* 113* 112*  --  109  CO2 19* 18* 18* 20*  --  25  GLUCOSE 188* 160* 322* 258*  --  188*  BUN 30* 33* 40* 51*  --  42*  CREATININE 1.74* 1.83* 1.97* 1.87*  --  1.38*  CALCIUM 7.9* 7.4* 7.9* 8.5*  --  8.7*  MG  --   --   --   --  2.0  --    GFR: Estimated Creatinine Clearance: 27.9 mL/min (A) (by C-G formula based on SCr of 1.38 mg/dL (H)). Liver Function Tests: Recent Labs  Lab 08/11/2019 1405  AST 51*  ALT 42  ALKPHOS 60  BILITOT 0.9  PROT 8.6*  ALBUMIN 4.7   No results for input(s): LIPASE, AMYLASE in the last 168 hours. No results for input(s): AMMONIA in the last 168 hours. Coagulation Profile: Recent Labs  Lab 08/25/2019 1405  INR 0.9   Cardiac Enzymes: No results for input(s): CKTOTAL, CKMB, CKMBINDEX, TROPONINI in the last 168 hours. BNP (last 3 results) No results  for input(s): PROBNP in the last 8760 hours. HbA1C: No results for input(s): HGBA1C in the last 72 hours. CBG: Recent Labs  Lab 09/01/19 0734 09/01/19 1123 09/01/19 1646 09/01/19 2039 09/02/19 0735  GLUCAP 176* 331* 294* 196* 191*   Lipid Profile: No results for input(s): CHOL, HDL, LDLCALC, TRIG, CHOLHDL, LDLDIRECT in the last 72 hours. Thyroid Function Tests: Recent Labs    08/30/19 1818  TSH 1.718   Anemia Panel: No results for input(s): VITAMINB12, FOLATE, FERRITIN, TIBC, IRON, RETICCTPCT in the last 72 hours. Sepsis Labs: Recent Labs  Lab 08/09/2019 1405 08/25/2019 1812  LATICACIDVEN 2.4* 2.0*    Recent Results (from the past 240 hour(s))  Blood culture (routine x 2)     Status: None   Collection Time: 08/10/2019  2:05 PM   Specimen:  BLOOD  Result Value Ref Range Status   Specimen Description   Final    BLOOD LEFT ANTECUBITAL Performed at [facility] [address] [place], [place] [address]    Special Requests   Final    BOTTLES DRAWN AEROBIC AND ANAEROBIC Blood Culture adequate volume Performed at [facility] [address] [place], [place] [address]    Culture   Final    NO GROWTH 5 DAYS Performed at [facility], [place] [address], [place], [place] [address]    Report Status 08/31/2019 FINAL  Final  Respiratory Panel by RT PCR (Flu A&B, Covid) - Nasopharyngeal Swab     Status: None   Collection Time: 08/15/2019  2:18 PM   Specimen: Nasopharyngeal Swab  Result Value Ref Range Status   SARS Coronavirus 2 by RT PCR NEGATIVE NEGATIVE Final    Comment: (NOTE) SARS-CoV-2 target nucleic acids are NOT DETECTED. The SARS-CoV-2 RNA is generally detectable in upper respiratoy specimens during the acute phase of infection. The lowest concentration of SARS-CoV-2 viral copies this assay can detect is 131 copies/mL. A negative result does not preclude SARS-Cov-2 infection and should not be used as the sole basis for treatment or other patient management decisions. A negative result may occur with  improper specimen collection/handling, submission of specimen other than nasopharyngeal swab, presence of viral mutation(s) within the areas targeted by this assay, and inadequate number of viral copies (<131 copies/mL). A negative result must be combined with clinical observations, patient history, and epidemiological information. The expected result is Negative. Fact Sheet for Patients:  PinkCheek.be Fact Sheet for Healthcare Providers:  GravelBags.it This test is not yet ap proved or cleared by the [place] FDA and  has been authorized for detection and/or diagnosis of SARS-CoV-2 by FDA under an Emergency Use  Authorization (EUA). This EUA will remain  in effect (meaning this test can be used) for the duration of the COVID-19 declaration under Section 564(b)(1) of the Act, 21 U.S.C. section 360bbb-3(b)(1), unless the authorization is terminated or revoked sooner.    Influenza A by PCR NEGATIVE NEGATIVE Final   Influenza B by PCR NEGATIVE NEGATIVE Final    Comment: (NOTE) The Xpert Xpress SARS-CoV-2/FLU/RSV assay is intended as an aid in  the diagnosis of influenza from Nasopharyngeal swab specimens and  should not be used as a sole basis for treatment. Nasal washings and  aspirates are unacceptable for Xpert Xpress SARS-CoV-2/FLU/RSV  testing. Fact Sheet for Patients: PinkCheek.be Fact Sheet for Healthcare Providers: GravelBags.it This test is not yet approved or cleared by the [place] FDA and  has been authorized for detection and/or diagnosis of SARS-CoV-2  by  FDA under an Emergency Use Authorization (EUA). This EUA will remain  in effect (meaning this test can be used) for the duration of the  Covid-19 declaration under Section 564(b)(1) of the Act, 21  U.S.C. section 360bbb-3(b)(1), unless the authorization is  terminated or revoked. Performed at [facility], [place] [address] [place], [place] [address]   Blood culture (routine x 2)     Status: None   Collection Time: 08/09/2019  2:37 PM   Specimen: BLOOD  Result Value Ref Range Status   Specimen Description   Final    BLOOD RIGHT WRIST Performed at [facility] [address], [place], [place] [address]    Special Requests   Final    BOTTLES DRAWN AEROBIC ONLY Blood Culture results may not be optimal due to an inadequate volume of blood received in culture bottles Performed at [facility] [address] [place], [place] [address]    Culture   Final    NO GROWTH 5 DAYS Performed at [facility],  [place] [address], [place], [place] [address]    Report Status 08/31/2019 FINAL  Final  Urine culture     Status: None   Collection Time: 08/19/2019  3:19 PM   Specimen: Urine, Catheterized  Result Value Ref Range Status   Specimen Description   Final    URINE, CATHETERIZED Performed at [facility] [address], [place], [place] [address]    Special Requests   Final    NONE Performed at [facility], [place] [address] [place], [place] [address]    Culture   Final    NO GROWTH Performed at [facility], [place] [address] [place], [place] [address]    Report Status 08/27/2019 FINAL  Final  MRSA PCR Screening     Status: None   Collection Time: 08/27/19  1:46 AM   Specimen: Nasal Mucosa; Nasopharyngeal  Result Value Ref Range Status   MRSA by PCR NEGATIVE NEGATIVE Final    Comment:        The GeneXpert MRSA Assay (FDA approved for NASAL specimens only), is one component of a comprehensive MRSA colonization surveillance program. It is not intended to diagnose MRSA infection nor to guide or monitor treatment for MRSA infections. Performed at [facility], [place] [address], [place],  [address]          Radiology Studies: No results found.      Scheduled Meds: . amLODipine  10 mg Oral Daily  . aspirin EC  81 mg Oral Daily  . benazepril  20 mg Oral Daily  . carvedilol  25 mg Oral BID WC  . cefdinir  300 mg Oral Daily  . Chlorhexidine Gluconate Cloth  6 each Topical Daily  . chlorthalidone  12.5 mg Oral Daily  . diclofenac Sodium  4 g Topical QID  . donepezil  10 mg Oral QHS  . ezetimibe  10 mg Oral Daily  . fenofibrate  160 mg Oral Daily  . insulin aspart  0-5 Units Subcutaneous QHS  . insulin aspart  0-9 Units Subcutaneous TID WC  . isosorbide-hydrALAZINE  1 tablet Oral TID  . melatonin  2 mg Oral QHS  . pravastatin  80 mg Oral QHS  . QUEtiapine  25 mg Oral Daily  . QUEtiapine  50 mg Oral QHS  .  senna  1 tablet Oral BID  . sodium chloride flush  3 mL Intravenous Q12H   Continuous Infusions: . sodium chloride Stopped (08/30/19 0911)     LOS: 7 days  CRITICAL CARE Performed by: Cordelia Poche, MD   Total critical care time: 50 minutes  Critical care time was exclusive of separately billable procedures and treating other patients.  Critical care was necessary to treat or prevent imminent or life-threatening deterioration.  Critical care was time spent personally by me on the following activities: development of treatment plan with patient and/or surrogate as well as nursing, discussions with consultants, evaluation of patient's response to treatment, examination of patient, obtaining history from patient or surrogate, ordering and performing treatments and interventions, ordering and review of laboratory studies, ordering and review of radiographic studies, pulse oximetry and re-evaluation of patient's condition.   Cordelia Poche, MD [facility] 09/02/2019, 10:32 AM  If 7PM-7AM, please contact night-coverage [email or web address]

## 2019-09-02 NOTE — Progress Notes (Signed)
Discussed history of hemorrhagic stroke with on-call neurology. Recommendation for MRI of brain w/o contrast prior to making recommendations for anticoagulation. Recommendation to re-consult neurology once MRI brain is available.  Cordelia Poche, MD [facility] 09/02/2019, 7:07 PM

## 2019-09-02 NOTE — TOC Progression Note (Addendum)
Transition of Care [facility]) - Progression Note    Patient Details  Name: Aamirah Salmi MRN: [id number] Date of Birth: 08-15-1940  Transition of Care [facility]) CM/SW [place], [place] Phone Number: 09/02/2019, 10:14 AM  Clinical Narrative:   Spoke to daughter an updated her on OT note that is also recommending SNF.  Explained the process to daughter, highlighting MCD related restrictions of 30 day commitment and possible forfeiture of check.  Daughter voiced understanding.  Will send out bed search today. TOC will continue to follow during the course of hospitalization.  Addendum:  PASSR requires additional information due to dementia dx.   Spoke with daughter and gave her list of 6 bed offers.  She will do more research, let me know in AM at latest which she would like to go with.  AddendumII:  Daughter chooses [facility].  Facility alerted.     Expected Discharge Plan: Skilled Nursing Facility(vs. return to ALF) Barriers to Discharge: SNF Pending bed offer  Expected Discharge Plan and Services Expected Discharge Plan: Skilled Nursing Facility(vs. return to ALF) In-house Referral: Clinical Social Work     Living arrangements for the past 2 months: Assisted Living Facility([facility])                                       Social Determinants of Health (SDOH) Interventions    Readmission Risk Interventions Readmission Risk Prevention Plan 09/01/2019  Transportation Screening Complete  Palliative Care Screening Not Applicable

## 2019-09-02 NOTE — Plan of Care (Signed)
Called by Dr. Lonny Prude regarding patient being recommended for anticoagulation for atrial fibrillation by cardiology-and needing input from a neurological standpoint on safety of anticoagulation in the setting of remote ICH.    Not admitted for a neurological issue at this time.  Actively being treated for SIRS, with history of atrial fibrillation with RVR, currently sinus.  Has a history of intracerebral hemorrhage in 2016.  No records available in either our medical record or care everywhere.  I have recommended that an MRI of the brain without contrast be done and then call us back so that we can look at imaging and be of more assistance at that time.  Please call neurology after the MRI of the brain has been completed.  We can review imaging with the primary team at the time.  -- Amie [place], MD [facility] Pager: [phone] If 7pm to 7am, please call on call as listed on AMION.

## 2019-09-02 NOTE — Progress Notes (Signed)
Physical Therapy Treatment Patient Details Name: Natalie Rodgers MRN: [id number] DOB: 12-Aug-1940 Today's Date: 09/02/2019    History of Present Illness Pt is 79 yo female with PMH including HTN, DM2, CKD, mild dementia, anemia, hx of CVA with R hemiparesis. Pt presented to ED with complaint of leg and foot pain.  Imaging of feet - negative for acute fx.  Pt admitted with SIRS (no clear source per note) and afib with RVR.    PT Comments    Pt agreeable to working with PT. Pt soiled bed so assisted with clean up and replacing bed pad. Then assisted pt to sit up EOB. Pt sat for a few minutes. She c/o some lightheadedness. She declined attempt at standing. Assisted pt back to bed at her request. Will continues to follow and progress activity as tolerated.     Follow Up Recommendations  SNF     Equipment Recommendations       Recommendations for Other Services       Precautions / Restrictions Precautions Precautions: Fall Precaution Comments: painful feet Restrictions Weight Bearing Restrictions: No    Mobility  Bed Mobility Overal bed mobility: Needs Assistance Bed Mobility: Rolling;Supine to Sit;Sit to Supine Rolling: Mod assist   Supine to sit: Mod assist Sit to supine: Mod assist   General bed mobility comments: Increased time. Assist for trunk and bil LEs. Multimodal cueing for safety, technique. Pt sat EOB for ~3 minutes then returned to supine.  Transfers                 General transfer comment: NT-pt declined attempt at standing on today  Ambulation/Gait                 Stairs             Wheelchair Mobility    Modified Rankin (Stroke Patients Only)       Balance Overall balance assessment: Needs assistance   Sitting balance-Leahy Scale: Fair                                      Cognition Arousal/Alertness: Awake/alert Behavior During Therapy: WFL for tasks assessed/performed Overall Cognitive Status: Difficult  to assess                                        Exercises      General Comments        Pertinent Vitals/Pain Pain Assessment: Faces Faces Pain Scale: Hurts little more Pain Location: feet Pain Descriptors / Indicators: Grimacing;Moaning;Discomfort Pain Intervention(s): Limited activity within patient's tolerance;Monitored during session;Repositioned    Home Living                      Prior Function            PT Goals (current goals can now be found in the care plan section) Progress towards PT goals: Progressing toward goals    Frequency    Min 2X/week      PT Plan Current plan remains appropriate    Co-evaluation              AM-PAC PT "6 Clicks" Mobility   Outcome Measure  Help needed turning from your back to your side while in a flat bed without using bedrails?: A Lot Help needed  moving from lying on your back to sitting on the side of a flat bed without using bedrails?: A Lot Help needed moving to and from a bed to a chair (including a wheelchair)?: Total Help needed standing up from a chair using your arms (e.g., wheelchair or bedside chair)?: Total Help needed to walk in hospital room?: Total Help needed climbing 3-5 steps with a railing? : Total 6 Click Score: 8    End of Session   Activity Tolerance: Patient limited by pain;Patient limited by fatigue Patient left: in bed;with call bell/phone within reach;with bed alarm set   PT Visit Diagnosis: Pain;Muscle weakness (generalized) (M62.81);Unsteadiness on feet (R26.81)     Time: 9532-0233 PT Time Calculation (min) (ACUTE ONLY): 12 min  Charges:  $Therapeutic Activity: 8-22 mins                        Doreatha Massed, PT Acute Rehabilitation

## 2019-09-02 NOTE — [place] FL2 (Signed)
[place] MEDICAID FL2 LEVEL OF CARE SCREENING TOOL     IDENTIFICATION  Patient Name: Natalie Rodgers Birthdate: 11/02/1940 Sex: female Admission Date (Current Location): 07/31/2019  [facility] and [place] Number:  Herbalist and Address:  [facility],  [place], [place]      Provider Number: [id number]  Attending Physician Name and Address:  Mariel Aloe, MD  Relative Name and Phone Number:       Current Level of Care: Hospital Recommended Level of Care: [place] Prior Approval Number:    Date Approved/Denied:   PASRR Number:    Discharge Plan: SNF    Current Diagnoses: Patient Active Problem List   Diagnosis Date Noted  . Atrial fibrillation with RVR ([place]) 08/27/2019  . AKI (acute kidney injury) ([place]) 08/27/2019  . Type 2 diabetes mellitus with hyperlipidemia ([place]) 08/27/2019  . Essential hypertension 08/27/2019  . Dementia without behavioral disturbance ([place]) 08/27/2019  . Bilateral foot pain 08/27/2019  . SIRS (systemic inflammatory response syndrome) ([place]) 08/16/2019    Orientation RESPIRATION BLADDER Height & Weight     Self, Place  Normal External catheter Weight: 67.8 kg Height:  5' (152.4 cm)  BEHAVIORAL SYMPTOMS/MOOD NEUROLOGICAL BOWEL NUTRITION STATUS  (none) (none) Continent Diet(see d/c summary)  AMBULATORY STATUS COMMUNICATION OF NEEDS Skin   Extensive Assist Verbally Normal                       Personal Care Assistance Level of Assistance  Bathing, Feeding, Dressing Bathing Assistance: Maximum assistance Feeding assistance: Independent Dressing Assistance: Maximum assistance     Functional Limitations Info  Sight, Hearing, Speech Sight Info: Adequate Hearing Info: Adequate Speech Info: Adequate    SPECIAL CARE FACTORS FREQUENCY  PT (By licensed PT), OT (By licensed OT)     PT Frequency: 5X/W OT Frequency: 5X/W            Contractures Contractures Info: Not  present    Additional Factors Info  Code Status, Allergies Code Status Info: full Allergies Info: barium-containing compounds           Current Medications (09/02/2019):  This is the current hospital active medication list Current Facility-Administered Medications  Medication Dose Route Frequency Provider Last Rate Last Admin  . 0.9 %  sodium chloride infusion   Intravenous PRN Mariel Aloe, MD   Stopped at 08/30/19 0911  . acetaminophen (TYLENOL) tablet 650 mg  650 mg Oral Q6H PRN Terrilee Croak, MD   650 mg at 08/27/19 2329   Or  . acetaminophen (TYLENOL) suppository 650 mg  650 mg Rectal Q6H PRN Terrilee Croak, MD   650 mg at 08/27/19 0556  . albuterol (PROVENTIL) (2.5 MG/3ML) 0.083% nebulizer solution 2.5 mg  2.5 mg Nebulization Q6H PRN Noemi Chapel P, DO   2.5 mg at 08/29/19 1337  . amLODipine (NORVASC) tablet 10 mg  10 mg Oral Daily Mariel Aloe, MD   10 mg at 09/01/19 1100  . aspirin EC tablet 81 mg  81 mg Oral Daily Dahal, Marlowe Aschoff, MD   81 mg at 09/01/19 1101  . benazepril (LOTENSIN) tablet 20 mg  20 mg Oral Daily Mariel Aloe, MD   20 mg at 09/01/19 1101  . carvedilol (COREG) tablet 25 mg  25 mg Oral BID WC Eugenie Filler, MD   25 mg at 09/01/19 1721  . cefdinir (OMNICEF) capsule 300 mg  300 mg Oral Daily Mariel Aloe, MD  300 mg at 09/01/19 1721  . Chlorhexidine Gluconate Cloth 2 % PADS 6 each  6 each Topical Daily Dahal, Marlowe Aschoff, MD   6 each at 09/01/19 0806  . chlorthalidone (HYGROTON) tablet 12.5 mg  12.5 mg Oral Daily Mariel Aloe, MD   12.5 mg at 09/01/19 1102  . diclofenac Sodium (VOLTAREN) 1 % topical gel 4 g  4 g Topical QID Mariel Aloe, MD   4 g at 09/01/19 2336  . diphenhydrAMINE (BENADRYL) injection 50 mg  50 mg Intravenous Q6H PRN Mariel Aloe, MD      . donepezil (ARICEPT) tablet 10 mg  10 mg Oral QHS Terrilee Croak, MD   10 mg at 09/01/19 2336  . ezetimibe (ZETIA) tablet 10 mg  10 mg Oral Daily Dahal, Binaya, MD   10 mg at 09/01/19 1101  .  fenofibrate tablet 160 mg  160 mg Oral Daily Dahal, Marlowe Aschoff, MD   160 mg at 09/01/19 1102  . hydrALAZINE (APRESOLINE) tablet 25 mg  25 mg Oral Q6H PRN Nicole Kindred A, DO   25 mg at 08/30/19 0233  . insulin aspart (novoLOG) injection 0-5 Units  0-5 Units Subcutaneous QHS Terrilee Croak, MD   3 Units at 08/29/19 2138  . insulin aspart (novoLOG) injection 0-9 Units  0-9 Units Subcutaneous TID WC Terrilee Croak, MD   5 Units at 09/01/19 1722  . isosorbide-hydrALAZINE (BIDIL) 20-37.5 MG per tablet 1 tablet  1 tablet Oral TID Mariel Aloe, MD   1 tablet at 09/01/19 2335  . labetalol (NORMODYNE) injection 10 mg  10 mg Intravenous Q4H PRN Nicole Kindred A, DO   10 mg at 08/30/19 0343  . magnesium hydroxide (MILK OF MAGNESIA) suspension 30 mL  30 mL Oral Daily PRN Dahal, Binaya, MD      . melatonin tablet 2 mg  2 mg Oral QHS Mariel Aloe, MD   2 mg at 09/01/19 2335  . ondansetron (ZOFRAN) tablet 4 mg  4 mg Oral Q6H PRN Dahal, Binaya, MD       Or  . ondansetron (ZOFRAN) injection 4 mg  4 mg Intravenous Q6H PRN Dahal, Binaya, MD      . pravastatin (PRAVACHOL) tablet 80 mg  80 mg Oral QHS Terrilee Croak, MD   80 mg at 09/01/19 2335  . QUEtiapine (SEROQUEL) tablet 25 mg  25 mg Oral Daily Dahal, Binaya, MD   25 mg at 09/01/19 1100  . QUEtiapine (SEROQUEL) tablet 50 mg  50 mg Oral QHS Terrilee Croak, MD   50 mg at 09/01/19 2336  . Racepinephrine HCl 2.25 % nebulizer solution 0.5 mL  0.5 mL Nebulization Q2H PRN Julian Hy, DO      . senna (SENOKOT) tablet 8.6 mg  1 tablet Oral BID Terrilee Croak, MD   8.6 mg at 08/31/19 2223  . sodium chloride flush (NS) 0.9 % injection 3 mL  3 mL Intravenous Q12H Dahal, Binaya, MD   3 mL at 08/31/19 1102  . sodium phosphate (FLEET) 7-19 GM/118ML enema 1 enema  1 enema Rectal Once PRN Dahal, Binaya, MD      . sorbitol 70 % solution 30 mL  30 mL Oral Daily PRN Dahal, Marlowe Aschoff, MD         Discharge Medications: Please see discharge summary for a list of discharge  medications.  Relevant Imaging Results:  Relevant Lab Results:   Additional Information [name]  [place], [place]

## 2019-09-02 NOTE — Consult Note (Addendum)
Cardiology Consultation:   Patient ID: Natalie Rodgers MRN: [id number]; DOB: Dec 30, 1940  Admit date: 07/28/2019 Date of Consult: 09/02/2019  Primary Care Provider: Mikael Spray, NP Primary Cardiologist: Armanda Magic, MD  Primary Electrophysiologist:  None    Patient Profile:   Natalie Rodgers is a 79 y.o. 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 Caleb Popp.  History of Present Illness:  History obtained through chart review given dementia.  Natalie Rodgers seen by Dr. Jens Som back in 2013 for abnormal heart rhythm the setting of s/p hysterectomy with post operative ileus. EKG showed sinus tach with PACs. She was started on a beta-blocker and was not seen back in follow-up. No history of MI, stent, or heart failure. She follows with [facility] for CKD. Does not appear to have h/o of tobacco/alcohol/drug use.   Patient was admitted to the hospital 08/22/2019 [facility] nursing facility planing of leg pain and right-sided pain.  Arrival to the ED patient was febrile and tachycardic to have leukocytosis and lactic acidosis with a creatinine of 2. The patient was started on empiric antibiotic antibiotics and IV dilt for A. Fib RVR.  Patient was admitted for further work-up. Cardiology was consulted for atrial fibrillation with RVR.  EKG 08/30/19 with A. Fib RVR heart rate 169 Labs:  Potassium 3.8 Sodium 143 Magnesium 2.0 Creatinine 1.38 WBC 12.5 Hemoglobin 10.1 TSH 1.7  On my interview patient is pleasantly demented, A&O x1. She is unaware what brought her to the hospital. She denies chest pain, palpitations, or shortness of breath. Appears euvolemic on exam. Telemetry shows she is in sinus rhythm.   Past Medical History:  Diagnosis Date  . A-fib (HCC)   .  Chronic kidney disease    CKD Stage IV  . Dementia (HCC)   . Diabetes mellitus without complication (HCC)   . GERD (gastroesophageal reflux disease)   . Hyperlipidemia   . Hypertension     Past Surgical History:  Procedure Laterality Date  . ABDOMINAL HYSTERECTOMY     salpinoophorectomy  . LAPAROTOMY       Home Medications:  Prior to Admission medications   Medication Sig Start Date End Date Taking? Authorizing Provider  amLODipine-benazepril (LOTREL) 10-20 MG capsule Take 1 capsule by mouth daily.   Yes [provider]  aspirin EC 81 MG tablet Take 81 mg by mouth daily.   Yes [provider]  carvedilol (COREG) 25 MG tablet Take 25 mg by mouth 2 (two) times daily with a meal.   Yes [provider]  chlorthalidone (HYGROTON) 25 MG tablet Take 12.5 mg by mouth daily.   Yes [provider]  cloNIDine (CATAPRES) 0.1 MG tablet Take 0.1 mg by mouth 2 (two) times daily.   Yes [provider]  Cranberry 450 MG TABS Take 450 mg by mouth daily.   Yes [provider]  donepezil (ARICEPT) 10 MG tablet Take 10 mg by mouth at bedtime.   Yes [provider]  ezetimibe (ZETIA) 10 MG tablet Take 10 mg by mouth daily.   Yes [provider]  famotidine (PEPCID) 20 MG tablet Take 20 mg by mouth at bedtime.   Yes [provider]  fenofibrate 160 MG tablet Take 160 mg by mouth daily.   Yes [provider]  glipiZIDE (GLUCOTROL)  10 MG tablet Take 10 mg by mouth daily before breakfast.   Yes [provider]  isosorbide-hydrALAZINE (BIDIL) 20-37.5 MG tablet Take 1 tablet by mouth 3 (three) times daily.   Yes [provider]  melatonin 1 MG TABS tablet Take 2 mg by mouth at bedtime.   Yes [provider]  pravastatin (PRAVACHOL) 80 MG tablet Take 80 mg by mouth at bedtime.   Yes [provider]  QUEtiapine (SEROQUEL) 25 MG tablet Take 25 mg by mouth daily.   Yes [provider]  QUEtiapine (SEROQUEL) 50 MG tablet Take 50 mg by mouth at bedtime.   Yes [provider]  sitaGLIPtin (JANUVIA) 25 MG tablet Take 25 mg by mouth daily.   Yes [provider]  Vitamin D3 (VITAMIN D) 25 MCG tablet Take 2,000 Units by mouth daily.   Yes [provider]    Inpatient Medications: Scheduled Meds: . amLODipine  10 mg Oral Daily  . aspirin EC  81 mg Oral Daily  . benazepril  20 mg Oral Daily  . carvedilol  25 mg Oral BID WC  . cefdinir  300 mg Oral Daily  . Chlorhexidine Gluconate Cloth  6 each Topical Daily  . chlorthalidone  12.5 mg Oral Daily  . diclofenac Sodium  4 g Topical QID  . donepezil  10 mg Oral QHS  . ezetimibe  10 mg Oral Daily  . fenofibrate  160 mg Oral Daily  . insulin aspart  0-5 Units Subcutaneous QHS  . insulin aspart  0-9 Units Subcutaneous TID WC  . isosorbide-hydrALAZINE  1 tablet Oral TID  . melatonin  2 mg Oral QHS  . pravastatin  80 mg Oral QHS  . QUEtiapine  25 mg Oral Daily  . QUEtiapine  50 mg Oral QHS  . senna  1 tablet Oral BID  . sodium chloride flush  3 mL Intravenous Q12H   Continuous Infusions: . sodium chloride Stopped (08/30/19 0911)   PRN Meds: sodium chloride, acetaminophen **OR** acetaminophen, albuterol, diphenhydrAMINE, hydrALAZINE, labetalol, magnesium hydroxide, ondansetron **OR** ondansetron (ZOFRAN) IV, Racepinephrine HCl, sodium phosphate, sorbitol  Allergies:    Allergies  Allergen Reactions  . Barium-Containing Compounds Anaphylaxis    Stridor, shock    Social History:   Social History   Socioeconomic History  . Marital status: Divorced    Spouse name: Not on file  . Number of children: Not on file  . Years of education: Not on file  . Highest education level: Not on file  Occupational History  . Not on file  Tobacco Use  . Smoking status: Never Smoker  . Smokeless tobacco: Never Used  Substance and Sexual Activity  . Alcohol use: Never  . Drug use: Never  . Sexual  activity: Not on file  Other Topics Concern  . Not on file  Social History Narrative  . Not on file   Social Determinants of Health   Financial Resource Strain:   . Difficulty of Paying Living Expenses:   Food Insecurity:   . Worried About Programme researcher, broadcasting/film/video in the Last Year:   . Barista in the Last Year:   Transportation Needs:   . Freight forwarder (Medical):   Marland Kitchen Lack of Transportation (Non-Medical):   Physical Activity:   . Days of Exercise per Week:   . Minutes of Exercise per Session:   Stress:   . Feeling of Stress :   Social Connections:   . Frequency of  Communication with Friends and Family:   . Frequency of Social Gatherings with Friends and Family:   . Attends Religious Services:   . Active Member of Clubs or Organizations:   . Attends Banker Meetings:   Marland Kitchen Marital Status:   Intimate Partner Violence:   . Fear of Current or Ex-Partner:   . Emotionally Abused:   Marland Kitchen Physically Abused:   . Sexually Abused:     Family History:   History reviewed. No pertinent family history.   ROS:  Please see the history of present illness.  All other ROS reviewed and negative.     Physical Exam/Data:   Vitals:   09/01/19 0526 09/01/19 1509 09/01/19 2011 09/02/19 0407  BP: (!) 149/79 139/83 114/67 122/69  Pulse: 82 81 76 78  Resp:  (!) 22 20 16   Temp:  98.3 F (36.8 C) 99.2 F (37.3 C) 97.6 F (36.4 C)  TempSrc:  Oral    SpO2: 97% 98% 97% 94%  Weight:      Height:        Intake/Output Summary (Last 24 hours) at 09/02/2019 1043 Last data filed at 09/01/2019 1815 Gross per 24 hour  Intake 1180 ml  Output 1600 ml  Net -420 ml   Last 3 Weights 08/27/2019 08/21/2019  Weight (lbs) 149 lb 7.6 oz 185 lb  Weight (kg) 67.8 kg 83.915 kg     Body mass index is 29.19 kg/m.  General:  Well nourished, well developed, in no acute distress HEENT: normal Lymph: no adenopathy Neck: no JVD Endocrine:  No thryomegaly Vascular: No carotid bruits; FA  pulses 2+ bilaterally without bruits  Cardiac:  normal S1, S2; RRR; no murmur  Lungs: diffusely diminished breath sounds Abd: soft, nontender, no hepatomegaly  Ext: no edema Musculoskeletal:  No deformities, BUE and BLE strength normal and equal Skin: warm and dry  Neuro:  CNs 2-12 intact, no focal abnormalities noted Psych:  Normal affect   EKG:  The EKG was personally reviewed and demonstrates:  Pending.  Telemetry:  Telemetry was personally reviewed and demonstrates: 4/3 patient converted to Afib rates up to 160s 4/4 converted to sinus  4/6 remains in sinus rhythm rates 70-80s  Relevant CV Studies:  Echo 08/30/2019 1. Hyperdynamic LV systolic function; grade 1 diastolic dysfunction;  elevated mean gradient across aortic valve likely related to vigorous LV  function as visually aortic valve opens well.  2. Left ventricular ejection fraction, by estimation, is >75%. The left  ventricle has hyperdynamic function. The left ventricle has no regional  wall motion abnormalities. Left ventricular diastolic parameters are  consistent with Grade I diastolic  dysfunction (impaired relaxation).  3. Right ventricular systolic function is normal. The right ventricular  size is normal.  4. The mitral valve is normal in structure. No evidence of mitral valve  regurgitation. No evidence of mitral stenosis.  5. The aortic valve is tricuspid. Aortic valve regurgitation is not  visualized. Mild to moderate aortic valve sclerosis/calcification is  present, without any evidence of aortic stenosis.  6. The inferior vena cava is normal in size with greater than 50%  respiratory variability, suggesting right atrial pressure of 3 mmHg.   Laboratory Data:  High Sensitivity Troponin:   Recent Labs  Lab 08/30/19 1818 08/30/19 2004  TROPONINIHS 14 15     Chemistry Recent Labs  Lab 08/29/19 0301 08/30/19 0300 09/02/19 0518  NA 140 142 143  K 3.9 3.8 3.8  CL 113* 112* 109  CO2 18*  20* 25    GLUCOSE 322* 258* 188*  BUN 40* 51* 42*  CREATININE 1.97* 1.87* 1.38*  CALCIUM 7.9* 8.5* 8.7*  GFRNONAA 23* 25* 36*  GFRAA 27* 29* 42*  ANIONGAP 9 10 9     Recent Labs  Lab 08/25/2019 1405  PROT 8.6*  ALBUMIN 4.7  AST 51*  ALT 42  ALKPHOS 60  BILITOT 0.9   Hematology Recent Labs  Lab 08/29/19 0301 08/30/19 0300 09/01/19 0534  WBC 11.9* 16.6* 12.5*  RBC 3.33* 3.32* 3.36*  HGB 10.1* 10.0* 10.1*  HCT 32.2* 31.6* 31.9*  MCV 96.7 95.2 94.9  MCH 30.3 30.1 30.1  MCHC 31.4 31.6 31.7  RDW 14.4 14.3 14.4  PLT 230 301 301   BNPNo results for input(s): BNP, PROBNP in the last 168 hours.  DDimer No results for input(s): DDIMER in the last 168 hours.   Radiology/Studies:  ECHOCARDIOGRAM COMPLETE  Result Date: 08/30/2019    ECHOCARDIOGRAM REPORT   Patient Name:   [facility] Date of Exam: 08/30/2019 Medical Rec #:  [id number]         Height:       60.0 in Accession #:    [id number]        Weight:       149.5 lb Date of Birth:  06-24-1940         BSA:          1.649 m Patient Age:    78 years          BP:           143/78 mmHg Patient Gender: F                 HR:           119 bpm. Exam Location:  Inpatient Procedure: 2D Echo, Cardiac Doppler and Color Doppler Indications:    I48.0 Paroxysmal atrial fibrillation  History:        Patient has no prior history of Echocardiogram examinations.                 Risk Factors:Hypertension and Diabetes. CKD. GERD.  Sonographer:    Elmarie Shiley Dance Referring Phys: [phone] RALPH A NETTEY IMPRESSIONS  1. Hyperdynamic LV systolic function; grade 1 diastolic dysfunction; elevated mean gradient across aortic valve likely related to vigorous LV function as visually aortic valve opens well.  2. Left ventricular ejection fraction, by estimation, is >75%. The left ventricle has hyperdynamic function. The left ventricle has no regional wall motion abnormalities. Left ventricular diastolic parameters are consistent with Grade I diastolic dysfunction (impaired  relaxation).  3. Right ventricular systolic function is normal. The right ventricular size is normal.  4. The mitral valve is normal in structure. No evidence of mitral valve regurgitation. No evidence of mitral stenosis.  5. The aortic valve is tricuspid. Aortic valve regurgitation is not visualized. Mild to moderate aortic valve sclerosis/calcification is present, without any evidence of aortic stenosis.  6. The inferior vena cava is normal in size with greater than 50% respiratory variability, suggesting right atrial pressure of 3 mmHg. FINDINGS  Left Ventricle: Left ventricular ejection fraction, by estimation, is >75%. The left ventricle has hyperdynamic function. The left ventricle has no regional wall motion abnormalities. The left ventricular internal cavity size was normal in size. There is no left ventricular hypertrophy. Left ventricular diastolic parameters are consistent with Grade I diastolic dysfunction (impaired relaxation). Right Ventricle: The right ventricular size is normal.Right ventricular systolic function is normal. Left  Atrium: Left atrial size was normal in size. Right Atrium: Right atrial size was normal in size. Pericardium: There is no evidence of pericardial effusion. Mitral Valve: The mitral valve is normal in structure. Normal mobility of the mitral valve leaflets. Mild mitral annular calcification. No evidence of mitral valve regurgitation. No evidence of mitral valve stenosis. Tricuspid Valve: The tricuspid valve is normal in structure. Tricuspid valve regurgitation is trivial. No evidence of tricuspid stenosis. Aortic Valve: The aortic valve is tricuspid. Aortic valve regurgitation is not visualized. Mild to moderate aortic valve sclerosis/calcification is present, without any evidence of aortic stenosis. Aortic valve mean gradient measures 12.0 mmHg. Aortic valve peak gradient measures 24.4 mmHg. Aortic valve area, by VTI measures 1.41 cm. Pulmonic Valve: The pulmonic valve was  not well visualized. Pulmonic valve regurgitation is not visualized. No evidence of pulmonic stenosis. Aorta: The aortic root is normal in size and structure. Venous: The inferior vena cava is normal in size with greater than 50% respiratory variability, suggesting right atrial pressure of 3 mmHg.  Additional Comments: Hyperdynamic LV systolic function; grade 1 diastolic dysfunction; elevated mean gradient across aortic valve likely related to vigorous LV function as visually aortic valve opens well.  LEFT VENTRICLE PLAX 2D LVIDd:         3.60 cm LVIDs:         2.60 cm LV PW:         1.10 cm LV IVS:        1.00 cm LVOT diam:     2.00 cm LV SV:         58 LV SV Index:   35 LVOT Area:     3.14 cm  RIGHT VENTRICLE             IVC RV Basal diam:  2.30 cm     IVC diam: 1.70 cm RV S prime:     16.60 cm/s TAPSE (M-mode): 1.7 cm LEFT ATRIUM             Index       RIGHT ATRIUM           Index LA diam:        3.30 cm 2.00 cm/m  RA Area:     12.80 cm LA Vol (A2C):   17.9 ml 10.85 ml/m RA Volume:   29.90 ml  18.13 ml/m LA Vol (A4C):   19.9 ml 12.07 ml/m LA Biplane Vol: 20.8 ml 12.61 ml/m  AORTIC VALVE AV Area (Vmax):    1.30 cm AV Area (Vmean):   1.36 cm AV Area (VTI):     1.41 cm AV Vmax:           247.00 cm/s AV Vmean:          155.500 cm/s AV VTI:            0.416 m AV Peak Grad:      24.4 mmHg AV Mean Grad:      12.0 mmHg LVOT Vmax:         101.95 cm/s LVOT Vmean:        67.350 cm/s LVOT VTI:          0.186 m LVOT/AV VTI ratio: 0.45  AORTA Ao Root diam: 3.60 cm Ao Asc diam:  3.40 cm MV A velocity: 128.00 cm/s                             SHUNTS  Systemic VTI:  0.19 m                             Systemic Diam: 2.00 cm Olga Millers MD Electronically signed by Olga Millers MD Signature Date/Time: 08/30/2019/3:04:54 PM    Final    {  Assessment and Plan:   Sepsis/bilateral foot cellulitis and myositis - Present on admission -Urine culture negative -COVID-19 and influenza  negative -Recent chest x-ray unremarkable -Left foot x-ray with possible evidence of infection>>improving -IV antibiotics per IM  A. fib RVR -This is a new diagnosis in the setting of SEPSIS/foot cellulitis  - On admission EKG showed sinus tach with PACs. Developed A. fib with RVR and was started on IV dilt.  EKG 08/30/2019 showed A. fib rate 169. - Patient converted to sinus 4/4 and IV dilt  was transitioned to home Coreg 25 mg BID -.EKG today - Patient remains in sinus rhythm - Echo this admission showed EF >75%, G1DD -CHA2DS2-VASc = 7 (age, female, hypertension, DM, stroke) This patients CHA2DS2-VASc Score and unadjusted Ischemic Stroke Rate (% per year) is equal to 11.2 % stroke rate/year from a score of 7  Above score calculated as 1 point each if present [CHF, HTN, DM, Vascular=MI/PAD/Aortic Plaque, Age if 65-74, or Female] Above score calculated as 2 points each if present [Age > 75, or Stroke/TIA/TE] -Patient has possible history of intracranial hemorrhage in 2016 although unable to locate documents of this.  - Will discuss a/c with MD. Would consider Eliquis. If creatinine is >1.5 would qualify for low dose, however today creatinine is 1.38. In Care everywhere baseline creatinine recorded around 2.2. - IF OK with hospitality would favor starting anticoagulation. Might need neurosurgery input  Hypertension -Antihypertensives held for anaphylactic shock>>now resolved -At home patient was on Lotrel 10-20 mg daily, Coreg 25 mg twice daily, clonidine 0.1 mg twice daily, BiDil 20-37.5mg  3 times daily>> re-started -Pressures stable  Hyperlipidemia -At home patient was on pravastatin 80 mg daily, fenofibrate 160 mg daily Zetia 10 mg daily -No recent labs on file.  Will check lipid panel  CKD stage IV - Baseline around 2.2  - creatinine on admission 2.01. Today 1.38  For questions or updates, please contact [facility] Please consult [email or web address] for contact info under      Signed, Lidia Clavijo David Stall, PA-C  09/02/2019 10:43 AM

## 2019-09-03 ENCOUNTER — Inpatient Hospital Stay (HOSPITAL_COMMUNITY): Payer: Medicare Other

## 2019-09-03 DIAGNOSIS — F0390 Unspecified dementia without behavioral disturbance: Secondary | ICD-10-CM | POA: Diagnosis not present

## 2019-09-03 DIAGNOSIS — I639 Cerebral infarction, unspecified: Secondary | ICD-10-CM | POA: Diagnosis not present

## 2019-09-03 DIAGNOSIS — I4891 Unspecified atrial fibrillation: Secondary | ICD-10-CM | POA: Diagnosis not present

## 2019-09-03 DIAGNOSIS — M79671 Pain in right foot: Secondary | ICD-10-CM | POA: Diagnosis not present

## 2019-09-03 DIAGNOSIS — I10 Essential (primary) hypertension: Secondary | ICD-10-CM | POA: Diagnosis not present

## 2019-09-03 DIAGNOSIS — A419 Sepsis, unspecified organism: Secondary | ICD-10-CM | POA: Diagnosis not present

## 2019-09-03 DIAGNOSIS — R52 Pain, unspecified: Secondary | ICD-10-CM | POA: Diagnosis not present

## 2019-09-03 DIAGNOSIS — N179 Acute kidney failure, unspecified: Secondary | ICD-10-CM | POA: Diagnosis not present

## 2019-09-03 DIAGNOSIS — R6510 Systemic inflammatory response syndrome (SIRS) of non-infectious origin without acute organ dysfunction: Secondary | ICD-10-CM | POA: Diagnosis not present

## 2019-09-03 LAB — RESPIRATORY PANEL BY RT PCR (FLU A&B, COVID)
Influenza A by PCR: NEGATIVE
Influenza B by PCR: NEGATIVE
SARS Coronavirus 2 by RT PCR: NEGATIVE

## 2019-09-03 LAB — GLUCOSE, CAPILLARY
Glucose-Capillary: 196 mg/dL — ABNORMAL HIGH (ref 70–99)
Glucose-Capillary: 192 mg/dL — ABNORMAL HIGH (ref 70–99)
Glucose-Capillary: 227 mg/dL — ABNORMAL HIGH (ref 70–99)
Glucose-Capillary: 205 mg/dL — ABNORMAL HIGH (ref 70–99)

## 2019-09-03 LAB — BASIC METABOLIC PANEL
Anion gap: 11 (ref 5–15)
BUN: 53 mg/dL — ABNORMAL HIGH (ref 8–23)
CO2: 24 mmol/L (ref 22–32)
Calcium: 8.8 mg/dL — ABNORMAL LOW (ref 8.9–10.3)
Chloride: 109 mmol/L (ref 98–111)
Creatinine, Ser: 1.66 mg/dL — ABNORMAL HIGH (ref 0.44–1.00)
GFR calc Af Amer: 33 mL/min — ABNORMAL LOW (ref >60)
GFR calc non Af Amer: 29 mL/min — ABNORMAL LOW (ref >60)
Glucose, Bld: 187 mg/dL — ABNORMAL HIGH (ref 70–99)
Potassium: 3.8 mmol/L (ref 3.5–5.1)
Sodium: 144 mmol/L (ref 135–145)

## 2019-09-03 LAB — CBC
HCT: 28.8 % — ABNORMAL LOW (ref 36.0–46.0)
Hemoglobin: 9 g/dL — ABNORMAL LOW (ref 12.0–15.0)
MCH: 30 pg (ref 26.0–34.0)
MCHC: 31.3 g/dL (ref 30.0–36.0)
MCV: 96 fL (ref 80.0–100.0)
Platelets: 322 10*3/uL (ref 150–400)
RBC: 3 MIL/uL — ABNORMAL LOW (ref 3.87–5.11)
RDW: 14.3 % (ref 11.5–15.5)
WBC: 12.6 10*3/uL — ABNORMAL HIGH (ref 4.0–10.5)
nRBC: 0 % (ref 0.0–0.2)

## 2019-09-03 LAB — LIPID PANEL
Cholesterol: 107 mg/dL (ref 0–200)
HDL: 35 mg/dL — ABNORMAL LOW (ref >40)
LDL Cholesterol: 43 mg/dL (ref 0–99)
Total CHOL/HDL Ratio: 3.1 RATIO
Triglycerides: 144 mg/dL (ref <150)
VLDL: 29 mg/dL (ref 0–40)

## 2019-09-03 NOTE — Consult Note (Signed)
Requesting Physician: Dr. Raiford Noble    Reason for Consult: Recommendation on anticoagulation  History obtained from: Patient and Chart    HPI:                                                                                                                                       Natalie Rodgers is a 79 y.o. female with past medical history significant for A. fib, chronic kidney disease, dementia, diabetes mellitus, hypertension, hyperlipidemia, prior hemorrhagic stroke with residual right hemiparesis presents to the emergency department with SIRS.  No active neurological issue.  Neurology was consulted for recommendations starting anticoagulation for atrial fibrillation with history of remote intracerebral hemorrhage.  Neurology on-call recommended MRI brain which was performed and showed a small punctate infarct in the left cerebellum. Patient not candidate for TPA as is incidental infarct and no symptoms.     Past Medical History:  Diagnosis Date  . A-fib ([place])   . Chronic kidney disease    CKD Stage IV  . Dementia ([place])   . Diabetes mellitus without complication ([place])   . GERD (gastroesophageal reflux disease)   . Hyperlipidemia   . Hypertension     Past Surgical History:  Procedure Laterality Date  . ABDOMINAL HYSTERECTOMY     salpinoophorectomy  . LAPAROTOMY      History reviewed. No pertinent family history. Social History:  reports that she has never smoked. She has never used smokeless tobacco. She reports that she does not drink alcohol or use drugs.  Allergies:  Allergies  Allergen Reactions  . Barium-Containing Compounds Anaphylaxis    Stridor, shock    Medications:                                                                                                                        I reviewed home medications   ROS:  14 systems  reviewed and negative except above    Examination:                                                                                                      General: Appears well-developed  Psych: Affect appropriate to situation Eyes: No scleral injection HENT: No OP obstrucion Head: Normocephalic.  Cardiovascular: Normal rate and regular rhythm.  Respiratory: Effort normal and breath sounds normal to anterior ascultation GI: Soft.  No distension. There is no tenderness.  Skin: WDI    Neurological Examination Mental Status: Alert and oriented to date of birth and name only.  Not oriented to place, time or age.  Mild aphasia. Cranial Nerves: II: Visual fields grossly normal,  III,IV, VI: ptosis not present, extra-ocular motions intact bilaterally, pupils equal, round, reactive to light and accommodation V,VII: smile symmetric, facial light touch sensation normal bilaterally VIII: hearing normal bilaterally IX,X: uvula rises symmetrically XI: bilateral shoulder shrug XII: midline tongue extension Motor: Right : Upper extremity   4/5    Left:     Upper extremity   5/5  Lower extremity   4/5     Lower extremity   5/5 Tone and bulk: Increased tone in the right upper extremity and lower extremity Sensory: Pinprick and light touch intact throughout, bilaterally Deep Tendon Reflexes: 3+ patellar reflex for the right leg, 2+ over the left Plantars: Right: downgoing   Left: downgoing Cerebellar: No gross ataxia noted      Lab Results: Basic Metabolic Panel: Recent Labs  Lab 08/28/19 0237 08/28/19 0237 08/29/19 0301 08/29/19 0301 08/30/19 0300 08/30/19 1818 09/02/19 0518 09/03/19 0555  NA 140  --  140  --  142  --  143 144  K 3.6  --  3.9  --  3.8  --  3.8 3.8  CL 114*  --  113*  --  112*  --  109 109  CO2 18*  --  18*  --  20*  --  25 24  GLUCOSE 160*  --  322*  --  258*  --  188* 187*  BUN 33*  --  40*  --  51*  --  42* 53*  CREATININE 1.83*  --  1.97*  --  1.87*  --   1.38* 1.66*  CALCIUM 7.4*   < > 7.9*   < > 8.5*  --  8.7* 8.8*  MG  --   --   --   --   --  2.0  --   --    < > = values in this interval not displayed.    CBC: Recent Labs  Lab 08/28/19 0237 08/29/19 0301 08/30/19 0300 09/01/19 0534 09/03/19 0555  WBC 11.3* 11.9* 16.6* 12.5* 12.6*  HGB 9.3* 10.1* 10.0* 10.1* 9.0*  HCT 30.4* 32.2* 31.6* 31.9* 28.8*  MCV 97.7 96.7 95.2 94.9 96.0  PLT 195 230 301 301 322    Coagulation Studies: No results for input(s): LABPROT, INR in the last 72 hours.  Imaging: MR BRAIN WO CONTRAST  Result Date: 09/03/2019 CLINICAL DATA:  Anticoagulation for atrial fibrillation, history of intracranial hemorrhage EXAM: MRI HEAD WITHOUT CONTRAST TECHNIQUE: Multiplanar, multiecho pulse sequences of the brain and surrounding structures were obtained without intravenous contrast. COMPARISON:  None. FINDINGS: Brain: There is a punctate focus of mildly reduced diffusion within the anterior inferior left cerebellum. Patchy and confluent areas of T2 hyperintensity in the supratentorial white matter are nonspecific may reflect moderate chronic microvascular ischemic changes. There are chronic small vessel infarcts of the basal ganglia and adjacent white matter bilaterally as well as the thalamus bilaterally. Possible chronic blood products associated with right thalamic infarct. There is a small chronic cortical infarct of the inferior left temporal lobe. Small focus of susceptibility the left temporal lobe also likely reflects chronic microhemorrhage. Prominence of the ventricles and sulci reflects generalized parenchymal volume loss. There is no intracranial mass, mass effect, or edema. There is no hydrocephalus or extra-axial fluid collection. Vascular: Major vessel flow voids at the skull base are preserved. Skull and upper cervical spine: Normal marrow signal is preserved. Sinuses/Orbits: Paranasal sinuses are aerated. Orbits are unremarkable. Other: Sella is unremarkable. Mild  patchy mastoid fluid opacification. 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 Moderate chronic microvascular ischemic changes. 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 No evidence of recent hemorrhage. Minor chronic blood products are noted. Electronically Signed   By: Macy Mis M.D.   On: 09/03/2019 07:17     ASSESSMENT AND PLAN   79 year old female with history of hypertension, hyperlipidemia, diabetes mellitus, CKD stage IV, dementia and CVA with intracranial hemorrhage in 2016.  Unfortunately unclear as to etiology of intracranial hemorrhage-whether this was related to severe hypertension or not.  MRI brain shows mild chronic blood products, most notably in the right temporal lobe.  Impression: -Acute/subacute left cerebellar infarct: Incidental finding -Atrial fibrillation, currently not on anticoagulation  Recommendations -Echocardiogram to look for thrombus, dilated left atrium which would increase risk for cardioembolic event -Carotid Dopplers -Consider Eliquis 2.5 mg twice daily due to renal dysfunction and age,  prior ICH is not an absolute contraindication. However, currently trying to obtain records from prior [facility] admission to understand etiology of ICH. -PT OT  Karena Addison Aroor [facility] Pager Number [phone]

## 2019-09-03 NOTE — Progress Notes (Signed)
  Speech Language Pathology Treatment: Dysphagia  Patient Details Name: Natalie Rodgers MRN: [id number] DOB: May 13, 1941 Today's Date: 09/03/2019 Time: 5001-6429 SLP Time Calculation (min) (ACUTE ONLY): 26 min  Assessment / Plan / Recommendation Clinical Impression  RN reports pt is not tolerating advancement in diet from dys3 to regular.  She advised to downgrade diet to dys3/thin.  With pt's hemiparesis on right and her facial asymmetry, dysarthria she is at increased aspiration risk.  No indications of airway compromise with po intake observed *graham crackers dipped in pudding, pudding, soda, juice and water.  However agree with need for dietary downgrade to decrease pt's aspiration risk especially with solids. No SLP follow up indicated at this time as all education completed and pt on most appropriate diet at this time.  Informed pt to recommendations who stated she wanted "celery" for dinner.    HPI HPI: Pt is 79 yo female with PMH including HTN, DM2, CKD, mild dementia, anemia, oncology care, hx of ICH with R hemiparesis 2016. Pt presented to ED with complaint of leg and foot pain.  Imaging of feet - negative for acute fx.  Pt admitted with SIRS (no clear source per note) and afib with RVR.    Swallow evaluation ordered due to pt overtly coughing with water today.  RN reports pt tolerated po medications well.  Pt was tachycardic and febrile upon admission.  Urinalysis was negative as well CXR.  Pt resides at [facility].  Per MD note, pt follows some directions and is disoriented to location.  Pt denies difficulty with swallowing.  She underwent a BSE and MBS, MBS was tolerated well without aspiration or penetration however pt later had an anaphalaxis episode and now adds barium containing compounds to her allergy list.  She has been on a dys3/thin diet and tolerating.  Follow up to assure tolerance indication.      SLP Plan  All goals met       Recommendations  Diet recommendations:  Dysphagia 3 (mechanical soft);Thin liquid Liquids provided via: Cup Medication Administration: Whole meds with puree Supervision: Patient able to self feed Compensations: Slow rate;Small sips/bites Postural Changes and/or Swallow Maneuvers: Seated upright 90 degrees;Upright 30-60 min after meal                Follow up Recommendations: None SLP Visit Diagnosis: Dysphagia, oral phase (R13.11) Plan: All goals met       GO                Natalie Rodgers 09/03/2019, 3:33 PM   Natalie Lime, MS [place] Office [phone]

## 2019-09-03 NOTE — NC FL2 (Signed)
[place] MEDICAID FL2 LEVEL OF CARE SCREENING TOOL     IDENTIFICATION  Patient Name: Natalie Rodgers Birthdate: 08/27/1940 Sex: female Admission Date (Current Location): 07/29/2019  [facility] and [place] Number:  Herbalist and Address:  [facility],  [address], [place]      Provider Number: [id number]  Attending Physician Name and Address:  Kerney Elbe, DO  Relative Name and Phone Number:       Current Level of Care: Hospital Recommended Level of Care: [place] Prior Approval Number:    Date Approved/Denied:   PASRR Number:   [id number]  Discharge Plan: SNF    Current Diagnoses: Patient Active Problem List   Diagnosis Date Noted  . Atrial fibrillation with RVR ([place]) 08/27/2019  . AKI (acute kidney injury) ([place]) 08/27/2019  . Type 2 diabetes mellitus with hyperlipidemia ([place]) 08/27/2019  . Essential hypertension 08/27/2019  . Dementia without behavioral disturbance ([place]) 08/27/2019  . Bilateral foot pain 08/27/2019  . SIRS (systemic inflammatory response syndrome) ([place]) 08/25/2019    Orientation RESPIRATION BLADDER Height & Weight     Self, Place  Normal External catheter Weight: 67.8 kg Height:  5' (152.4 cm)  BEHAVIORAL SYMPTOMS/MOOD NEUROLOGICAL BOWEL NUTRITION STATUS  (none) (none) Continent Diet(see d/c summary)  AMBULATORY STATUS COMMUNICATION OF NEEDS Skin   Extensive Assist Verbally Normal                       Personal Care Assistance Level of Assistance  Bathing, Feeding, Dressing Bathing Assistance: Maximum assistance Feeding assistance: Independent Dressing Assistance: Maximum assistance     Functional Limitations Info  Sight, Hearing, Speech Sight Info: Adequate Hearing Info: Adequate Speech Info: Adequate    SPECIAL CARE FACTORS FREQUENCY  PT (By licensed PT), OT (By licensed OT)     PT Frequency: 5X/W OT Frequency: 5X/W            Contractures Contractures  Info: Not present    Additional Factors Info  Code Status, Allergies Code Status Info: full Allergies Info: barium-containing compounds           Current Medications (09/03/2019):  This is the current hospital active medication list Current Facility-Administered Medications  Medication Dose Route Frequency Provider Last Rate Last Admin  . 0.9 %  sodium chloride infusion   Intravenous PRN Mariel Aloe, MD   Stopped at 08/30/19 0911  . acetaminophen (TYLENOL) tablet 650 mg  650 mg Oral Q6H PRN Terrilee Croak, MD   650 mg at 08/27/19 2329   Or  . acetaminophen (TYLENOL) suppository 650 mg  650 mg Rectal Q6H PRN Terrilee Croak, MD   650 mg at 08/27/19 0556  . albuterol (PROVENTIL) (2.5 MG/3ML) 0.083% nebulizer solution 2.5 mg  2.5 mg Nebulization Q6H PRN Noemi Chapel P, DO   2.5 mg at 08/29/19 1337  . aspirin EC tablet 81 mg  81 mg Oral Daily Dahal, Marlowe Aschoff, MD   81 mg at 09/03/19 1052  . benazepril (LOTENSIN) tablet 20 mg  20 mg Oral Daily Mariel Aloe, MD   20 mg at 09/03/19 1052  . carvedilol (COREG) tablet 25 mg  25 mg Oral BID WC Eugenie Filler, MD   25 mg at 09/03/19 1051  . cefdinir (OMNICEF) capsule 300 mg  300 mg Oral Daily Mariel Aloe, MD   300 mg at 09/03/19 1051  . Chlorhexidine Gluconate Cloth 2 % PADS 6 each  6 each Topical Daily  Terrilee Croak, MD   6 each at 09/03/19 1053  . diclofenac Sodium (VOLTAREN) 1 % topical gel 4 g  4 g Topical QID Mariel Aloe, MD   4 g at 09/03/19 1053  . diltiazem (CARDIZEM CD) 24 hr capsule 180 mg  180 mg Oral Daily Fransico Him R, MD   180 mg at 09/03/19 1052  . diphenhydrAMINE (BENADRYL) injection 50 mg  50 mg Intravenous Q6H PRN Mariel Aloe, MD      . donepezil (ARICEPT) tablet 10 mg  10 mg Oral QHS Terrilee Croak, MD   10 mg at 09/02/19 2129  . ezetimibe (ZETIA) tablet 10 mg  10 mg Oral Daily Dahal, Binaya, MD   10 mg at 09/03/19 1051  . fenofibrate tablet 160 mg  160 mg Oral Daily Dahal, Binaya, MD   160 mg at 09/03/19 1052  .  hydrALAZINE (APRESOLINE) tablet 25 mg  25 mg Oral Q6H PRN Nicole Kindred A, DO   25 mg at 08/30/19 0233  . insulin aspart (novoLOG) injection 0-5 Units  0-5 Units Subcutaneous QHS Terrilee Croak, MD   2 Units at 09/02/19 2030  . insulin aspart (novoLOG) injection 0-9 Units  0-9 Units Subcutaneous TID WC Terrilee Croak, MD   2 Units at 09/03/19 1217  . isosorbide-hydrALAZINE (BIDIL) 20-37.5 MG per tablet 1 tablet  1 tablet Oral TID Mariel Aloe, MD   1 tablet at 09/03/19 1052  . labetalol (NORMODYNE) injection 10 mg  10 mg Intravenous Q4H PRN Nicole Kindred A, DO   10 mg at 08/30/19 0343  . magnesium hydroxide (MILK OF MAGNESIA) suspension 30 mL  30 mL Oral Daily PRN Dahal, Marlowe Aschoff, MD      . melatonin tablet 2 mg  2 mg Oral QHS Mariel Aloe, MD   2 mg at 09/02/19 2129  . ondansetron (ZOFRAN) tablet 4 mg  4 mg Oral Q6H PRN Dahal, Binaya, MD       Or  . ondansetron (ZOFRAN) injection 4 mg  4 mg Intravenous Q6H PRN Dahal, Binaya, MD      . pravastatin (PRAVACHOL) tablet 80 mg  80 mg Oral QHS Terrilee Croak, MD   80 mg at 09/02/19 2129  . QUEtiapine (SEROQUEL) tablet 25 mg  25 mg Oral Daily Dahal, Binaya, MD   25 mg at 09/03/19 1051  . QUEtiapine (SEROQUEL) tablet 50 mg  50 mg Oral QHS Terrilee Croak, MD   50 mg at 09/02/19 2129  . Racepinephrine HCl 2.25 % nebulizer solution 0.5 mL  0.5 mL Nebulization Q2H PRN Julian Hy, DO      . senna (SENOKOT) tablet 8.6 mg  1 tablet Oral BID Dahal, Marlowe Aschoff, MD   8.6 mg at 09/03/19 1053  . sodium chloride flush (NS) 0.9 % injection 3 mL  3 mL Intravenous Q12H Dahal, Marlowe Aschoff, MD   3 mL at 09/02/19 2130  . sodium phosphate (FLEET) 7-19 GM/118ML enema 1 enema  1 enema Rectal Once PRN Dahal, Binaya, MD      . sorbitol 70 % solution 30 mL  30 mL Oral Daily PRN Dahal, Marlowe Aschoff, MD         Discharge Medications: Please see discharge summary for a list of discharge medications.  Relevant Imaging Results:  Relevant Lab Results:   Additional Information [place]  [place]  [place], [place]

## 2019-09-03 NOTE — Care Management Important Message (Signed)
Important Message  Patient Details IM Letter given to  Roque Lias SW Case Manager to present to the Patient Name: Natalie Rodgers MRN: [id number] Date of Birth: May 15, 1941   Medicare Important Message Given:  Yes     Kerin Salen 09/03/2019, 10:28 AM

## 2019-09-03 NOTE — Progress Notes (Signed)
PROGRESS NOTE    Natalie Rodgers  MHD:[id number] DOB: 20-Apr-1941 DOA: 08/06/2019 PCP: Welford Roche, NP (Confirm with patient/family/NH records and if not entered, this HAS to be entered at [facility] point of entry. "No PCP" if truly none.)   Brief Narrative:  Natalie Rodgers is a 79 y.o. femalewith PMH significant for HTN, DM2 not on insulin, CKD, dementia, chronic anemia. Patient presented secondary to right leg pain. She resides at [facility]. She was found to be febrile on admission with associated evidence concerning for SIRS with unknown source likely this was secondary to a right foot cellulitis and myositis. She was started on empiric antibiotics and has now been transitioned to p.o. antibiotics. She also developed atrial fibrillation with RVR and started on Diltiazem IV.  Cardiology was consulted for further evaluation recommendations and recommending anticoagulation however there is question of anticoagulant the patient given her history of hemorrhagic CVA.  Incidentally she was worked up with a brain MRI at the recommendations of neurology and she is found to have an incidental CVA so neurology was formally consulted and will still try and obtain records about the patient's history of her intracranial hemorrhage.  Anticipating discharging to SNF next 24 to 48 hours once the issue of anticoagulation is been resolved and carotid Dopplers are obtained.  We will need to find out the etiology of intracranial hemorrhage to safely anticoagulate this patient.  Assessment & Plan:   Principal Problem:   SIRS (systemic inflammatory response syndrome) (HCC) Active Problems:   Atrial fibrillation with RVR (HCC)   AKI (acute kidney injury) ([place])   Type 2 diabetes mellitus with hyperlipidemia (HCC)   Essential hypertension   Dementia without behavioral disturbance (HCC)   Bilateral foot pain   SIRS present on Admission -Source of bilateral foot cellulitis and myositis. Present on admission.    -Does not meet sepsis criteria. SIRS criteria met on admission.  -Urine culture with no growth (final); blood cultures with no growth to date.  -COVID-19 and influenza negative.  -Chest x-ray without acute abnormalities. Left foot x-ray with possible evidence of infection. -Empirically started on Vancomycin and Cefepime. Now on Cefdinir.  -Blood cultures no growth to date.  Paroxysmal Atrial Fibrillation with RVR -Initially started on Diltiazem drip which is now discontinued.  -Currently in sinus rhythm. On Coreg and aspirin as an outpatient. CHA2DS2-VASc Scoreis 7.  -Patient with a history of intracranial hemorrhage.  -Patient reentered RVR on 4/3 secondary to discontinuation of Coreg and is now back in sinus rhythm -Continue Coreg -Cardiology recommendations for management and anticoagulation recommendations in setting of history of hemorrhagic stroke -Consulted neurology for further recommendations given her anticoagulation and neurology feels that she should not be anticoagulated if she had a spontaneous hemorrhagic stroke or if she had a stroke secondary to amyloid -Need further evaluation and obtaining records.  Neurology consulted formally and they are recommending considering Eliquis 2.5 mg p.o. twice daily due to her renal dysfunction age and given that prior ICH is 9 absolute contraindication however we will need to obtain records to understand the etiology of her ICH  Anaphylactic Shock -Unknown etiology. Possibly related to barium vs food ingestion.  -Patient had stridor and hypotension requiring racemic epinephrine, solu-medrol, Pepcid, benadryl.  -Patient required norepinephrine for blood pressure support which is now discontinued. Barium added as allergy to patient's chart.  -Resolved.  Acute/subacute left cerebellar infarct  -Incidental finding on her MRI -MRI showed "Small, likely late acute infarct of the left cerebellum. Moderate chronic microvascular  ischemic  changes. 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 No evidence of recent hemorrhage. Minor chronic blood products are Noted." -As below -Lipid panel as below as well as hemoglobin A1c -PT OT recommending skilled nursing facility and SLP recommending dysphagia 3 diet with thin liquids -Neurology consulted and recommending obtaining carotid Dopplers which have been ordered -Likely the patient needs anticoagulation for her atrial fibrillation RVR and likely the stroke could have been secondary to embolic event from A. fib but will need prior records to determine the etiology of her prior intracranial hemorrhage  Foot Cellulitis -MRI significant for cellulitis with mild myositis of both feet. No external evidence noted. Improved but still with some pain. -Continue Tylenol prn -Cefdinir (renally dosed). Will treat for 10-14 days based on symptoms -Voltaren gel for pain -PT/OT recommendations: SNF  Leukocytosis -Secondary to infection initially and trended down. Now elevated in setting of steroid use.  -Doubt related to infection. Stable. -Continue to Monitor Carefully   Mild right-sided rales -Possible aspiration.  -Clear chest x-ray on admission.  -Repeat chest x-ray without evidence of infiltrate.  -Resolved.  CKD stage IV -Creatinine of 2.02 on admission.  -Baseline creatinine of 2.2 per chart review on linked chart and review of outside records. -Patient does not have acute kidney injury but stable CKD.  -Creatinine is below baseline at this time at 1.66 today.  Diabetes Mellitus, type 2 -Hemoglobin A1C of 7%. Patient is on glipizide as an out patient. -Continue SSI -BG's have been ranging from 191-348  Essential hypertension -Uncontrolled in setting of holding antihypertensives from shock. -Continue Coreg, BiDil and amlodipine, benazepril and chlorthalidone -Holding clonidine with continued control of blood pressure.  Recommend discontinuing clonidine on discharge if BP remains stable -Blood pressures have been on the lower side and last blood pressure was 110/56 -Continue to Monitor BP per Protocol   Hyperlipidemia -Lipid panel showed cholesterol/HDL ratio 3.1, cholesterol level 107, HDL 35, LDL of 143, triglycerides 144, VLDL 29 -Continue ezetimibe 10 mg daily, pravastatin nightly, fenofibrate 160 mg p.o. daily  Dementia -Continue Donepezil 10 mg p.o. nightly  Abdominal distension -Improved. Patient with multiple bowel movements.  Dysphagia -SLP consulted and barium swallow performed 4/1. Heart healthy/carb modified diet started 4/6 -SLP reevaluated today diet recommendations to dysphagia 3 diet with thin liquids  History of intracranial hemorrhage in the past with right-sided weakness -We will need to determine the etiology given that she has a small punctate infarct in the left cerebellum  DVT prophylaxis: SCDs, Code Status: FULL CODE  Family Communication: Discussed with Daughter over the Phone Disposition Plan: D/C to SNF when able to find out if she can be anticoagulated from a neurological perspective   Consultants:   Cardiology  Neurology   Procedures:  ECHOCARDIOGRAM 08/30/19 IMPRESSIONS    1. Hyperdynamic LV systolic function; grade 1 diastolic dysfunction;  elevated mean gradient across aortic valve likely related to vigorous LV  function as visually aortic valve opens well.  2. Left ventricular ejection fraction, by estimation, is >75%. The left  ventricle has hyperdynamic function. The left ventricle has no regional  wall motion abnormalities. Left ventricular diastolic parameters are  consistent with Grade I diastolic  dysfunction (impaired relaxation).  3. Right ventricular systolic function is normal. The right ventricular  size is normal.  4. The mitral valve is normal in structure. No evidence of mitral valve  regurgitation. No evidence of mitral stenosis.   5. The aortic valve is tricuspid.  Aortic valve regurgitation is not  visualized. Mild to moderate aortic valve sclerosis/calcification is  present, without any evidence of aortic stenosis.  6. The inferior vena cava is normal in size with greater than 50%  respiratory variability, suggesting right atrial pressure of 3 mmHg.   FINDINGS  Left Ventricle: Left ventricular ejection fraction, by estimation, is  >75%. The left ventricle has hyperdynamic function. The left ventricle has  no regional wall motion abnormalities. The left ventricular internal  cavity size was normal in size. There  is no left ventricular hypertrophy. Left ventricular diastolic parameters  are consistent with Grade I diastolic dysfunction (impaired relaxation).   Right Ventricle: The right ventricular size is normal.Right ventricular  systolic function is normal.   Left Atrium: Left atrial size was normal in size.   Right Atrium: Right atrial size was normal in size.   Pericardium: There is no evidence of pericardial effusion.   Mitral Valve: The mitral valve is normal in structure. Normal mobility of  the mitral valve leaflets. Mild mitral annular calcification. No evidence  of mitral valve regurgitation. No evidence of mitral valve stenosis.   Tricuspid Valve: The tricuspid valve is normal in structure. Tricuspid  valve regurgitation is trivial. No evidence of tricuspid stenosis.   Aortic Valve: The aortic valve is tricuspid. Aortic valve regurgitation is  not visualized. Mild to moderate aortic valve sclerosis/calcification is  present, without any evidence of aortic stenosis. Aortic valve mean  gradient measures 12.0 mmHg. Aortic  valve peak gradient measures 24.4 mmHg. Aortic valve area, by VTI measures  1.41 cm.   Pulmonic Valve: The pulmonic valve was not well visualized. Pulmonic valve  regurgitation is not visualized. No evidence of pulmonic stenosis.   Aorta: The aortic root is normal in  size and structure.   Venous: The inferior vena cava is normal in size with greater than 50%  respiratory variability, suggesting right atrial pressure of 3 mmHg.    Additional Comments: Hyperdynamic LV systolic function; grade 1 diastolic  dysfunction; elevated mean gradient across aortic valve likely related to  vigorous LV function as visually aortic valve opens well.     LEFT VENTRICLE  PLAX 2D  LVIDd:     3.60 cm  LVIDs:     2.60 cm  LV PW:     1.10 cm  LV IVS:    1.00 cm  LVOT diam:   2.00 cm  LV SV:     58  LV SV Index:  35  LVOT Area:   3.14 cm     RIGHT VENTRICLE       IVC  RV Basal diam: 2.30 cm   IVC diam: 1.70 cm  RV S prime:   16.60 cm/s  TAPSE (M-mode): 1.7 cm   LEFT ATRIUM       Index    RIGHT ATRIUM      Index  LA diam:    3.30 cm 2.00 cm/m RA Area:   12.80 cm  LA Vol (A2C):  17.9 ml 10.85 ml/m RA Volume:  29.90 ml 18.13 ml/m  LA Vol (A4C):  19.9 ml 12.07 ml/m  LA Biplane Vol: 20.8 ml 12.61 ml/m  AORTIC VALVE  AV Area (Vmax):  1.30 cm  AV Area (Vmean):  1.36 cm  AV Area (VTI):   1.41 cm  AV Vmax:      247.00 cm/s  AV Vmean:     155.500 cm/s  AV VTI:      0.416 m  AV  Peak Grad:   24.4 mmHg  AV Mean Grad:   12.0 mmHg  LVOT Vmax:     101.95 cm/s  LVOT Vmean:    67.350 cm/s  LVOT VTI:     0.186 m  LVOT/AV VTI ratio: 0.45    AORTA  Ao Root diam: 3.60 cm  Ao Asc diam: 3.40 cm   MV A velocity: 128.00 cm/s               SHUNTS               Systemic VTI: 0.19 m               Systemic Diam: 2.00 cm   Antimicrobials:  Anti-infectives (From admission, onward)   Start     Dose/Rate Route Frequency Ordered Stop   09/01/19 1600  cefdinir (OMNICEF) capsule 300 mg     300 mg Oral Daily 09/01/19 1352     08/27/19 1500  ceFEPIme (MAXIPIME) 2 g in sodium chloride 0.9 % 100 mL IVPB  Status:   Discontinued     2 g 200 mL/hr over 30 Minutes Intravenous Every 24 hours 08/16/2019 1706 09/01/19 1352   08/06/2019 1345  vancomycin (VANCOCIN) IVPB 1000 mg/200 mL premix     1,000 mg 200 mL/hr over 60 Minutes Intravenous  Once 08/11/2019 1339 07/29/2019 1603   08/17/2019 1345  ceFEPIme (MAXIPIME) 2 g in sodium chloride 0.9 % 100 mL IVPB     2 g 200 mL/hr over 30 Minutes Intravenous  Once 07/30/2019 1339 08/15/2019 1528     Subjective: Examined at bedside and she does speak some English and understand but she remains somewhat confused.  No nausea or vomiting.  Feels okay and denies any pain.  No other concerns or complaints at this time.  Objective: Vitals:   09/02/19 1407 09/02/19 2000 09/03/19 0437 09/03/19 1330  BP: 120/60 118/69 139/62 (!) 110/56  Pulse: 77 80 99 75  Resp: _0 Temp: 98 F (36.7 C) 98.2 F (36.8 C) (!) 97.5 F (36.4 C) 97.7 F (36.5 C)  TempSrc:  Oral  Oral  SpO2: 96% 97% 96% 97%  Weight:      Height:        Intake/Output Summary (Last 24 hours) at 09/03/2019 1939 Last data filed at 09/03/2019 1700 Gross per 24 hour  Intake 720 ml  Output --  Net 720 ml   Filed Weights   08/12/2019 1537 08/27/19 0135  Weight: 83.9 kg 67.8 kg   Examination: Physical Exam:  Constitutional: WN/WD overweight female currently in NAD and appears calm and comfortable appears pleasantly confused Eyes: Lids and conjunctivae normal, sclerae anicteric  ENMT: External Ears, Nose appear normal. Grossly normal hearing.  Neck: Appears normal, supple, no cervical masses, normal ROM, no appreciable thyromegaly; no JVD Respiratory: Diminished to auscultation bilaterally, no wheezing, rales, rhonchi or crackles. Normal respiratory effort and patient is not tachypenic. No accessory muscle use.  Unlabored breathing Cardiovascular: RRR, no murmurs / rubs / gallops. S1 and S2 auscultated.  Trace extremity edema.  Abdomen: Soft, non-tender, slightly distended second body habitus. Bowel sounds  positive.  GU: Deferred. Musculoskeletal: No clubbing / cyanosis of digits/nails. No joint deformity upper and lower extremities.  Skin: No rashes, lesions, ulcers on limited skin evaluation on her foot is improved and has no signs of erythema. No induration; Warm and dry.  Neurologic: CN 2-12 grossly intact with no focal deficits.  Romberg sign cerebellar reflexes not assessed.  Psychiatric: Impaired judgment and insight. Alert and awake but not oriented.  Pleasantly confused but has a normal mood and appropriate affect.   Data Reviewed: I have personally reviewed following labs and imaging studies  CBC: Recent Labs  Lab 08/28/19 0237 08/29/19 0301 08/30/19 0300 09/01/19 0534 09/03/19 0555  WBC 11.3* 11.9* 16.6* 12.5* 12.6*  HGB 9.3* 10.1* 10.0* 10.1* 9.0*  HCT 30.4* 32.2* 31.6* 31.9* 28.8*  MCV 97.7 96.7 95.2 94.9 96.0  PLT 195 230 301 301 263   Basic Metabolic Panel: Recent Labs  Lab 08/28/19 0237 08/29/19 0301 08/30/19 0300 08/30/19 1818 09/02/19 0518 09/03/19 0555  NA 140 140 142  --  143 144  K 3.6 3.9 3.8  --  3.8 3.8  CL 114* 113* 112*  --  109 109  CO2 18* 18* 20*  --  25 24  GLUCOSE 160* 322* 258*  --  188* 187*  BUN 33* 40* 51*  --  42* 53*  CREATININE 1.83* 1.97* 1.87*  --  1.38* 1.66*  CALCIUM 7.4* 7.9* 8.5*  --  8.7* 8.8*  MG  --   --   --  2.0  --   --    GFR: Estimated Creatinine Clearance: 23.2 mL/min (A) (by C-G formula based on SCr of 1.66 mg/dL (H)). Liver Function Tests: No results for input(s): AST, ALT, ALKPHOS, BILITOT, PROT, ALBUMIN in the last 168 hours. No results for input(s): LIPASE, AMYLASE in the last 168 hours. No results for input(s): AMMONIA in the last 168 hours. Coagulation Profile: No results for input(s): INR, PROTIME in the last 168 hours. Cardiac Enzymes: No results for input(s): CKTOTAL, CKMB, CKMBINDEX, TROPONINI in the last 168 hours. BNP (last 3 results) No results for input(s): PROBNP in the last 8760 hours. HbA1C: No  results for input(s): HGBA1C in the last 72 hours. CBG: Recent Labs  Lab 09/02/19 1642 09/02/19 2001 09/03/19 0740 09/03/19 1143 09/03/19 1720  GLUCAP 197* 224* 196* 192* 227*   Lipid Profile: Recent Labs    09/03/19 0555  CHOL 107  HDL 35*  LDLCALC 43  TRIG 144  CHOLHDL 3.1   Thyroid Function Tests: No results for input(s): TSH, T4TOTAL, FREET4, T3FREE, THYROIDAB in the last 72 hours. Anemia Panel: No results for input(s): VITAMINB12, FOLATE, FERRITIN, TIBC, IRON, RETICCTPCT in the last 72 hours. Sepsis Labs: No results for input(s): PROCALCITON, LATICACIDVEN in the last 168 hours.  Recent Results (from the past 240 hour(s))  Blood culture (routine x 2)     Status: None   Collection Time: 08/09/2019  2:05 PM   Specimen: BLOOD  Result Value Ref Range Status   Specimen Description   Final    BLOOD LEFT ANTECUBITAL Performed at [facility] [address] [place], [place] [address]    Special Requests   Final    BOTTLES DRAWN AEROBIC AND ANAEROBIC Blood Culture adequate volume Performed at [facility] [address], [place], [place] [address]    Culture   Final    NO GROWTH 5 DAYS Performed at [facility], [place] [address], [place], [place] [address]    Report Status 08/31/2019 FINAL  Final  Respiratory Panel by RT PCR (Flu A&B, Covid) - Nasopharyngeal Swab     Status: None   Collection Time: 08/04/2019  2:18 PM   Specimen: Nasopharyngeal Swab  Result Value Ref Range Status   SARS Coronavirus 2 by RT PCR NEGATIVE NEGATIVE Final    Comment: (NOTE)  SARS-CoV-2 target nucleic acids are NOT DETECTED. The SARS-CoV-2 RNA is generally detectable in upper respiratoy specimens during the acute phase of infection. The lowest concentration of SARS-CoV-2 viral copies this assay can detect is 131 copies/mL. A negative result does not preclude SARS-Cov-2 infection and should not be used as the sole basis for treatment or other  patient management decisions. A negative result may occur with  improper specimen collection/handling, submission of specimen other than nasopharyngeal swab, presence of viral mutation(s) within the areas targeted by this assay, and inadequate number of viral copies (<131 copies/mL). A negative result must be combined with clinical observations, patient history, and epidemiological information. The expected result is Negative. Fact Sheet for Patients:  PinkCheek.be Fact Sheet for Healthcare Providers:  GravelBags.it This test is not yet ap proved or cleared by the [place] FDA and  has been authorized for detection and/or diagnosis of SARS-CoV-2 by FDA under an Emergency Use Authorization (EUA). This EUA will remain  in effect (meaning this test can be used) for the duration of the COVID-19 declaration under Section 564(b)(1) of the Act, 21 U.S.C. section 360bbb-3(b)(1), unless the authorization is terminated or revoked sooner.    Influenza A by PCR NEGATIVE NEGATIVE Final   Influenza B by PCR NEGATIVE NEGATIVE Final    Comment: (NOTE) The Xpert Xpress SARS-CoV-2/FLU/RSV assay is intended as an aid in  the diagnosis of influenza from Nasopharyngeal swab specimens and  should not be used as a sole basis for treatment. Nasal washings and  aspirates are unacceptable for Xpert Xpress SARS-CoV-2/FLU/RSV  testing. Fact Sheet for Patients: PinkCheek.be Fact Sheet for Healthcare Providers: GravelBags.it This test is not yet approved or cleared by the [place] FDA and  has been authorized for detection and/or diagnosis of SARS-CoV-2 by  FDA under an Emergency Use Authorization (EUA). This EUA will remain  in effect (meaning this test can be used) for the duration of the  Covid-19 declaration under Section 564(b)(1) of the Act, 21  U.S.C. section 360bbb-3(b)(1), unless  the authorization is  terminated or revoked. Performed at [facility], [place] [address], [place], [place] [address]   Blood culture (routine x 2)     Status: None   Collection Time: 08/24/2019  2:37 PM   Specimen: BLOOD  Result Value Ref Range Status   Specimen Description   Final    BLOOD RIGHT WRIST Performed at [facility] [address] [place], [place] [address]    Special Requests   Final    BOTTLES DRAWN AEROBIC ONLY Blood Culture results may not be optimal due to an inadequate volume of blood received in culture bottles Performed at [facility] [address], [place], [place] [address]    Culture   Final    NO GROWTH 5 DAYS Performed at [facility], [place] [address] [place], [place] [address]    Report Status 08/31/2019 FINAL  Final  Urine culture     Status: None   Collection Time: 08/19/2019  3:19 PM   Specimen: Urine, Catheterized  Result Value Ref Range Status   Specimen Description   Final    URINE, CATHETERIZED Performed at [facility] [address], [place], [place] [address]    Special Requests   Final    NONE Performed at [facility], [place] [address] [place], [place] [address]    Culture   Final    NO GROWTH Performed at [facility], [place] [address] [place], [place] [address]    Report Status 08/27/2019 FINAL  Final  MRSA PCR Screening     Status: None   Collection Time: 08/27/19  1:46 AM   Specimen: Nasal Mucosa; Nasopharyngeal  Result Value Ref Range Status   MRSA by PCR NEGATIVE NEGATIVE Final    Comment:        The GeneXpert MRSA Assay (FDA approved for NASAL specimens only), is one component of a comprehensive MRSA colonization surveillance program. It is not intended to diagnose MRSA infection nor to guide or monitor treatment for MRSA infections. Performed at [facility], [place] [address], [place], [place]  [address]   Respiratory Panel by RT PCR (Flu A&B, Covid) - Nasopharyngeal Swab     Status: None   Collection Time: 09/03/19 12:28 PM   Specimen: Nasopharyngeal Swab  Result Value Ref Range Status   SARS Coronavirus 2 by RT PCR NEGATIVE NEGATIVE Final    Comment: (NOTE) SARS-CoV-2 target nucleic acids are NOT DETECTED. The SARS-CoV-2 RNA is generally detectable in upper respiratoy specimens during the acute phase of infection. The lowest concentration of SARS-CoV-2 viral copies this assay can detect is 131 copies/mL. A negative result does not preclude SARS-Cov-2 infection and should not be used as the sole basis for treatment or other patient management decisions. A negative result may occur with  improper specimen collection/handling, submission of specimen other than nasopharyngeal swab, presence of viral mutation(s) within the areas targeted by this assay, and inadequate number of viral copies (<131 copies/mL). A negative result must be combined with clinical observations, patient history, and epidemiological information. The expected result is Negative. Fact Sheet for Patients:  PinkCheek.be Fact Sheet for Healthcare Providers:  GravelBags.it This test is not yet ap proved or cleared by the [place] FDA and  has been authorized for detection and/or diagnosis of SARS-CoV-2 by FDA under an Emergency Use Authorization (EUA). This EUA will remain  in effect (meaning this test can be used) for the duration of the COVID-19 declaration under Section 564(b)(1) of the Act, 21 U.S.C. section 360bbb-3(b)(1), unless the authorization is terminated or revoked sooner.    Influenza A by PCR NEGATIVE NEGATIVE Final   Influenza B by PCR NEGATIVE NEGATIVE Final    Comment: (NOTE) The Xpert Xpress SARS-CoV-2/FLU/RSV assay is intended as an aid in  the diagnosis of influenza from Nasopharyngeal swab specimens and  should not be used as a  sole basis for treatment. Nasal washings and  aspirates are unacceptable for Xpert Xpress SARS-CoV-2/FLU/RSV  testing. Fact Sheet for Patients: PinkCheek.be Fact Sheet for Healthcare Providers: GravelBags.it This test is not yet approved or cleared by the [place] FDA and  has been authorized for detection and/or diagnosis of SARS-CoV-2 by  FDA under an Emergency Use Authorization (EUA). This EUA will remain  in effect (meaning this test can be used) for the duration of the  Covid-19 declaration under Section 564(b)(1) of the Act, 21  U.S.C. section 360bbb-3(b)(1), unless the authorization is  terminated or revoked. Performed at [facility], [place] [address], [place], [place] [address]     RN Pressure Injury Documentation:     Estimated body mass index is 29.19 kg/m as calculated from the following:   Height as of this encounter: 5' (1.524 m).   Weight as of this encounter: 67.8 kg.  Malnutrition Type:      Malnutrition Characteristics:      Nutrition Interventions:  Radiology Studies: MR BRAIN WO CONTRAST  Result Date: 09/03/2019 CLINICAL DATA:  Anticoagulation for atrial fibrillation, history of intracranial hemorrhage EXAM: MRI HEAD WITHOUT CONTRAST TECHNIQUE: Multiplanar, multiecho pulse sequences of the brain and surrounding structures were obtained without intravenous contrast. COMPARISON:  None. FINDINGS: Brain: There is a punctate focus of mildly reduced diffusion within the anterior inferior left cerebellum. Patchy and confluent areas of T2 hyperintensity in the supratentorial white matter are nonspecific may reflect moderate chronic microvascular ischemic changes. There are chronic small vessel infarcts of the basal ganglia and adjacent white matter bilaterally as well as the thalamus bilaterally. Possible chronic blood products associated with right thalamic infarct. There is a small  chronic cortical infarct of the inferior left temporal lobe. Small focus of susceptibility the left temporal lobe also likely reflects chronic microhemorrhage. Prominence of the ventricles and sulci reflects generalized parenchymal volume loss. There is no intracranial mass, mass effect, or edema. There is no hydrocephalus or extra-axial fluid collection. Vascular: Major vessel flow voids at the skull base are preserved. Skull and upper cervical spine: Normal marrow signal is preserved. Sinuses/Orbits: Paranasal sinuses are aerated. Orbits are unremarkable. Other: Sella is unremarkable. Mild patchy mastoid fluid opacification. 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 Moderate chronic microvascular ischemic changes. 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 No evidence of recent hemorrhage. Minor chronic blood products are noted. Electronically Signed   By: Macy Mis M.D.   On: 09/03/2019 07:17   Scheduled Meds:  aspirin EC  81 mg Oral Daily   benazepril  20 mg Oral Daily   carvedilol  25 mg Oral BID WC   cefdinir  300 mg Oral Daily   Chlorhexidine Gluconate Cloth  6 each Topical Daily   diclofenac Sodium  4 g Topical QID   diltiazem  180 mg Oral Daily   donepezil  10 mg Oral QHS   ezetimibe  10 mg Oral Daily   fenofibrate  160 mg Oral Daily   insulin aspart  0-5 Units Subcutaneous QHS   insulin aspart  0-9 Units Subcutaneous TID WC   isosorbide-hydrALAZINE  1 tablet Oral TID   melatonin  2 mg Oral QHS   pravastatin  80 mg Oral QHS   QUEtiapine  25 mg Oral Daily   QUEtiapine  50 mg Oral QHS   senna  1 tablet Oral BID   sodium chloride flush  3 mL Intravenous Q12H   Continuous Infusions:  sodium chloride Stopped (08/30/19 0911)    LOS: 8 days   Kerney Elbe, DO [facility] PAGER is on AMION  If 7PM-7AM, please contact night-coverage [email or web address]

## 2019-09-03 NOTE — Progress Notes (Signed)
Patient returned from MRI. No c/o. Will continue to monitor.

## 2019-09-03 NOTE — Progress Notes (Signed)
Progress Note  Patient Name: Natalie Rodgers Date of Encounter: 09/03/2019  Primary Cardiologist: Armanda Magic, MD   Subjective   Patient remains in sinus. Plan for MRI brain today. Patient denies chest pain.  Inpatient Medications    Scheduled Meds: . aspirin EC  81 mg Oral Daily  . benazepril  20 mg Oral Daily  . carvedilol  25 mg Oral BID WC  . cefdinir  300 mg Oral Daily  . Chlorhexidine Gluconate Cloth  6 each Topical Daily  . chlorthalidone  12.5 mg Oral Daily  . diclofenac Sodium  4 g Topical QID  . diltiazem  180 mg Oral Daily  . donepezil  10 mg Oral QHS  . ezetimibe  10 mg Oral Daily  . fenofibrate  160 mg Oral Daily  . insulin aspart  0-5 Units Subcutaneous QHS  . insulin aspart  0-9 Units Subcutaneous TID WC  . isosorbide-hydrALAZINE  1 tablet Oral TID  . melatonin  2 mg Oral QHS  . pravastatin  80 mg Oral QHS  . QUEtiapine  25 mg Oral Daily  . QUEtiapine  50 mg Oral QHS  . senna  1 tablet Oral BID  . sodium chloride flush  3 mL Intravenous Q12H   Continuous Infusions: . sodium chloride Stopped (08/30/19 0911)   PRN Meds: sodium chloride, acetaminophen **OR** acetaminophen, albuterol, diphenhydrAMINE, hydrALAZINE, labetalol, magnesium hydroxide, ondansetron **OR** ondansetron (ZOFRAN) IV, Racepinephrine HCl, sodium phosphate, sorbitol   Vital Signs    Vitals:   09/02/19 0407 09/02/19 1407 09/02/19 2000 09/03/19 0437  BP: 122/69 120/60 118/69 139/62  Pulse: 78 77 80 99  Resp: 16 20 17 16   Temp: 97.6 F (36.4 C) 98 F (36.7 C) 98.2 F (36.8 C) (!) 97.5 F (36.4 C)  TempSrc:   Oral   SpO2: 94% 96% 97% 96%  Weight:      Height:        Intake/Output Summary (Last 24 hours) at 09/03/2019 0758 Last data filed at 09/02/2019 1030 Gross per 24 hour  Intake 120 ml  Output --  Net 120 ml   Last 3 Weights 08/27/2019 08/24/2019  Weight (lbs) 149 lb 7.6 oz 185 lb  Weight (kg) 67.8 kg 83.915 kg      Telemetry    NSr, HR 70-80s, no other arrhythmias  noted - Personally Reviewed  ECG    No new - Personally Reviewed  Physical Exam   GEN: No acute distress.   Neck: No JVD Cardiac: RRR, no murmurs, rubs, or gallops.  Respiratory: Clear to auscultation bilaterally. GI: Soft, nontender, non-distended  MS: No edema; No deformity. Neuro:  Nonfocal  Psych: Normal affect   Labs    High Sensitivity Troponin:   Recent Labs  Lab 08/30/19 1818 08/30/19 2004  TROPONINIHS 14 15      Chemistry Recent Labs  Lab 08/30/19 0300 09/02/19 0518 09/03/19 0555  NA 142 143 144  K 3.8 3.8 3.8  CL 112* 109 109  CO2 20* 25 24  GLUCOSE 258* 188* 187*  BUN 51* 42* 53*  CREATININE 1.87* 1.38* 1.66*  CALCIUM 8.5* 8.7* 8.8*  GFRNONAA 25* 36* 29*  GFRAA 29* 42* 33*  ANIONGAP 10 9 11      Hematology Recent Labs  Lab 08/30/19 0300 09/01/19 0534 09/03/19 0555  WBC 16.6* 12.5* 12.6*  RBC 3.32* 3.36* 3.00*  HGB 10.0* 10.1* 9.0*  HCT 31.6* 31.9* 28.8*  MCV 95.2 94.9 96.0  MCH 30.1 30.1 30.0  MCHC 31.6 31.7 31.3  RDW 14.3 14.4 14.3  PLT 301 301 322    BNPNo results for input(s): BNP, PROBNP in the last 168 hours.   DDimer No results for input(s): DDIMER in the last 168 hours.   Radiology    MR BRAIN WO CONTRAST  Result Date: 09/03/2019 CLINICAL DATA:  Anticoagulation for atrial fibrillation, history of intracranial hemorrhage EXAM: MRI HEAD WITHOUT CONTRAST TECHNIQUE: Multiplanar, multiecho pulse sequences of the brain and surrounding structures were obtained without intravenous contrast. COMPARISON:  None. FINDINGS: Brain: There is a punctate focus of mildly reduced diffusion within the anterior inferior left cerebellum. Patchy and confluent areas of T2 hyperintensity in the supratentorial white matter are nonspecific may reflect moderate chronic microvascular ischemic changes. There are chronic small vessel infarcts of the basal ganglia and adjacent white matter bilaterally as well as the thalamus bilaterally. Possible chronic blood  products associated with right thalamic infarct. There is a small chronic cortical infarct of the inferior left temporal lobe. Small focus of susceptibility the left temporal lobe also likely reflects chronic microhemorrhage. Prominence of the ventricles and sulci reflects generalized parenchymal volume loss. There is no intracranial mass, mass effect, or edema. There is no hydrocephalus or extra-axial fluid collection. Vascular: Major vessel flow voids at the skull base are preserved. Skull and upper cervical spine: Normal marrow signal is preserved. Sinuses/Orbits: Paranasal sinuses are aerated. Orbits are unremarkable. Other: Sella is unremarkable. Mild patchy mastoid fluid opacification. 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 Moderate chronic microvascular ischemic changes. 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 No evidence of recent hemorrhage. Minor chronic blood products are noted. Electronically Signed   By: Guadlupe Spanish M.D.   On: 09/03/2019 07:17    Cardiac Studies   Echo 08/30/19 1. Hyperdynamic LV systolic function; grade 1 diastolic dysfunction;  elevated mean gradient across aortic valve likely related to vigorous LV  function as visually aortic valve opens well.  2. Left ventricular ejection fraction, by estimation, is >75%. The left  ventricle has hyperdynamic function. The left ventricle has no regional  wall motion abnormalities. Left ventricular diastolic parameters are  consistent with Grade I diastolic  dysfunction (impaired relaxation).  3. Right ventricular systolic function is normal. The right ventricular  size is normal.  4. The mitral valve is normal in structure. No evidence of mitral valve  regurgitation. No evidence of mitral stenosis.  5. The aortic valve is tricuspid. Aortic valve regurgitation is not  visualized. Mild to moderate aortic valve sclerosis/calcification is  present,  without any evidence of aortic stenosis.  6. The inferior vena cava is normal in size with greater than 50%  respiratory variability, suggesting right atrial pressure of 3 mmHg.   Patient Profile     79 y.o. 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   Assessment & Plan    Sepsis/bilateral foot cellulitis and myositis - Present on admission -Urine culture negative -COVID-19 and influenza negative -Recent chest x-ray unremarkable -Left foot x-ray with possible evidence of infection>>improving -IV antibiotics per IM  A. fib RVR -This is a new diagnosis in the setting of SEPSIS/foot cellulitis  -  Developed A. fib with RVR and was started on IV dilt.  EKG 08/30/2019 showed A. fib rate 169. Patient converted to sinus  4/4 and IV dilt  was transitioned to home Coreg 25 mg BID - Patient remains in sinus rhythm - Echo this admission showed EF >75%, G1DD -CHA2DS2-VASc = 7 (age, female, hypertension, DM, stroke) This patients CHA2DS2-VASc Score and unadjusted Ischemic Stroke Rate (% per year) is equal to 11.2 % stroke rate/year from a score of 7  Above score calculated as 1 point each if present [CHF, HTN, DM, Vascular=MI/PAD/Aortic Plaque, Age if 65-74, or Female] Above score calculated as 2 points each if present [Age > 75, or Stroke/TIA/TE] -Patient has possible history of intracranial hemorrhage in 2016 although unable to locate these documents.  - Will discuss a/c with MD. Would consider Eliquis for anticoagulation.  - Neurosurgery consulted and recommend MRI brain w/o contrast before starting Eliquis  Hypertension -Antihypertensives held for anaphylactic shock>>now resolved -At home patient was on Lotrel 10-20 mg daily, Coreg 25 mg twice daily, clonidine 0.1 mg twice  daily, BiDil 20-37.5mg  3 times daily>>  - Clonidine and amlodipine discontinued - Cardizem 180 mg added -Pressures stable  Hyperlipidemia -At home patient on pravastatin 80 mg daily, fenofibrate 160 mg daily Zetia 10 mg daily -LDL 43, HDL 35, TG 144  CKD stage IV - Baseline around 2.2   For questions or updates, please contact [facility] Please consult [email or web address] for contact info under        Signed, Johnella Crumm David Stall, PA-C  09/03/2019, 7:58 AM

## 2019-09-04 ENCOUNTER — Inpatient Hospital Stay (HOSPITAL_COMMUNITY): Payer: Medicare Other

## 2019-09-04 ENCOUNTER — Inpatient Hospital Stay (HOSPITAL_COMMUNITY): Payer: Medicare Other | Admitting: Anesthesiology

## 2019-09-04 ENCOUNTER — Encounter (HOSPITAL_COMMUNITY): Payer: Self-pay | Admitting: Internal Medicine

## 2019-09-04 ENCOUNTER — Encounter (HOSPITAL_COMMUNITY): Admission: EM | Disposition: E | Payer: Self-pay | Source: Skilled Nursing Facility | Attending: Family Medicine

## 2019-09-04 DIAGNOSIS — M79671 Pain in right foot: Secondary | ICD-10-CM | POA: Diagnosis not present

## 2019-09-04 DIAGNOSIS — A419 Sepsis, unspecified organism: Secondary | ICD-10-CM | POA: Diagnosis not present

## 2019-09-04 DIAGNOSIS — Z20822 Contact with and (suspected) exposure to covid-19: Secondary | ICD-10-CM | POA: Diagnosis not present

## 2019-09-04 DIAGNOSIS — I639 Cerebral infarction, unspecified: Secondary | ICD-10-CM | POA: Diagnosis not present

## 2019-09-04 DIAGNOSIS — I4891 Unspecified atrial fibrillation: Secondary | ICD-10-CM | POA: Diagnosis not present

## 2019-09-04 DIAGNOSIS — R52 Pain, unspecified: Secondary | ICD-10-CM | POA: Diagnosis not present

## 2019-09-04 DIAGNOSIS — I10 Essential (primary) hypertension: Secondary | ICD-10-CM | POA: Diagnosis not present

## 2019-09-04 DIAGNOSIS — N179 Acute kidney failure, unspecified: Secondary | ICD-10-CM | POA: Diagnosis not present

## 2019-09-04 DIAGNOSIS — K920 Hematemesis: Secondary | ICD-10-CM

## 2019-09-04 DIAGNOSIS — F0390 Unspecified dementia without behavioral disturbance: Secondary | ICD-10-CM | POA: Diagnosis not present

## 2019-09-04 DIAGNOSIS — R6510 Systemic inflammatory response syndrome (SIRS) of non-infectious origin without acute organ dysfunction: Secondary | ICD-10-CM | POA: Diagnosis not present

## 2019-09-04 DIAGNOSIS — K2101 Gastro-esophageal reflux disease with esophagitis, with bleeding: Secondary | ICD-10-CM | POA: Diagnosis not present

## 2019-09-04 HISTORY — PX: HOT HEMOSTASIS: SHX5433

## 2019-09-04 HISTORY — PX: BIOPSY: SHX5522

## 2019-09-04 HISTORY — PX: ESOPHAGOGASTRODUODENOSCOPY (EGD) WITH PROPOFOL: SHX5813

## 2019-09-04 HISTORY — PX: SUBMUCOSAL INJECTION: SHX5543

## 2019-09-04 LAB — CBC WITH DIFFERENTIAL/PLATELET
Abs Immature Granulocytes: 0.44 10*3/uL — ABNORMAL HIGH (ref 0.00–0.07)
Basophils Absolute: 0 10*3/uL (ref 0.0–0.1)
Basophils Relative: 0 %
Eosinophils Absolute: 0.3 10*3/uL (ref 0.0–0.5)
Eosinophils Relative: 2 %
HCT: 26.3 % — ABNORMAL LOW (ref 36.0–46.0)
Hemoglobin: 8.2 g/dL — ABNORMAL LOW (ref 12.0–15.0)
Immature Granulocytes: 3 %
Lymphocytes Relative: 10 %
Lymphs Abs: 1.3 10*3/uL (ref 0.7–4.0)
MCH: 29.6 pg (ref 26.0–34.0)
MCHC: 31.2 g/dL (ref 30.0–36.0)
MCV: 94.9 fL (ref 80.0–100.0)
Monocytes Absolute: 0.9 10*3/uL (ref 0.1–1.0)
Monocytes Relative: 7 %
Neutro Abs: 10 10*3/uL — ABNORMAL HIGH (ref 1.7–7.7)
Neutrophils Relative %: 78 %
Platelets: 399 10*3/uL (ref 150–400)
RBC: 2.77 MIL/uL — ABNORMAL LOW (ref 3.87–5.11)
RDW: 14.3 % (ref 11.5–15.5)
WBC: 13 10*3/uL — ABNORMAL HIGH (ref 4.0–10.5)
nRBC: 0.2 % (ref 0.0–0.2)

## 2019-09-04 LAB — MAGNESIUM: Magnesium: 1.9 mg/dL (ref 1.7–2.4)

## 2019-09-04 LAB — COMPREHENSIVE METABOLIC PANEL
ALT: 31 U/L (ref 0–44)
AST: 20 U/L (ref 15–41)
Albumin: 2.7 g/dL — ABNORMAL LOW (ref 3.5–5.0)
Alkaline Phosphatase: 61 U/L (ref 38–126)
Anion gap: 11 (ref 5–15)
BUN: 77 mg/dL — ABNORMAL HIGH (ref 8–23)
CO2: 29 mmol/L (ref 22–32)
Calcium: 8.8 mg/dL — ABNORMAL LOW (ref 8.9–10.3)
Chloride: 101 mmol/L (ref 98–111)
Creatinine, Ser: 1.84 mg/dL — ABNORMAL HIGH (ref 0.44–1.00)
GFR calc Af Amer: 29 mL/min — ABNORMAL LOW (ref >60)
GFR calc non Af Amer: 25 mL/min — ABNORMAL LOW (ref >60)
Glucose, Bld: 226 mg/dL — ABNORMAL HIGH (ref 70–99)
Potassium: 4.2 mmol/L (ref 3.5–5.1)
Sodium: 141 mmol/L (ref 135–145)
Total Bilirubin: 0.6 mg/dL (ref 0.3–1.2)
Total Protein: 5.7 g/dL — ABNORMAL LOW (ref 6.5–8.1)

## 2019-09-04 LAB — PHOSPHORUS: Phosphorus: 2.4 mg/dL — ABNORMAL LOW (ref 2.5–4.6)

## 2019-09-04 LAB — HEMOGLOBIN AND HEMATOCRIT, BLOOD
HCT: 25 % — ABNORMAL LOW (ref 36.0–46.0)
HCT: 21.8 % — ABNORMAL LOW (ref 36.0–46.0)
Hemoglobin: 7.8 g/dL — ABNORMAL LOW (ref 12.0–15.0)
Hemoglobin: 6.7 g/dL — CL (ref 12.0–15.0)

## 2019-09-04 LAB — GLUCOSE, CAPILLARY
Glucose-Capillary: 225 mg/dL — ABNORMAL HIGH (ref 70–99)
Glucose-Capillary: 280 mg/dL — ABNORMAL HIGH (ref 70–99)
Glucose-Capillary: 296 mg/dL — ABNORMAL HIGH (ref 70–99)
Glucose-Capillary: 204 mg/dL — ABNORMAL HIGH (ref 70–99)
Glucose-Capillary: 146 mg/dL — ABNORMAL HIGH (ref 70–99)

## 2019-09-04 LAB — PREPARE RBC (CROSSMATCH)

## 2019-09-04 SURGERY — ESOPHAGOGASTRODUODENOSCOPY (EGD) WITH PROPOFOL
Anesthesia: Monitor Anesthesia Care

## 2019-09-04 MED ORDER — EPINEPHRINE 1 MG/10ML IJ SOSY
PREFILLED_SYRINGE | INTRAMUSCULAR | Status: AC
  Filled 2019-09-04: qty 10

## 2019-09-04 MED ORDER — PANTOPRAZOLE SODIUM 40 MG IV SOLR: 40.0000 mg | Freq: Two times a day (BID) | INTRAVENOUS | Status: DC

## 2019-09-04 MED ORDER — SODIUM CHLORIDE (PF) 0.9 % IJ SOLN
PREFILLED_SYRINGE | INTRAMUSCULAR | Status: DC | PRN
  Administered 2019-09-04: 14:00:00 3.5 mL

## 2019-09-04 MED ORDER — SODIUM CHLORIDE 0.9% IV SOLUTION: Freq: Once | INTRAVENOUS | Status: AC

## 2019-09-04 MED ORDER — DILTIAZEM HCL ER COATED BEADS 240 MG PO CP24: 240.0000 mg | ORAL_CAPSULE | Freq: Every day | ORAL | Status: DC

## 2019-09-04 MED ORDER — K PHOS MONO-SOD PHOS DI & MONO 155-852-130 MG PO TABS
500.0000 mg | ORAL_TABLET | Freq: Once | ORAL | Status: DC
  Filled 2019-09-04: qty 2

## 2019-09-04 MED ORDER — LIDOCAINE 2% (20 MG/ML) 5 ML SYRINGE
INTRAMUSCULAR | Status: DC | PRN
  Administered 2019-09-04 (×2): 40 mg via INTRAVENOUS

## 2019-09-04 MED ORDER — SODIUM CHLORIDE 0.9 % IV SOLN
80.0000 mg | Freq: Once | INTRAVENOUS | Status: AC
  Administered 2019-09-04: 80 mg via INTRAVENOUS
  Filled 2019-09-04: qty 80

## 2019-09-04 MED ORDER — PROPOFOL 10 MG/ML IV BOLUS
INTRAVENOUS | Status: DC | PRN
  Administered 2019-09-04 (×3): 30 mg via INTRAVENOUS
  Administered 2019-09-04: 10 mg via INTRAVENOUS
  Administered 2019-09-04: 20 mg via INTRAVENOUS

## 2019-09-04 MED ORDER — SODIUM CHLORIDE 0.9 % IV SOLN: INTRAVENOUS | Status: DC | PRN

## 2019-09-04 MED ORDER — SODIUM CHLORIDE 0.9 % IV SOLN
8.0000 mg/h | INTRAVENOUS | Status: DC
  Administered 2019-09-04 – 2019-09-05 (×2): 8 mg/h via INTRAVENOUS
  Filled 2019-09-04 (×5): qty 80

## 2019-09-04 MED ORDER — SODIUM CHLORIDE 0.9 % IV SOLN: INTRAVENOUS | Status: DC

## 2019-09-04 SURGICAL SUPPLY — 15 items

## 2019-09-04 NOTE — Brief Op Note (Signed)
08/11/2019 - 09/26/2019  2:21 PM  PATIENT:  Natalie Rodgers  79 y.o. female  PRE-OPERATIVE DIAGNOSIS:  GI bleed  POST-OPERATIVE DIAGNOSIS:  esophagitis,duodenitis,duodenal ulcer  PROCEDURE:  Procedure(s): ESOPHAGOGASTRODUODENOSCOPY (EGD) WITH PROPOFOL (N/A) HOT HEMOSTASIS (ARGON PLASMA COAGULATION/BICAP) (N/A) SUBMUCOSAL INJECTION BIOPSY  SURGEON:  Surgeon(s) and Role:    * Laurieann Friddle, MD - Primary  Findings ---------- -EGD showed mid to distal LA grade D esophagitis. -EGD also showed large duodenal sweep ulcer with visible vessel and blood clots.  It was treated with epinephrine injection and gold probe cautery.  Successful hemostasis achieved.   Recommendations --------------------- -Keep n.p.o. for now -Okay to have clears after 4 hours -Continue Protonix drip for now -If ongoing bleeding consider repeat endoscopy versus intervention radiology consult for embolization of gastroduodenal artery -Findings discussed with patient's daughter over the phone. -Monitor H&H -GI will follow  Otis Brace MD, [place] 08/28/2019, 2:25 PM  Contact #  [phone]

## 2019-09-04 NOTE — Progress Notes (Signed)
PT Cancellation Note  Patient Details Name: Brittani Purdum MRN: [id number] DOB: 02/13/1941   Cancelled Treatment:    Reason Eval/Treat Not Completed: Patient at procedure or test/unavailable. Pt being taken for endo. Will check back as schedule permits.   Talbot Grumbling PT, DPT 09/22/2019, 12:32 PM [phone]

## 2019-09-04 NOTE — Progress Notes (Addendum)
Patient vomited coffee gound emesis, MD notified via amion. New orders placed by MD. Will continue to monitor patient.

## 2019-09-04 NOTE — Anesthesia Procedure Notes (Signed)
Procedure Name: MAC Date/Time: 09/13/2019 1:36 PM Performed by: Cynda Familia, CRNA Pre-anesthesia Checklist: Patient identified, Emergency Drugs available, Suction available, Patient being monitored and Timeout performed Patient Re-evaluated:Patient Re-evaluated prior to induction Oxygen Delivery Method: Simple face mask Placement Confirmation: positive ETCO2 and breath sounds checked- equal and bilateral Dental Injury: Teeth and Oropharynx as per pre-operative assessment

## 2019-09-04 NOTE — Transfer of Care (Signed)
Immediate Anesthesia Transfer of Care Note  Patient: Natalie Rodgers  Procedure(s) Performed: ESOPHAGOGASTRODUODENOSCOPY (EGD) WITH PROPOFOL (N/A ) HOT HEMOSTASIS (ARGON PLASMA COAGULATION/BICAP) (N/A ) SUBMUCOSAL INJECTION BIOPSY  Patient Location: PACU and Endoscopy Unit  Anesthesia Type:MAC  Level of Consciousness: awake and alert   Airway & Oxygen Therapy: Patient Spontanous Breathing and Patient connected to face mask oxygen  Post-op Assessment: Report given to RN and Post -op Vital signs reviewed and stable  Post vital signs: Reviewed and stable  Last Vitals:  Vitals Value Taken Time  BP    Temp    Pulse 84 09/17/2019 1420  Resp 27 09/12/2019 1420  SpO2 100 % 09/24/2019 1420  Vitals shown include unvalidated device data.  Last Pain:  Vitals:   09/06/2019 1247  TempSrc: Oral  PainSc: 0-No pain         Complications: No apparent anesthesia complications

## 2019-09-04 NOTE — Progress Notes (Signed)
CRITICAL VALUE ALERT  Critical Value:  Hbg 6.7   Date & Time Notied:  09/19/2019 20:40   Provider Notified: NP M. Sharlet Salina  Orders Received/Actions taken: Paged provider RN awaiting orders

## 2019-09-04 NOTE — Progress Notes (Signed)
Progress Note  Patient Name: Natalie Rodgers Date of Encounter: September 06, 2019  Primary Cardiologist: Armanda Magic, MD   Subjective   Incidental acute/subacute left cerebellar infarct on brain MRI. Still trying to find more information on ICH in 2016.   Patient states she is hungry. Denies chest pain. Remains in sinus rhythm.  Inpatient Medications    Scheduled Meds: . aspirin EC  81 mg Oral Daily  . benazepril  20 mg Oral Daily  . carvedilol  25 mg Oral BID WC  . cefdinir  300 mg Oral Daily  . Chlorhexidine Gluconate Cloth  6 each Topical Daily  . diclofenac Sodium  4 g Topical QID  . diltiazem  180 mg Oral Daily  . donepezil  10 mg Oral QHS  . ezetimibe  10 mg Oral Daily  . fenofibrate  160 mg Oral Daily  . insulin aspart  0-5 Units Subcutaneous QHS  . insulin aspart  0-9 Units Subcutaneous TID WC  . isosorbide-hydrALAZINE  1 tablet Oral TID  . melatonin  2 mg Oral QHS  . pravastatin  80 mg Oral QHS  . QUEtiapine  25 mg Oral Daily  . QUEtiapine  50 mg Oral QHS  . senna  1 tablet Oral BID  . sodium chloride flush  3 mL Intravenous Q12H   Continuous Infusions: . sodium chloride Stopped (08/30/19 0911)   PRN Meds: sodium chloride, acetaminophen **OR** acetaminophen, albuterol, diphenhydrAMINE, hydrALAZINE, labetalol, magnesium hydroxide, ondansetron **OR** ondansetron (ZOFRAN) IV, Racepinephrine HCl, sodium phosphate, sorbitol   Vital Signs    Vitals:   09/03/19 0437 09/03/19 1330 09/03/19 2151 September 06, 2019 0411  BP: 139/62 (!) 110/56 (!) 115/55 133/62  Pulse: 99 75 69 89  Resp: 16  20 20   Temp: (!) 97.5 F (36.4 C) 97.7 F (36.5 C) 97.8 F (36.6 C) 98.3 F (36.8 C)  TempSrc:  Oral    SpO2: 96% 97% 92% 94%  Weight:      Height:        Intake/Output Summary (Last 24 hours) at 09-06-2019 0744 Last data filed at 09/03/2019 1700 Gross per 24 hour  Intake 720 ml  Output --  Net 720 ml   Last 3 Weights 08/27/2019 08/02/2019  Weight (lbs) 149 lb 7.6 oz 185 lb   Weight (kg) 67.8 kg 83.915 kg      Telemetry    NSR< HR 70-80s, no other arrhythmias noted - Personally Reviewed  ECG    No new- Personally Reviewed  Physical Exam   GEN: No acute distress.   Neck: No JVD Cardiac: RRR, no murmurs, rubs, or gallops.  Respiratory: Clear to auscultation bilaterally. GI: Soft, nontender, non-distended  MS: No edema; No deformity. Neuro:  A&Ox1 Psych: Normal affect   Labs    High Sensitivity Troponin:   Recent Labs  Lab 08/30/19 1818 08/30/19 2004  TROPONINIHS 14 15      Chemistry Recent Labs  Lab 09/02/19 0518 09/03/19 0555 2019-09-06 0603  NA 143 144 141  K 3.8 3.8 4.2  CL 109 109 101  CO2 25 24 29   GLUCOSE 188* 187* 226*  BUN 42* 53* 77*  CREATININE 1.38* 1.66* 1.84*  CALCIUM 8.7* 8.8* 8.8*  PROT  --   --  5.7*  ALBUMIN  --   --  2.7*  AST  --   --  20  ALT  --   --  31  ALKPHOS  --   --  61  BILITOT  --   --  0.6  GFRNONAA 36* 29* 25*  GFRAA 42* 33* 29*  ANIONGAP 9 11 11      Hematology Recent Labs  Lab 09/01/19 0534 09/03/19 0555 Oct 02, 2019 0603  WBC 12.5* 12.6* 13.0*  RBC 3.36* 3.00* 2.77*  HGB 10.1* 9.0* 8.2*  HCT 31.9* 28.8* 26.3*  MCV 94.9 96.0 94.9  MCH 30.1 30.0 29.6  MCHC 31.7 31.3 31.2  RDW 14.4 14.3 14.3  PLT 301 322 399    BNPNo results for input(s): BNP, PROBNP in the last 168 hours.   DDimer No results for input(s): DDIMER in the last 168 hours.   Radiology    MR BRAIN WO CONTRAST  Result Date: 09/03/2019 CLINICAL DATA:  Anticoagulation for atrial fibrillation, history of intracranial hemorrhage EXAM: MRI HEAD WITHOUT CONTRAST TECHNIQUE: Multiplanar, multiecho pulse sequences of the brain and surrounding structures were obtained without intravenous contrast. COMPARISON:  None. FINDINGS: Brain: There is a punctate focus of mildly reduced diffusion within the anterior inferior left cerebellum. Patchy and confluent areas of T2 hyperintensity in the supratentorial white matter are nonspecific may  reflect moderate chronic microvascular ischemic changes. There are chronic small vessel infarcts of the basal ganglia and adjacent white matter bilaterally as well as the thalamus bilaterally. Possible chronic blood products associated with right thalamic infarct. There is a small chronic cortical infarct of the inferior left temporal lobe. Small focus of susceptibility the left temporal lobe also likely reflects chronic microhemorrhage. Prominence of the ventricles and sulci reflects generalized parenchymal volume loss. There is no intracranial mass, mass effect, or edema. There is no hydrocephalus or extra-axial fluid collection. Vascular: Major vessel flow voids at the skull base are preserved. Skull and upper cervical spine: Normal marrow signal is preserved. Sinuses/Orbits: Paranasal sinuses are aerated. Orbits are unremarkable. Other: Sella is unremarkable. Mild patchy mastoid fluid opacification. 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 Moderate chronic microvascular ischemic changes. 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 No evidence of recent hemorrhage. Minor chronic blood products are noted. Electronically Signed   By: Guadlupe Spanish M.D.   On: 09/03/2019 07:17    Cardiac Studies   Echo 08/30/19 1. Hyperdynamic LV systolic function; grade 1 diastolic dysfunction;  elevated mean gradient across aortic valve likely related to vigorous LV  function as visually aortic valve opens well.  2. Left ventricular ejection fraction, by estimation, is >75%. The left  ventricle has hyperdynamic function. The left ventricle has no regional  wall motion abnormalities. Left ventricular diastolic parameters are  consistent with Grade I diastolic  dysfunction (impaired relaxation).  3. Right ventricular systolic function is normal. The right ventricular  size is normal.  4. The mitral valve is normal in structure. No evidence of  mitral valve  regurgitation. No evidence of mitral stenosis.  5. The aortic valve is tricuspid. Aortic valve regurgitation is not  visualized. Mild to moderate aortic valve sclerosis/calcification is  present, without any evidence of aortic stenosis.  6. The inferior vena cava is normal in size with greater than 50%  respiratory variability, suggesting right atrial pressure of 3 mmHg.   Patient Profile     79 y.o. female  with a hx of hypertension, hyperlipidemia, diabetes type 2, CKDstage 4, dementia,CVA in 2002, possibleintracranial hemorrhage in 2016 with right-sided hemiparesis,chronic anemiawho is being seen for the evaluation of new onset atrial fibrillation.  Assessment & Plan   Sepsis/bilateral foot cellulitis and myositis -Present on admission -Urine culture negative -COVID-19 and influenza  negative -Recent chestx-ray unremarkable -Left foot x-ray with possible evidence of infection>>improving - blood cultures with no growth -IV antibiotics per IM  A. fib RVR -This isa new diagnosis in the setting of SEPSIS/foot cellulitis  - Developed A. fib with RVR and wasstarted on IVdilt.EKG 08/30/2019 showed A. fib rate 169. Patient converted to sinus4/4and IV dilt was transitioned to home Coreg25 mg BID -Patient remains in sinus rhythm - Echo this admission showed EF >75%, G1DD -CHA2DS2-VASc= 7 (age, female, hypertension, DM, stroke) This patients CHA2DS2-VASc Score and unadjusted Ischemic Stroke Rate (% per year) is equal to11.2 % stroke rate/year from a score of 7  Above score calculated as 1 point each if present [CHF, HTN, DM, Vascular=MI/PAD/Aortic Plaque, Age if 65-74, or Female] Above score calculated as 2 points each if present [Age >75, or Stroke/TIA/TE] -Patient haspossiblehistory of intracranial hemorrhage in 2016 although unable to locate these documents. Neurosurgery consulted and recommended MRI brain which showed incidentalsmall punctate  infarct in the left cerebellum.  - Neurosurgery do nor recommend a/c if she had spontaneous hemorrhagic stroke or if she had a stroke 2/2 amyloid. Still trying to obtain more info regarding stroke. If the later did not occur than recommending Eliquis 2.5mg  BID  Acute/subacute left cerebellar infarct - incidental finding on brain MRI - recommend Eliquis 2.5mg  - carotid doppler - PT/OT - A&Ox1 patients baseline  Hypertension -Antihypertensives held foranaphylacticshock>>now resolved -[place] patient was on Lotrel 10-20 mg daily, Coreg 25 mg twice daily, clonidine 0.1 mg twice daily, BiDil 20-37.5mg 3 times daily>>  - Clonidine and amlodipine discontinued - Cardizem 180 mg added -Pressures stable  Hyperlipidemia -At home patient on pravastatin 80 mg daily, fenofibrate 160 mg daily Zetia 10 mg daily -LDL 43, HDL 35, TG 144  CKD stage IV -Baselinearound2.2  - today 1.84  For questions or updates, please contact [facility] Please consult [email or web address] for contact info under        Signed, Tryphena Perkovich David Stall, PA-C  09/09/2019, 7:44 AM

## 2019-09-04 NOTE — Op Note (Signed)
[facility] Patient Name: Natalie Rodgers Procedure Date: 09/17/2019 MRN: [id number] Attending MD: Otis Brace , MD Date of Birth: 1940-08-07 CSN: [id number] Age: 79 Admit Type: Inpatient Procedure:                Upper GI endoscopy Indications:              Coffee-ground emesis, Melena Providers:                Otis Brace, MD, Cleda Daub, RN, Janeece Agee, Technician Referring MD:              Medicines:                Sedation Administered by an Anesthesia Professional Complications:            No immediate complications. Estimated Blood Loss:     Estimated blood loss was minimal. Procedure:                Pre-Anesthesia Assessment:                           - Prior to the procedure, a History and Physical                            was performed, and patient medications and                            allergies were reviewed. The patient's tolerance of                            previous anesthesia was also reviewed. The risks                            and benefits of the procedure and the sedation                            options and risks were discussed with the patient.                            All questions were answered, and informed consent                            was obtained. Prior Anticoagulants: The patient has                            taken no previous anticoagulant or antiplatelet                            agents except for aspirin. ASA Grade Assessment: IV                            - A patient with severe systemic disease that is a  constant threat to life. After reviewing the risks                            and benefits, the patient was deemed in                            satisfactory condition to undergo the procedure.                           After obtaining informed consent, the endoscope was                            passed under direct vision. Throughout the                     procedure, the patient's blood pressure, pulse, and                            oxygen saturations were monitored continuously. The                            GIF-H190 ([id number]) was introduced through the                            mouth, and advanced to the second part of duodenum.                            The upper GI endoscopy was accomplished without                            difficulty. The patient tolerated the procedure                            well. Scope In: Scope Out: Findings:      LA Grade D (one or more mucosal breaks involving at least 75% of       esophageal circumference) esophagitis with bleeding was found in the       lower third of the esophagus.      Hematin (altered blood/coffee-ground-like material) was found in the       gastric fundus. Biopsies were taken with a cold forceps for histology.      There is no endoscopic evidence of bleeding in the entire examined       stomach.      One non-obstructing non-bleeding cratered duodenal ulcer with a visible       vessel and blood clot was found in the first portion of the duodenum.       The lesion was 15 mm in largest dimension. Area was successfully       injected with 3 mL of a 1:10,000 solution of epinephrine for hemostasis.       After that, blood clots were removed. Fulguration to ablate the lesion       to prevent bleeding by Rolanda Lundborg was successful.      Few non-bleeding superficial duodenal ulcers were found in the duodenal       bulb. Impression:               -  LA Grade D esophagitis with bleeding.                           - Hematin (altered blood/coffee-ground-like                            material) in the gastric fundus. Biopsied.                           - Non-obstructing non-bleeding duodenal ulcer with                            a visible vessel. Injected. SKIP.                           - Non-bleeding duodenal ulcers. Moderate Sedation:      Moderate (conscious) sedation was  personally administered by an       anesthesia professional. The following parameters were monitored: oxygen       saturation, heart rate, blood pressure, and response to care. Recommendation:           - NPO.                           - Continue present medications.                           - Await pathology results.                           - Repeat upper endoscopy PRN for retreatment. Procedure Code(s):        --- Professional ---                           70350, 57, Esophagogastroduodenoscopy, flexible,                            transoral; with control of bleeding, any method                           43239, Esophagogastroduodenoscopy, flexible,                            transoral; with biopsy, single or multiple Diagnosis Code(s):        --- Professional ---                           K20.91, Esophagitis, unspecified with bleeding                           K92.2, Gastrointestinal hemorrhage, unspecified                           K26.4, Chronic or unspecified duodenal ulcer with                            hemorrhage  K26.9, Duodenal ulcer, unspecified as acute or                            chronic, without hemorrhage or perforation                           K92.0, Hematemesis                           K92.1, Melena (includes Hematochezia) CPT copyright 2019 [employer]. All rights reserved. The codes documented in this report are preliminary and upon coder review may  be revised to meet current compliance requirements. Otis Brace, MD Otis Brace, MD 09/03/2019 2:17:29 PM Number of Addenda: 0

## 2019-09-04 NOTE — Consult Note (Addendum)
Referring Provider: Dr. Alfredia Ferguson Primary Care Physician:  Welford Roche, NP Primary Gastroenterologist: Althia Forts  Reason for Consultation: Upper GI bleed  HPI: Natalie Rodgers is a 79 y.o. female with past medical history of HTN, DM type 2, CKD stage 4, dementia, CVA, and new-onset A. fib with RVR presenting with coffee-ground emesis.   Per RN, patient had a small amount of coffee-ground emesis this morning.  Then, later this morning, she had a large amount of black emesis with tinges of red and a blood-like odor.  She also had two soft, melenic stools today.  Due to dementia, patient unable to provide subjective information.  Only information gained from lesion is that she was vomiting earlier today.  Spoke to patient's daughter, Verdis Frederickson, who stated that patient did not have any hematemesis or melenic or bloody stools prior to admission.  She stated patient was not on blood thinners prior to hospitalization.  Anticoagulation has not been initiated due to prior hemorrhagic CVA.  Patient was undergoing work-up to determine safety of anticoagulation.  Hgb of 8.2 this morning, decreased from 9.0 yesterday and 10.1 on 4/5.  Today, BUN 77/Cr 1.84 increased from BUN 53/Cr 1.66.  GFR of 25 today, decreased from GFR 29 yesterday.  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  Records mention normal colonoscopy in 2013 but no report found via chart review.  Past Medical History:  Diagnosis Date   A-fib ([place])    Chronic kidney disease    CKD Stage IV   Dementia (HCC)    Diabetes mellitus without complication (HCC)    GERD (gastroesophageal reflux disease)    Hyperlipidemia    Hypertension     Past Surgical History:  Procedure Laterality Date   ABDOMINAL HYSTERECTOMY     salpinoophorectomy   LAPAROTOMY      Prior to Admission medications   Medication Sig Start Date End Date Taking? Authorizing Provider  amLODipine-benazepril (LOTREL) 10-20  MG capsule Take 1 capsule by mouth daily.   Yes [provider]  aspirin EC 81 MG tablet Take 81 mg by mouth daily.   Yes [provider]  carvedilol (COREG) 25 MG tablet Take 25 mg by mouth 2 (two) times daily with a meal.   Yes [provider]  chlorthalidone (HYGROTON) 25 MG tablet Take 12.5 mg by mouth daily.   Yes [provider]  cloNIDine (CATAPRES) 0.1 MG tablet Take 0.1 mg by mouth 2 (two) times daily.   Yes [provider]  Cranberry 450 MG TABS Take 450 mg by mouth daily.   Yes [provider]  donepezil (ARICEPT) 10 MG tablet Take 10 mg by mouth at bedtime.   Yes [provider]  ezetimibe (ZETIA) 10 MG tablet Take 10 mg by mouth daily.   Yes [provider]  famotidine (PEPCID) 20 MG tablet Take 20 mg by mouth at bedtime.   Yes [provider]  fenofibrate 160 MG tablet Take 160 mg by mouth daily.   Yes [provider]  glipiZIDE (GLUCOTROL) 10 MG tablet Take 10 mg by mouth daily before breakfast.   Yes [provider]  isosorbide-hydrALAZINE (BIDIL) 20-37.5 MG tablet Take 1 tablet by mouth 3 (three) times daily.   Yes [provider]  melatonin 1 MG TABS tablet Take 2 mg by mouth at bedtime.   Yes [provider]  pravastatin (PRAVACHOL) 80 MG tablet Take 80 mg by mouth at bedtime.   Yes [provider]  QUEtiapine (SEROQUEL) 25 MG tablet Take 25 mg by mouth daily.   Yes [provider]  QUEtiapine (SEROQUEL) 50 MG tablet Take 50 mg by mouth at bedtime.   Yes [provider]  sitaGLIPtin (JANUVIA) 25 MG tablet Take 25 mg by mouth daily.   Yes [provider]  Vitamin D3 (VITAMIN D) 25 MCG tablet Take 2,000 Units by mouth daily.   Yes [provider]    Scheduled Meds:  benazepril  20 mg Oral Daily   carvedilol  25 mg Oral BID WC   cefdinir  300 mg Oral Daily   Chlorhexidine Gluconate Cloth  6 each Topical Daily    diclofenac Sodium  4 g Topical QID   diltiazem  180 mg Oral Daily   donepezil  10 mg Oral QHS   ezetimibe  10 mg Oral Daily   fenofibrate  160 mg Oral Daily   insulin aspart  0-5 Units Subcutaneous QHS   insulin aspart  0-9 Units Subcutaneous TID WC   isosorbide-hydrALAZINE  1 tablet Oral TID   melatonin  2 mg Oral QHS   [START ON 09/07/2019] pantoprazole  40 mg Intravenous Q12H   phosphorus  500 mg Oral Once   pravastatin  80 mg Oral QHS   QUEtiapine  25 mg Oral Daily   QUEtiapine  50 mg Oral QHS   senna  1 tablet Oral BID   sodium chloride flush  3 mL Intravenous Q12H   Continuous Infusions:  sodium chloride Stopped (08/30/19 0911)   pantoprozole (PROTONIX) infusion     pantoprazole (PROTONIX) IVPB     PRN Meds:.sodium chloride, acetaminophen **OR** acetaminophen, albuterol, diphenhydrAMINE, hydrALAZINE, labetalol, magnesium hydroxide, ondansetron **OR** ondansetron (ZOFRAN) IV, Racepinephrine HCl, sodium phosphate, sorbitol  Allergies as of 08/08/2019   (No Known Allergies)    History reviewed. No pertinent family history.  Social History   Socioeconomic History   Marital status: Divorced    Spouse name: Not on file   Number of children: Not on file   Years of education: Not on file   Highest education level: Not on file  Occupational History   Not on file  Tobacco Use   Smoking status: Never Smoker   Smokeless tobacco: Never Used  Substance and Sexual Activity   Alcohol use: Never   Drug use: Never   Sexual activity: Not on file  Other Topics Concern   Not on file  Social History Narrative   Not on file   Social Determinants of Health   Financial Resource Strain:    Difficulty of Paying Living Expenses:   Food Insecurity:    Worried About Charity fundraiser in the Last Year:    Arboriculturist in the Last Year:   Transportation Needs:    Film/video editor (Medical):    Lack of Transportation (Non-Medical):   Physical Activity:    Days of  Exercise per Week:    Minutes of Exercise per Session:   Stress:    Feeling of Stress :   Social Connections:    Frequency of Communication with Friends and Family:    Frequency of Social Gatherings with Friends and Family:    Attends Religious Services:    Active Member of Clubs or Organizations:    Attends Archivist Meetings:    Marital Status:   Intimate Partner Violence:    Fear of Current or Ex-Partner:    Emotionally Abused:    Physically Abused:  Sexually Abused:     Review of Systems: ROS unable to be obtained due to patient's mental status (dementia).  Physical Exam: Vital signs: Vitals:   09/14/2019 0411 09/26/2019 1037  BP: 133/62 (!) 141/70  Pulse: 89 93  Resp: 20 20  Temp: 98.3 F (36.8 C) 97.8 F (36.6 C)  SpO2: 94% 92%   Last BM Date: 09/01/19  Physical Exam  Constitutional: She appears well-developed and well-nourished. She appears lethargic. No distress.  HENT:  Head: Normocephalic and atraumatic.  Eyes: EOM are normal. No scleral icterus.  Conjunctival pallor  Cardiovascular: Normal rate, regular rhythm, normal heart sounds and intact distal pulses.  Pulmonary/Chest: Effort normal and breath sounds normal. No respiratory distress.  Abdominal: Soft. Bowel sounds are normal. She exhibits no distension and no mass. There is abdominal tenderness (diffuse). There is no rebound and no guarding.  Musculoskeletal:        General: No deformity or edema.     Cervical back: Normal range of motion and neck supple.  Neurological: She appears lethargic.  disoriented  Skin: Skin is warm and dry.  Psychiatric: She has a normal mood and affect. Her behavior is normal.    GI:  Lab Results: Recent Labs    09/03/19 0555 09/08/2019 0603  WBC 12.6* 13.0*  HGB 9.0* 8.2*  HCT 28.8* 26.3*  PLT 322 399   BMET Recent Labs    09/02/19 0518 09/03/19 0555 09/25/2019 0603  NA 143 144 141  K 3.8 3.8 4.2  CL 109 109 101  CO2 25 24 29   GLUCOSE 188* 187*  226*  BUN 42* 53* 77*  CREATININE 1.38* 1.66* 1.84*  CALCIUM 8.7* 8.8* 8.8*   LFT Recent Labs    09/13/2019 0603  PROT 5.7*  ALBUMIN 2.7*  AST 20  ALT 31  ALKPHOS 61  BILITOT 0.6   PT/INR No results for input(s): LABPROT, INR in the last 72 hours.   Studies/Results: MR BRAIN WO CONTRAST  Result Date: 09/03/2019 CLINICAL DATA:  Anticoagulation for atrial fibrillation, history of intracranial hemorrhage EXAM: MRI HEAD WITHOUT CONTRAST TECHNIQUE: Multiplanar, multiecho pulse sequences of the brain and surrounding structures were obtained without intravenous contrast. COMPARISON:  None. FINDINGS: Brain: There is a punctate focus of mildly reduced diffusion within the anterior inferior left cerebellum. Patchy and confluent areas of T2 hyperintensity in the supratentorial white matter are nonspecific may reflect moderate chronic microvascular ischemic changes. There are chronic small vessel infarcts of the basal ganglia and adjacent white matter bilaterally as well as the thalamus bilaterally. Possible chronic blood products associated with right thalamic infarct. There is a small chronic cortical infarct of the inferior left temporal lobe. Small focus of susceptibility the left temporal lobe also likely reflects chronic microhemorrhage. Prominence of the ventricles and sulci reflects generalized parenchymal volume loss. There is no intracranial mass, mass effect, or edema. There is no hydrocephalus or extra-axial fluid collection. Vascular: Major vessel flow voids at the skull base are preserved. Skull and upper cervical spine: Normal marrow signal is preserved. Sinuses/Orbits: Paranasal sinuses are aerated. Orbits are unremarkable. Other: Sella is unremarkable. Mild patchy mastoid fluid opacification. 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 Moderate chronic microvascular ischemic changes. Bilateral chronic small vessel infarcts of the deep gray nuclei and adjacent white  matter. No evidence of recent hemorrhage. Minor chronic blood products are noted. Electronically Signed   By: Macy Mis M.D.   On: 09/03/2019 07:17   VAS US CAROTID  Result Date:  09/26/2019 Carotid Arterial Duplex Study Indications:       CVA. Risk Factors:      None. Limitations        Today's exam was limited due to the body habitus of the                    patient, the patient's inability or unwillingness to                    cooperate and patient positioning. Comparison Study:  No prior studies. Performing Technologist: Oliver Hum RVT  Examination Guidelines: A complete evaluation includes B-mode imaging, spectral Doppler, color Doppler, and power Doppler as needed of all accessible portions of each vessel. Bilateral testing is considered an integral part of a complete examination. Limited examinations for reoccurring indications may be performed as noted.  Right Carotid Findings: +----------+--------+--------+--------+-----------------------+--------+           PSV cm/sEDV cm/sStenosisPlaque Description     Comments +----------+--------+--------+--------+-----------------------+--------+ CCA Prox  49      15              smooth and heterogenous         +----------+--------+--------+--------+-----------------------+--------+ CCA Distal48      14              smooth and heterogenous         +----------+--------+--------+--------+-----------------------+--------+ ICA Prox  48      19              smooth and heterogenous         +----------+--------+--------+--------+-----------------------+--------+ ICA Distal62      23                                     tortuous +----------+--------+--------+--------+-----------------------+--------+ ECA       74      10                                              +----------+--------+--------+--------+-----------------------+--------+ +----------+--------+-------+--------+-------------------+           PSV cm/sEDV  cmsDescribeArm Pressure (mmHG) +----------+--------+-------+--------+-------------------+ [id number]                                         +----------+--------+-------+--------+-------------------+ +---------+--------+--+--------+--+---------+ VertebralPSV cm/s47EDV cm/s12Antegrade +---------+--------+--+--------+--+---------+  Left Carotid Findings: +----------+--------+--------+--------+-----------------------+--------+           PSV cm/sEDV cm/sStenosisPlaque Description     Comments +----------+--------+--------+--------+-----------------------+--------+ CCA Prox  59      13              smooth and heterogenous         +----------+--------+--------+--------+-----------------------+--------+ CCA Distal40      7               smooth and heterogenous         +----------+--------+--------+--------+-----------------------+--------+ ICA Prox  39      13              smooth and heterogenous         +----------+--------+--------+--------+-----------------------+--------+ ICA Distal43      18  tortuous +----------+--------+--------+--------+-----------------------+--------+ ECA       47      6                                               +----------+--------+--------+--------+-----------------------+--------+ +----------+--------+--------+--------+-------------------+           PSV cm/sEDV cm/sDescribeArm Pressure (mmHG) +----------+--------+--------+--------+-------------------+ [id number]                                          +----------+--------+--------+--------+-------------------+ +---------+--------+--+--------+--+---------+ VertebralPSV cm/s58EDV cm/s15Antegrade +---------+--------+--+--------+--+---------+   Summary: Right Carotid: Velocities in the right ICA are consistent with a 1-39% stenosis. Left Carotid: Velocities in the left ICA are consistent with a 1-39% stenosis. Vertebrals:  Bilateral vertebral arteries demonstrate antegrade flow. *See table(s) above for measurements and observations.     Preliminary     Impression/ -Upper GI bleeding, as evidenced by melena and hematemesis, etiology unknown.  Patient's last EGD was in 2004 and showed possible esophageal ulcer above the GE junction. -Worsening anemia: Hgb of 8.2 this morning, decreased from 9.0 yesterday and 10.1 on 4/5.  -Worsening renal function:  Today, BUN 77/Cr 1.84 increased from BUN 53/Cr 1.66.    Plan: -Patient is currently NPO and has been NPO since midnight per RN Clifton James.  -We will proceed with EGD today. I called patient's daughter, Benjie Karvonen, and discussed patient's symptoms.  Thoroughly discussed EGD, including benefits and risks (bleeding, perforation).  Patient's daughter was given the opportunity to ask questions and gave consent to proceed with the procedure today.  RN Clifton James confirmed consent.  -Continue IV Protonix.  -Continue supportive care with IVF and PRN antiemetics.  -Continue monitoring H&H and transfuse as needed.  -Do not initiate anticoagulation at this time.   LOS: 9 days   Salley Slaughter  09/08/2019, 10:48 AM  Questions please call [phone]

## 2019-09-04 NOTE — Anesthesia Postprocedure Evaluation (Signed)
Anesthesia Post Note  Patient: Natalie Rodgers  Procedure(s) Performed: ESOPHAGOGASTRODUODENOSCOPY (EGD) WITH PROPOFOL (N/A ) HOT HEMOSTASIS (ARGON PLASMA COAGULATION/BICAP) (N/A ) SUBMUCOSAL INJECTION BIOPSY     Patient location during evaluation: PACU Anesthesia Type: MAC Level of consciousness: awake and alert Pain management: pain level controlled Vital Signs Assessment: post-procedure vital signs reviewed and stable Respiratory status: spontaneous breathing and respiratory function stable Cardiovascular status: stable Postop Assessment: no apparent nausea or vomiting Anesthetic complications: no    Last Vitals:  Vitals:   08/29/2019 1440 09/13/2019 1455  BP: (!) 102/37   Pulse: 91   Resp: (!) 22   Temp:    SpO2: 96% 94%    Last Pain:  Vitals:   09/10/2019 1410  TempSrc: Oral  PainSc:                  Shuntia Exton DANIEL

## 2019-09-04 NOTE — Progress Notes (Signed)
PROGRESS NOTE    Natalie Rodgers  KAJ:[id number] DOB: [birth year] DOA: 08/05/2019 PCP: Welford Roche, NP   Brief Narrative:  Natalie Rodgers is a 79 y.o. femalewith PMH significant for HTN, DM2 not on insulin, CKD, dementia, chronic anemia. Patient presented secondary to right leg pain. She resides at [facility]. She was found to be febrile on admission with associated evidence concerning for SIRS with unknown source likely this was secondary to a right foot cellulitis and myositis. She was started on empiric antibiotics and has now been transitioned to p.o. antibiotics. She also developed atrial fibrillation with RVR and started on Diltiazem IV.  Cardiology was consulted for further evaluation recommendations and recommending anticoagulation however there is question of anticoagulant the patient given her history of hemorrhagic CVA.  Incidentally she was worked up with a brain MRI at the recommendations of neurology and she is found to have an incidental CVA so neurology was formally consulted and will still try and obtain records about the patient's history of her intracranial hemorrhage.  We werer Anticipating discharging to SNF next 24 to 48 hours once the issue of anticoagulation is been resolved and carotid Dopplers are obtained.  We will need to find out the etiology of intracranial hemorrhage to safely anticoagulate this patient, however currently anticoagulation is not an issue as she ended up having acute GI bleed and had some coffee-ground emesis.  GI was consulted and she was placed on a Protonix drip and she was found to have a large duodenal ulcer.  Assessment & Plan:   Principal Problem:   SIRS (systemic inflammatory response syndrome) (HCC) Active Problems:   Atrial fibrillation with RVR (HCC)   AKI (acute kidney injury) ([place])   Type 2 diabetes mellitus with hyperlipidemia (HCC)   Essential hypertension   Dementia without behavioral disturbance (HCC)   Bilateral foot  pain   SIRS present on Admission -Source of bilateral foot cellulitis and myositis. Present on admission.  -Does not meet sepsis criteria. SIRS criteria met on admission.  -Urine culture with no growth (final); blood cultures with no growth to date.  -COVID-19 and influenza negative.  -Chest x-ray without acute abnormalities. Left foot x-ray with possible evidence of infection. -Empirically started on Vancomycin and Cefepime. Now on Cefdinir and will continue.  -Blood cultures no growth to date.  Paroxysmal Atrial Fibrillation with RVR -Initially started on Diltiazem drip which is now discontinued.  -Currently in sinus rhythm. On Coreg and aspirin as an outpatient. CHA2DS2-VASc Scoreis 7.  -Patient with a history of intracranial hemorrhage.  -Patient reentered RVR on 4/3 secondary to discontinuation of Coreg and is now back in sinus rhythm -Continue Coreg -Cardiology recommendations for management and anticoagulation recommendations in setting of history of hemorrhagic stroke -Consulted neurology for further recommendations given her anticoagulation and neurology feels that she should not be anticoagulated if she had a spontaneous hemorrhagic stroke or if she had a stroke secondary to amyloid -Need further evaluation and obtaining records.  Neurology consulted formally and they are recommending considering Eliquis 2.5 mg p.o. twice daily due to her renal dysfunction age and given that prior ICH is not an absolute contraindication however we will need to obtain records to understand the etiology of her [place] -Patient is on aspirin currently per cardiology however this has been stopped given that she had an acute GI bleed  Anaphylactic Shock -Unknown etiology. Possibly related to barium vs food ingestion.  -Patient had stridor and hypotension requiring racemic epinephrine, solu-medrol, Pepcid, benadryl.  -Patient required  norepinephrine for blood pressure support which is now discontinued.  Barium added as allergy to patient's chart.  -Resolved.  Acute GI bleeding with Hematemesis in the setting of a Large Duodenal Ulcer Acute Blood Loss Anemia -Had a very large coffee-ground emesis this a.m. -Was made n.p.o. and her aspirin was discontinued -Patient's hemoglobin/hematocrit has been relatively stable has now been trending down and went from 10.7/34.5 is now trended down to 7.8/25.0 -Continue to monitor for signs and symptoms of further bleeding -Gastroenterology was consulted and prior to the consult had placed the patient on a Protonix drip -Continue monitor H&H's every 6 hours -The EGD was done and showed a mild to distal LA grade D esophagitis as well as a large duodenal sweep ulcer with visible vessel and blood clots that was treated with epinephrine injection and gold probe cautery and successful hemostasis was achieved -GI recommending n.p.o. for now on-clears after 4 hours and continue Protonix drip -Dr. Alessandra Bevels recommending if the patient continues to have ongoing bleeding considering repeat endoscopy versus interventional radiology consult for embolization of gastroduodenal artery -Repeat CBC in the a.m. and continue with Protonix drip and a clear liquid diet for now -After my discussion with Dr. Alessandra Bevels we will hold all in any anticoagulation for least 48 hours and stop her aspirin and reassess if we can obtain records for this patient and find out what the etiology of her intracranial hemorrhage was.  Acute/subacute left cerebellar infarct  -Incidental finding on her MRI -MRI showed "Small, likely late acute infarct of the left cerebellum. Moderate chronic microvascular ischemic changes. 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 No evidence of recent hemorrhage. Minor chronic blood products are Noted." -As below -Lipid panel as below as well as hemoglobin A1c -PT OT recommending skilled nursing facility and SLP  recommending dysphagia 3 diet with thin liquids -Neurology consulted and recommending obtaining carotid Dopplers which have been ordered -Likely the patient needs anticoagulation for her atrial fibrillation RVR and likely the stroke could have been secondary to embolic event from A. fib but will need prior records to determine the etiology of her prior intracranial hemorrhage -I am unable to locate any records from this patient and will need to discuss with the daughter further in detail.  I have called [facility] neurology as well as [facility] [facility] and they have no records of this patient being seen in the outpatient setting at all -Carotid Ultrasounds were done and showed that the Velocities in the right  And left ICA are consistent with a 1-39% stenosis. Vertebrals: Bilateral vertebral arteries demonstrate antegrade flow.   Foot Cellulitis -MRI significant for cellulitis with mild myositis of both feet. No external evidence noted. Improved but still with some pain. -Continue Tylenol prn -Cefdinir (renally dosed). Will treat for 10-14 days based on symptoms -Voltaren gel for pain -PT/OT recommendations: SNF and will be discharged when she is medically stable  Leukocytosis -Secondary to infection initially and trended down. Now elevated in setting of steroid use.  And was 13.0 -Doubt related to infection but could be in the setting of her acute GI bleeding and severe demargination. -Continue to Monitor Carefully   Mild right-sided rales -Possible aspiration.  -Clear chest x-ray on admission.  -Repeat chest x-ray without evidence of infiltrate.  -Resolved.  CKD stage IV -Creatinine of 2.02 on admission.  -Baseline creatinine of 2.2 per chart review on linked chart and review of outside records. -Patient does not have acute kidney injury  but stable CKD.  -Creatinine is below baseline but is slowly trending back up and had an elevated BUN in the setting of her acute GI  bleeding -BUN/creatinine has now gone from 42/1.38 -> 53/1.66 -> 77/1.84  Diabetes Mellitus, Type 2 -Hemoglobin A1C of 7%. Patient is on glipizide as an out patient. -Continue SSI -BG's have been ranging from 204-296  Essential Hypertension -Uncontrolled in setting of holding antihypertensives from shock. -Continue Coreg, BiDil and amlodipine, benazepril and chlorthalidone -Holding clonidine with continued control of blood pressure. Recommend discontinuing clonidine on discharge if BP remains stable -Blood pressures have been on the lower side and last blood pressure was 110/56 -Continue to Monitor BP per Protocol   Hyperlipidemia -Lipid panel showed cholesterol/HDL ratio 3.1, cholesterol level 107, HDL 35, LDL of 143, triglycerides 144, VLDL 29 -Continue ezetimibe 10 mg daily, pravastatin nightly, fenofibrate 160 mg p.o. daily  Dementia -Continue Donepezil 10 mg p.o. nightly  Abdominal distension -Improved. Patient with multiple bowel movements.  Dysphagia -SLP consulted and barium swallow performed 4/1. Heart healthy/carb modified diet started 4/6 -SLP reevaluated today diet recommendations to dysphagia 3 diet with thin liquids  History of intracranial hemorrhage in the past with right-sided weakness -We will need to determine the etiology given that she has a small punctate infarct in the left cerebellum cannot find any records on this patient and have called [facility] as well as [facility] with no LOC -We will need to discuss with the patient  DVT prophylaxis: SCDs, Code Status: FULL CODE  Family Communication: Discussed with Daughter over the Phone Disposition Plan: D/C to SNF when able to find out if she can be anticoagulated from a neurological perspective  Consultants:   Cardiology  Neurology   Procedures:  ECHOCARDIOGRAM 08/30/19 IMPRESSIONS    1. Hyperdynamic LV systolic function; grade 1 diastolic dysfunction;  elevated mean gradient  across aortic valve likely related to vigorous LV  function as visually aortic valve opens well.  2. Left ventricular ejection fraction, by estimation, is >75%. The left  ventricle has hyperdynamic function. The left ventricle has no regional  wall motion abnormalities. Left ventricular diastolic parameters are  consistent with Grade I diastolic  dysfunction (impaired relaxation).  3. Right ventricular systolic function is normal. The right ventricular  size is normal.  4. The mitral valve is normal in structure. No evidence of mitral valve  regurgitation. No evidence of mitral stenosis.  5. The aortic valve is tricuspid. Aortic valve regurgitation is not  visualized. Mild to moderate aortic valve sclerosis/calcification is  present, without any evidence of aortic stenosis.  6. The inferior vena cava is normal in size with greater than 50%  respiratory variability, suggesting right atrial pressure of 3 mmHg.   FINDINGS  Left Ventricle: Left ventricular ejection fraction, by estimation, is  >75%. The left ventricle has hyperdynamic function. The left ventricle has  no regional wall motion abnormalities. The left ventricular internal  cavity size was normal in size. There  is no left ventricular hypertrophy. Left ventricular diastolic parameters  are consistent with Grade I diastolic dysfunction (impaired relaxation).   Right Ventricle: The right ventricular size is normal.Right ventricular  systolic function is normal.   Left Atrium: Left atrial size was normal in size.   Right Atrium: Right atrial size was normal in size.   Pericardium: There is no evidence of pericardial effusion.   Mitral Valve: The mitral valve is normal in structure. Normal mobility of  the mitral valve leaflets. Mild mitral  annular calcification. No evidence  of mitral valve regurgitation. No evidence of mitral valve stenosis.   Tricuspid Valve: The tricuspid valve is normal in structure. Tricuspid   valve regurgitation is trivial. No evidence of tricuspid stenosis.   Aortic Valve: The aortic valve is tricuspid. Aortic valve regurgitation is  not visualized. Mild to moderate aortic valve sclerosis/calcification is  present, without any evidence of aortic stenosis. Aortic valve mean  gradient measures 12.0 mmHg. Aortic  valve peak gradient measures 24.4 mmHg. Aortic valve area, by VTI measures  1.41 cm.   Pulmonic Valve: The pulmonic valve was not well visualized. Pulmonic valve  regurgitation is not visualized. No evidence of pulmonic stenosis.   Aorta: The aortic root is normal in size and structure.   Venous: The inferior vena cava is normal in size with greater than 50%  respiratory variability, suggesting right atrial pressure of 3 mmHg.    Additional Comments: Hyperdynamic LV systolic function; grade 1 diastolic  dysfunction; elevated mean gradient across aortic valve likely related to  vigorous LV function as visually aortic valve opens well.     LEFT VENTRICLE  PLAX 2D  LVIDd:     3.60 cm  LVIDs:     2.60 cm  LV PW:     1.10 cm  LV IVS:    1.00 cm  LVOT diam:   2.00 cm  LV SV:     58  LV SV Index:  35  LVOT Area:   3.14 cm     RIGHT VENTRICLE       IVC  RV Basal diam: 2.30 cm   IVC diam: 1.70 cm  RV S prime:   16.60 cm/s  TAPSE (M-mode): 1.7 cm   LEFT ATRIUM       Index    RIGHT ATRIUM      Index  LA diam:    3.30 cm 2.00 cm/m RA Area:   12.80 cm  LA Vol (A2C):  17.9 ml 10.85 ml/m RA Volume:  29.90 ml 18.13 ml/m  LA Vol (A4C):  19.9 ml 12.07 ml/m  LA Biplane Vol: 20.8 ml 12.61 ml/m  AORTIC VALVE  AV Area (Vmax):  1.30 cm  AV Area (Vmean):  1.36 cm  AV Area (VTI):   1.41 cm  AV Vmax:      247.00 cm/s  AV Vmean:     155.500 cm/s  AV VTI:      0.416 m  AV Peak Grad:   24.4 mmHg  AV Mean Grad:   12.0 mmHg  LVOT Vmax:     101.95 cm/s  LVOT Vmean:     67.350 cm/s  LVOT VTI:     0.186 m  LVOT/AV VTI ratio: 0.45    AORTA  Ao Root diam: 3.60 cm  Ao Asc diam: 3.40 cm   MV A velocity: 128.00 cm/s               SHUNTS               Systemic VTI: 0.19 m               Systemic Diam: 2.00 cm   EGD 08/31/2019 Findings:      LA Grade D (one or more mucosal breaks involving at least 75% of       esophageal circumference) esophagitis with bleeding was found in the       lower third of the esophagus.      Hematin (altered blood/coffee-ground-like material)  was found in the       gastric fundus. Biopsies were taken with a cold forceps for histology.      There is no endoscopic evidence of bleeding in the entire examined       stomach.      One non-obstructing non-bleeding cratered duodenal ulcer with a visible       vessel and blood clot was found in the first portion of the duodenum.       The lesion was 15 mm in largest dimension. Area was successfully       injected with 3 mL of a 1:10,000 solution of epinephrine for hemostasis.       After that, blood clots were removed. Fulguration to ablate the lesion       to prevent bleeding by Rolanda Lundborg was successful.      Few non-bleeding superficial duodenal ulcers were found in the duodenal       bulb. Impression:               - LA Grade D esophagitis with bleeding.                           - Hematin (altered blood/coffee-ground-like                            material) in the gastric fundus. Biopsied.                           - Non-obstructing non-bleeding duodenal ulcer with                            a visible vessel. Injected. SKIP.                           - Non-bleeding duodenal ulcers.  Antimicrobials:  Anti-infectives (From admission, onward)   Start     Dose/Rate Route Frequency Ordered Stop   09/01/19 1600  cefdinir (OMNICEF) capsule 300 mg     300 mg Oral Daily 09/01/19 1352     08/27/19 1500  ceFEPIme (MAXIPIME) 2 g in  sodium chloride 0.9 % 100 mL IVPB  Status:  Discontinued     2 g 200 mL/hr over 30 Minutes Intravenous Every 24 hours 08/16/2019 1706 09/01/19 1352   08/16/2019 1345  vancomycin (VANCOCIN) IVPB 1000 mg/200 mL premix     1,000 mg 200 mL/hr over 60 Minutes Intravenous  Once 08/11/2019 1339 08/02/2019 1603   08/18/2019 1345  ceFEPIme (MAXIPIME) 2 g in sodium chloride 0.9 % 100 mL IVPB     2 g 200 mL/hr over 30 Minutes Intravenous  Once 08/22/2019 1339 08/02/2019 1528     Subjective: Seen and examined at bedside and she is not feeling well after she had just vomited coffee-ground emesis.  She is wanting something to drink.  No chest pain but did have some abdominal discomfort.  No other concerns or plans at this time.  Objective: Vitals:   09/08/2019 1425 09/21/2019 1440 09/18/2019 1455 09/08/2019 1514  BP: (!) 100/28 (!) 102/37 118/65 (!) 107/58  Pulse: 84 91  92  Resp: (!) 26 (!) 22 (!) 22 (!) 22  Temp:    99.6 F (37.6 C)  TempSrc:    Oral  SpO2: 100% 96% 94% 92%  Weight:      Height:        Intake/Output Summary (Last 24 hours) at 09/26/2019 1907 Last data filed at 09/23/2019 1800 Gross per 24 hour  Intake 504.47 ml  Output 0 ml  Net 504.47 ml   Filed Weights   08/04/2019 1537 08/27/19 0135 09/13/2019 1247  Weight: 83.9 kg 67.8 kg 67.8 kg   Examination: Physical Exam:  Constitutional: WN/WD overweight female currently in mild distress after she had just vomited some blood and remains a little confused Eyes: Lids and conjunctivae normal, sclerae anicteric  ENMT: External Ears, Nose appear normal. Grossly normal hearing.  Neck: Appears normal, supple, no cervical masses, normal ROM, no appreciable thyromegaly; no JVD Respiratory: Diminished to auscultation bilaterally, no wheezing, rales, rhonchi or crackles. Normal respiratory effort and patient is not tachypenic. No accessory muscle use.  Cardiovascular: RRR, no murmurs / rubs / gallops. S1 and S2 auscultated.  Mild lower extremity edema Abdomen:  Soft, tender to palpate, distended secondary habitus. Bowel sounds positive.  GU: Deferred. Musculoskeletal: No clubbing / cyanosis of digits/nails. No joint deformity upper and lower extremities.   Skin: No rashes, lesions, ulcers on limited skin evaluation. No induration; Warm and dry.  Neurologic: CN 2-12 grossly intact with no focal deficits. Romberg sign and cerebellar reflexes not assessed.  Psychiatric: Impaired judgment and insight.  She is awake and alert.  Somewhat confused mood and appropriate affect.   Data Reviewed: I have personally reviewed following labs and imaging studies  CBC: Recent Labs  Lab 08/29/19 0301 08/29/19 0301 08/30/19 0300 09/01/19 0534 09/03/19 0555 09/23/2019 0603 09/13/2019 1201  WBC 11.9*  --  16.6* 12.5* 12.6* 13.0*  --   NEUTROABS  --   --   --   --   --  10.0*  --   HGB 10.1*   < > 10.0* 10.1* 9.0* 8.2* 7.8*  HCT 32.2*   < > 31.6* 31.9* 28.8* 26.3* 25.0*  MCV 96.7  --  95.2 94.9 96.0 94.9  --   PLT 230  --  301 301 322 399  --    < > = values in this interval not displayed.   Basic Metabolic Panel: Recent Labs  Lab 08/29/19 0301 08/30/19 0300 08/30/19 1818 09/02/19 0518 09/03/19 0555 09/09/2019 0603  NA 140 142  --  143 144 141  K 3.9 3.8  --  3.8 3.8 4.2  CL 113* 112*  --  109 109 101  CO2 18* 20*  --  '25 24 29  ' GLUCOSE 322* 258*  --  188* 187* 226*  BUN 40* 51*  --  42* 53* 77*  CREATININE 1.97* 1.87*  --  1.38* 1.66* 1.84*  CALCIUM 7.9* 8.5*  --  8.7* 8.8* 8.8*  MG  --   --  2.0  --   --  1.9  PHOS  --   --   --   --   --  2.4*   GFR: Estimated Creatinine Clearance: 20.9 mL/min (A) (by C-G formula based on SCr of 1.84 mg/dL (H)). Liver Function Tests: Recent Labs  Lab 09/18/2019 0603  AST 20  ALT 31  ALKPHOS 61  BILITOT 0.6  PROT 5.7*  ALBUMIN 2.7*   No results for input(s): LIPASE, AMYLASE in the last 168 hours. No results for input(s): AMMONIA in the last 168 hours. Coagulation Profile: No results for input(s): INR,  PROTIME in the last 168 hours. Cardiac Enzymes: No results for input(s): CKTOTAL, CKMB, CKMBINDEX, TROPONINI  in the last 168 hours. BNP (last 3 results) No results for input(s): PROBNP in the last 8760 hours. HbA1C: No results for input(s): HGBA1C in the last 72 hours. CBG: Recent Labs  Lab 09/03/19 2151 09/10/2019 0732 09/11/2019 1155 08/31/2019 1307 09/01/2019 1641  GLUCAP 205* 225* 280* 296* 204*   Lipid Profile: Recent Labs    09/03/19 0555  CHOL 107  HDL 35*  LDLCALC 43  TRIG 144  CHOLHDL 3.1   Thyroid Function Tests: No results for input(s): TSH, T4TOTAL, FREET4, T3FREE, THYROIDAB in the last 72 hours. Anemia Panel: No results for input(s): VITAMINB12, FOLATE, FERRITIN, TIBC, IRON, RETICCTPCT in the last 72 hours. Sepsis Labs: No results for input(s): PROCALCITON, LATICACIDVEN in the last 168 hours.  Recent Results (from the past 240 hour(s))  Blood culture (routine x 2)     Status: None   Collection Time: 08/10/2019  2:05 PM   Specimen: BLOOD  Result Value Ref Range Status   Specimen Description   Final    BLOOD LEFT ANTECUBITAL Performed at [facility] [address], [place], [place] [address]    Special Requests   Final    BOTTLES DRAWN AEROBIC AND ANAEROBIC Blood Culture adequate volume Performed at [facility] [address], [place], [place] [address]    Culture   Final    NO GROWTH 5 DAYS Performed at [facility], [place] [address], [place], [place] [address]    Report Status 08/31/2019 FINAL  Final  Respiratory Panel by RT PCR (Flu A&B, Covid) - Nasopharyngeal Swab     Status: None   Collection Time: 08/23/2019  2:18 PM   Specimen: Nasopharyngeal Swab  Result Value Ref Range Status   SARS Coronavirus 2 by RT PCR NEGATIVE NEGATIVE Final    Comment: (NOTE) SARS-CoV-2 target nucleic acids are NOT DETECTED. The SARS-CoV-2 RNA is generally detectable in upper respiratoy specimens during the acute phase of  infection. The lowest concentration of SARS-CoV-2 viral copies this assay can detect is 131 copies/mL. A negative result does not preclude SARS-Cov-2 infection and should not be used as the sole basis for treatment or other patient management decisions. A negative result may occur with  improper specimen collection/handling, submission of specimen other than nasopharyngeal swab, presence of viral mutation(s) within the areas targeted by this assay, and inadequate number of viral copies (<131 copies/mL). A negative result must be combined with clinical observations, patient history, and epidemiological information. The expected result is Negative. Fact Sheet for Patients:  PinkCheek.be Fact Sheet for Healthcare Providers:  GravelBags.it This test is not yet ap proved or cleared by the [place] FDA and  has been authorized for detection and/or diagnosis of SARS-CoV-2 by FDA under an Emergency Use Authorization (EUA). This EUA will remain  in effect (meaning this test can be used) for the duration of the COVID-19 declaration under Section 564(b)(1) of the Act, 21 U.S.C. section 360bbb-3(b)(1), unless the authorization is terminated or revoked sooner.    Influenza A by PCR NEGATIVE NEGATIVE Final   Influenza B by PCR NEGATIVE NEGATIVE Final    Comment: (NOTE) The Xpert Xpress SARS-CoV-2/FLU/RSV assay is intended as an aid in  the diagnosis of influenza from Nasopharyngeal swab specimens and  should not be used as a sole basis for treatment. Nasal washings and  aspirates are unacceptable for Xpert Xpress SARS-CoV-2/FLU/RSV  testing. Fact Sheet for Patients: PinkCheek.be Fact Sheet for Healthcare Providers: GravelBags.it This test is not yet approved  or cleared by the [place] and  has been authorized for detection and/or diagnosis of SARS-CoV-2 by  FDA under  an Emergency Use Authorization (EUA). This EUA will remain  in effect (meaning this test can be used) for the duration of the  Covid-19 declaration under Section 564(b)(1) of the Act, 21  U.S.C. section 360bbb-3(b)(1), unless the authorization is  terminated or revoked. Performed at [facility], [place] [address] [place], [place] [address]   Blood culture (routine x 2)     Status: None   Collection Time: 08/22/2019  2:37 PM   Specimen: BLOOD  Result Value Ref Range Status   Specimen Description   Final    BLOOD RIGHT WRIST Performed at [facility] [address] [place], [place] [address]    Special Requests   Final    BOTTLES DRAWN AEROBIC ONLY Blood Culture results may not be optimal due to an inadequate volume of blood received in culture bottles Performed at [facility] [address], [place], [place] [address]    Culture   Final    NO GROWTH 5 DAYS Performed at [facility], [place] [address], [place], [place] [address]    Report Status 08/31/2019 FINAL  Final  Urine culture     Status: None   Collection Time: 07/31/2019  3:19 PM   Specimen: Urine, Catheterized  Result Value Ref Range Status   Specimen Description   Final    URINE, CATHETERIZED Performed at [facility] [address], [place], [place] [address]    Special Requests   Final    NONE Performed at [facility], [place] [address], [place], [place] [address]    Culture   Final    NO GROWTH Performed at [facility], [place] [address] [place], [place] [address]    Report Status 08/27/2019 FINAL  Final  MRSA PCR Screening     Status: None   Collection Time: 08/27/19  1:46 AM   Specimen: Nasal Mucosa; Nasopharyngeal  Result Value Ref Range Status   MRSA by PCR NEGATIVE NEGATIVE Final    Comment:        The GeneXpert MRSA Assay (FDA approved for NASAL specimens only), is one component of a comprehensive  MRSA colonization surveillance program. It is not intended to diagnose MRSA infection nor to guide or monitor treatment for MRSA infections. Performed at [facility], [place] [address], [place], [place] [address]   Respiratory Panel by RT PCR (Flu A&B, Covid) - Nasopharyngeal Swab     Status: None   Collection Time: 09/03/19 12:28 PM   Specimen: Nasopharyngeal Swab  Result Value Ref Range Status   SARS Coronavirus 2 by RT PCR NEGATIVE NEGATIVE Final    Comment: (NOTE) SARS-CoV-2 target nucleic acids are NOT DETECTED. The SARS-CoV-2 RNA is generally detectable in upper respiratoy specimens during the acute phase of infection. The lowest concentration of SARS-CoV-2 viral copies this assay can detect is 131 copies/mL. A negative result does not preclude SARS-Cov-2 infection and should not be used as the sole basis for treatment or other patient management decisions. A negative result may occur with  improper specimen collection/handling, submission of specimen other than nasopharyngeal swab, presence of viral mutation(s) within the areas targeted by this assay, and inadequate number of viral copies (<131 copies/mL). A negative result must be combined with clinical observations, patient history, and epidemiological information. The expected result is  Negative. Fact Sheet for Patients:  PinkCheek.be Fact Sheet for Healthcare Providers:  GravelBags.it This test is not yet ap proved or cleared by the [place] FDA and  has been authorized for detection and/or diagnosis of SARS-CoV-2 by FDA under an Emergency Use Authorization (EUA). This EUA will remain  in effect (meaning this test can be used) for the duration of the COVID-19 declaration under Section 564(b)(1) of the Act, 21 U.S.C. section 360bbb-3(b)(1), unless the authorization is terminated or revoked sooner.    Influenza A by PCR NEGATIVE NEGATIVE  Final   Influenza B by PCR NEGATIVE NEGATIVE Final    Comment: (NOTE) The Xpert Xpress SARS-CoV-2/FLU/RSV assay is intended as an aid in  the diagnosis of influenza from Nasopharyngeal swab specimens and  should not be used as a sole basis for treatment. Nasal washings and  aspirates are unacceptable for Xpert Xpress SARS-CoV-2/FLU/RSV  testing. Fact Sheet for Patients: PinkCheek.be Fact Sheet for Healthcare Providers: GravelBags.it This test is not yet approved or cleared by the [place] FDA and  has been authorized for detection and/or diagnosis of SARS-CoV-2 by  FDA under an Emergency Use Authorization (EUA). This EUA will remain  in effect (meaning this test can be used) for the duration of the  Covid-19 declaration under Section 564(b)(1) of the Act, 21  U.S.C. section 360bbb-3(b)(1), unless the authorization is  terminated or revoked. Performed at [facility], [place] [address] [place], [place] [address]     RN Pressure Injury Documentation:     Estimated body mass index is 29.19 kg/m as calculated from the following:   Height as of this encounter: 5' (1.524 m).   Weight as of this encounter: 67.8 kg.  Malnutrition Type:      Malnutrition Characteristics:      Nutrition Interventions:    Radiology Studies: MR BRAIN WO CONTRAST  Result Date: 09/03/2019 CLINICAL DATA:  Anticoagulation for atrial fibrillation, history of intracranial hemorrhage EXAM: MRI HEAD WITHOUT CONTRAST TECHNIQUE: Multiplanar, multiecho pulse sequences of the brain and surrounding structures were obtained without intravenous contrast. COMPARISON:  None. FINDINGS: Brain: There is a punctate focus of mildly reduced diffusion within the anterior inferior left cerebellum. Patchy and confluent areas of T2 hyperintensity in the supratentorial white matter are nonspecific may reflect moderate chronic microvascular ischemic  changes. There are chronic small vessel infarcts of the basal ganglia and adjacent white matter bilaterally as well as the thalamus bilaterally. Possible chronic blood products associated with right thalamic infarct. There is a small chronic cortical infarct of the inferior left temporal lobe. Small focus of susceptibility the left temporal lobe also likely reflects chronic microhemorrhage. Prominence of the ventricles and sulci reflects generalized parenchymal volume loss. There is no intracranial mass, mass effect, or edema. There is no hydrocephalus or extra-axial fluid collection. Vascular: Major vessel flow voids at the skull base are preserved. Skull and upper cervical spine: Normal marrow signal is preserved. Sinuses/Orbits: Paranasal sinuses are aerated. Orbits are unremarkable. Other: Sella is unremarkable. Mild patchy mastoid fluid opacification. 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 Moderate chronic microvascular ischemic changes. 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 No evidence of recent hemorrhage. Minor chronic blood products are noted. Electronically Signed   By: Macy Mis M.D.   On: 09/03/2019 07:17   VAS US CAROTID  Result Date: 09/07/2019 Carotid Arterial Duplex Study Indications:       CVA. Risk Factors:  None. Limitations        Today's exam was limited due to the body habitus of the                    patient, the patient's inability or unwillingness to                    cooperate and patient positioning. Comparison Study:  No prior studies. Performing Technologist: Oliver Hum RVT  Examination Guidelines: A complete evaluation includes B-mode imaging, spectral Doppler, color Doppler, and power Doppler as needed of all accessible portions of each vessel. Bilateral testing is considered an integral part of a complete examination. Limited examinations for reoccurring indications may be performed  as noted.  Right Carotid Findings: +----------+--------+--------+--------+-----------------------+--------+           PSV cm/sEDV cm/sStenosisPlaque Description     Comments +----------+--------+--------+--------+-----------------------+--------+ CCA Prox  49      15              smooth and heterogenous         +----------+--------+--------+--------+-----------------------+--------+ CCA Distal48      14              smooth and heterogenous         +----------+--------+--------+--------+-----------------------+--------+ ICA Prox  48      19              smooth and heterogenous         +----------+--------+--------+--------+-----------------------+--------+ ICA Distal62      23                                     tortuous +----------+--------+--------+--------+-----------------------+--------+ ECA       74      10                                              +----------+--------+--------+--------+-----------------------+--------+ +----------+--------+-------+--------+-------------------+           PSV cm/sEDV cmsDescribeArm Pressure (mmHG) +----------+--------+-------+--------+-------------------+ [id number]                                         +----------+--------+-------+--------+-------------------+ +---------+--------+--+--------+--+---------+ VertebralPSV cm/s47EDV cm/s12Antegrade +---------+--------+--+--------+--+---------+  Left Carotid Findings: +----------+--------+--------+--------+-----------------------+--------+           PSV cm/sEDV cm/sStenosisPlaque Description     Comments +----------+--------+--------+--------+-----------------------+--------+ CCA Prox  59      13              smooth and heterogenous         +----------+--------+--------+--------+-----------------------+--------+ CCA Distal40      7               smooth and heterogenous          +----------+--------+--------+--------+-----------------------+--------+ ICA Prox  39      13              smooth and heterogenous         +----------+--------+--------+--------+-----------------------+--------+ ICA Distal43      18                                     tortuous +----------+--------+--------+--------+-----------------------+--------+  ECA       47      6                                               +----------+--------+--------+--------+-----------------------+--------+ +----------+--------+--------+--------+-------------------+           PSV cm/sEDV cm/sDescribeArm Pressure (mmHG) +----------+--------+--------+--------+-------------------+ [id number]                                          +----------+--------+--------+--------+-------------------+ +---------+--------+--+--------+--+---------+ VertebralPSV cm/s58EDV cm/s15Antegrade +---------+--------+--+--------+--+---------+   Summary: Right Carotid: Velocities in the right ICA are consistent with a 1-39% stenosis. Left Carotid: Velocities in the left ICA are consistent with a 1-39% stenosis. Vertebrals: Bilateral vertebral arteries demonstrate antegrade flow. *See table(s) above for measurements and observations.  Electronically signed by Curt Jews MD on 09/02/2019 at 3:15:53 PM.    Final    Scheduled Meds: . carvedilol  25 mg Oral BID WC  . cefdinir  300 mg Oral Daily  . Chlorhexidine Gluconate Cloth  6 each Topical Daily  . diclofenac Sodium  4 g Topical QID  . [START ON 09-15-2019] diltiazem  240 mg Oral Daily  . donepezil  10 mg Oral QHS  . ezetimibe  10 mg Oral Daily  . fenofibrate  160 mg Oral Daily  . insulin aspart  0-5 Units Subcutaneous QHS  . insulin aspart  0-9 Units Subcutaneous TID WC  . isosorbide-hydrALAZINE  1 tablet Oral TID  . melatonin  2 mg Oral QHS  . [START ON 09/07/2019] pantoprazole  40 mg Intravenous Q12H  . phosphorus  500 mg Oral Once  . pravastatin  80 mg Oral QHS   . QUEtiapine  25 mg Oral Daily  . QUEtiapine  50 mg Oral QHS  . senna  1 tablet Oral BID  . sodium chloride flush  3 mL Intravenous Q12H   Continuous Infusions: . sodium chloride Stopped (08/30/19 0911)  . pantoprozole (PROTONIX) infusion 8 mg/hr (09/15/2019 1631)    LOS: 9 days   Kerney Elbe, DO [facility] PAGER is on [place]  If 7PM-7AM, please contact night-coverage [email or web address]

## 2019-09-04 NOTE — Progress Notes (Signed)
Per patient's daughter, patient understands [place] and can speak [place], but primary language is Youth worker.  In endoscopy unit, patient was more alert.  Video interpreter Santiago Glad [phone]) was utilized.   Patient denies abdominal pain, but mentioned multiple times that she is thirsty.  When asked if she has ever had bleeding like this before, she responded "I don't know."   With interpreter, Dr. Alessandra Bevels and I explained EGD, risks, and benefits to patient.  Patient amenable to proceeding with procedure, as discussed with patient's daughter (see consult note).

## 2019-09-04 NOTE — Progress Notes (Signed)
Carotid artery duplex has been completed. Preliminary results can be found in CV Proc through chart review.   09/16/2019 9:09 AM Carlos Levering RVT

## 2019-09-04 NOTE — Anesthesia Preprocedure Evaluation (Addendum)
Anesthesia Evaluation  Patient identified by MRN, date of birth, ID band Patient awake    Reviewed: Allergy & Precautions, NPO status , Patient's Chart, lab work & pertinent test results  History of Anesthesia Complications Negative for: history of anesthetic complications  Airway Mallampati: II  TM Distance: >3 FB Neck ROM: Full    Dental no notable dental hx.    Pulmonary neg pulmonary ROS,    Pulmonary exam normal breath sounds clear to auscultation       Cardiovascular hypertension, Normal cardiovascular exam+ dysrhythmias Atrial Fibrillation  Rhythm:Regular Rate:Normal     Neuro/Psych Dementia CVA negative psych ROS   GI/Hepatic negative GI ROS, Neg liver ROS,   Endo/Other  diabetes  Renal/GU Renal InsufficiencyRenal disease  negative genitourinary   Musculoskeletal negative musculoskeletal ROS (+)   Abdominal   Peds negative pediatric ROS (+)  Hematology negative hematology ROS (+) anemia ,   Anesthesia Other Findings   Reproductive/Obstetrics negative OB ROS                            Anesthesia Physical Anesthesia Plan  ASA: IV  Anesthesia Plan: MAC   Post-op Pain Management:    Induction: Intravenous  PONV Risk Score and Plan: 0  Airway Management Planned: Simple Face Mask  Additional Equipment:   Intra-op Plan:   Post-operative Plan:   Informed Consent: I have reviewed the patients History and Physical, chart, labs and discussed the procedure including the risks, benefits and alternatives for the proposed anesthesia with the patient or authorized representative who has indicated his/her understanding and acceptance.     Dental advisory given  Plan Discussed with: CRNA and Surgeon  Anesthesia Plan Comments:         Anesthesia Quick Evaluation

## 2019-09-05 ENCOUNTER — Other Ambulatory Visit: Payer: Self-pay

## 2019-09-05 DIAGNOSIS — R739 Hyperglycemia, unspecified: Secondary | ICD-10-CM | POA: Diagnosis present

## 2019-09-05 DIAGNOSIS — I679 Cerebrovascular disease, unspecified: Secondary | ICD-10-CM | POA: Diagnosis present

## 2019-09-05 DIAGNOSIS — A419 Sepsis, unspecified organism: Secondary | ICD-10-CM | POA: Diagnosis not present

## 2019-09-05 DIAGNOSIS — L03119 Cellulitis of unspecified part of limb: Secondary | ICD-10-CM | POA: Diagnosis present

## 2019-09-05 DIAGNOSIS — K922 Gastrointestinal hemorrhage, unspecified: Secondary | ICD-10-CM | POA: Diagnosis not present

## 2019-09-05 DIAGNOSIS — D62 Acute posthemorrhagic anemia: Secondary | ICD-10-CM | POA: Diagnosis not present

## 2019-09-05 DIAGNOSIS — I469 Cardiac arrest, cause unspecified: Secondary | ICD-10-CM | POA: Diagnosis not present

## 2019-09-05 DIAGNOSIS — E872 Acidosis, unspecified: Secondary | ICD-10-CM | POA: Diagnosis present

## 2019-09-05 DIAGNOSIS — Z593 Problems related to living in residential institution: Secondary | ICD-10-CM

## 2019-09-05 DIAGNOSIS — K264 Chronic or unspecified duodenal ulcer with hemorrhage: Secondary | ICD-10-CM | POA: Diagnosis not present

## 2019-09-05 DIAGNOSIS — I480 Paroxysmal atrial fibrillation: Secondary | ICD-10-CM | POA: Diagnosis present

## 2019-09-05 DIAGNOSIS — D638 Anemia in other chronic diseases classified elsewhere: Secondary | ICD-10-CM | POA: Diagnosis present

## 2019-09-05 DIAGNOSIS — I519 Heart disease, unspecified: Secondary | ICD-10-CM | POA: Diagnosis present

## 2019-09-05 DIAGNOSIS — R52 Pain, unspecified: Secondary | ICD-10-CM | POA: Diagnosis not present

## 2019-09-05 DIAGNOSIS — T17908A Unspecified foreign body in respiratory tract, part unspecified causing other injury, initial encounter: Secondary | ICD-10-CM | POA: Diagnosis not present

## 2019-09-05 DIAGNOSIS — I639 Cerebral infarction, unspecified: Secondary | ICD-10-CM | POA: Diagnosis present

## 2019-09-05 DIAGNOSIS — I700 Atherosclerosis of aorta: Secondary | ICD-10-CM | POA: Diagnosis present

## 2019-09-05 DIAGNOSIS — R634 Abnormal weight loss: Secondary | ICD-10-CM | POA: Diagnosis present

## 2019-09-05 DIAGNOSIS — N184 Chronic kidney disease, stage 4 (severe): Secondary | ICD-10-CM | POA: Diagnosis present

## 2019-09-05 DIAGNOSIS — G8191 Hemiplegia, unspecified affecting right dominant side: Secondary | ICD-10-CM

## 2019-09-05 DIAGNOSIS — R062 Wheezing: Secondary | ICD-10-CM | POA: Diagnosis present

## 2019-09-05 DIAGNOSIS — D72829 Elevated white blood cell count, unspecified: Secondary | ICD-10-CM | POA: Diagnosis present

## 2019-09-05 DIAGNOSIS — R9431 Abnormal electrocardiogram [ECG] [EKG]: Secondary | ICD-10-CM | POA: Diagnosis present

## 2019-09-05 DIAGNOSIS — I5189 Other ill-defined heart diseases: Secondary | ICD-10-CM | POA: Diagnosis present

## 2019-09-05 DIAGNOSIS — I6523 Occlusion and stenosis of bilateral carotid arteries: Secondary | ICD-10-CM | POA: Diagnosis present

## 2019-09-05 DIAGNOSIS — R1311 Dysphagia, oral phase: Secondary | ICD-10-CM | POA: Diagnosis present

## 2019-09-05 DIAGNOSIS — Z8673 Personal history of transient ischemic attack (TIA), and cerebral infarction without residual deficits: Secondary | ICD-10-CM

## 2019-09-05 DIAGNOSIS — I517 Cardiomegaly: Secondary | ICD-10-CM | POA: Diagnosis present

## 2019-09-05 DIAGNOSIS — T782XXA Anaphylactic shock, unspecified, initial encounter: Secondary | ICD-10-CM | POA: Diagnosis not present

## 2019-09-05 DIAGNOSIS — M609 Myositis, unspecified: Secondary | ICD-10-CM | POA: Diagnosis present

## 2019-09-05 DIAGNOSIS — E11628 Type 2 diabetes mellitus with other skin complications: Secondary | ICD-10-CM | POA: Diagnosis present

## 2019-09-05 DIAGNOSIS — R809 Proteinuria, unspecified: Secondary | ICD-10-CM | POA: Diagnosis present

## 2019-09-05 LAB — ABO/RH: ABO/RH(D): A POS

## 2019-09-05 LAB — CBC WITH DIFFERENTIAL/PLATELET
Abs Immature Granulocytes: 0.5 10*3/uL — ABNORMAL HIGH (ref 0.00–0.07)
Basophils Absolute: 0.1 10*3/uL (ref 0.0–0.1)
Basophils Relative: 0 %
Eosinophils Absolute: 0 10*3/uL (ref 0.0–0.5)
Eosinophils Relative: 0 %
HCT: 26.7 % — ABNORMAL LOW (ref 36.0–46.0)
Hemoglobin: 8.6 g/dL — ABNORMAL LOW (ref 12.0–15.0)
Immature Granulocytes: 3 %
Lymphocytes Relative: 9 %
Lymphs Abs: 1.6 10*3/uL (ref 0.7–4.0)
MCH: 30.2 pg (ref 26.0–34.0)
MCHC: 32.2 g/dL (ref 30.0–36.0)
MCV: 93.7 fL (ref 80.0–100.0)
Monocytes Absolute: 1.4 10*3/uL — ABNORMAL HIGH (ref 0.1–1.0)
Monocytes Relative: 8 %
Neutro Abs: 14.4 10*3/uL — ABNORMAL HIGH (ref 1.7–7.7)
Neutrophils Relative %: 80 %
Platelets: 342 10*3/uL (ref 150–400)
RBC: 2.85 MIL/uL — ABNORMAL LOW (ref 3.87–5.11)
RDW: 15.5 % (ref 11.5–15.5)
WBC: 18 10*3/uL — ABNORMAL HIGH (ref 4.0–10.5)
nRBC: 0.6 % — ABNORMAL HIGH (ref 0.0–0.2)

## 2019-09-05 LAB — COMPREHENSIVE METABOLIC PANEL
ALT: 27 U/L (ref 0–44)
AST: 18 U/L (ref 15–41)
Albumin: 2.6 g/dL — ABNORMAL LOW (ref 3.5–5.0)
Alkaline Phosphatase: 47 U/L (ref 38–126)
Anion gap: 15 (ref 5–15)
BUN: 98 mg/dL — ABNORMAL HIGH (ref 8–23)
CO2: 26 mmol/L (ref 22–32)
Calcium: 8.9 mg/dL (ref 8.9–10.3)
Chloride: 105 mmol/L (ref 98–111)
Creatinine, Ser: 2.01 mg/dL — ABNORMAL HIGH (ref 0.44–1.00)
GFR calc Af Amer: 26 mL/min — ABNORMAL LOW (ref >60)
GFR calc non Af Amer: 23 mL/min — ABNORMAL LOW (ref >60)
Glucose, Bld: 246 mg/dL — ABNORMAL HIGH (ref 70–99)
Potassium: 4.8 mmol/L (ref 3.5–5.1)
Sodium: 146 mmol/L — ABNORMAL HIGH (ref 135–145)
Total Bilirubin: 0.7 mg/dL (ref 0.3–1.2)
Total Protein: 5.3 g/dL — ABNORMAL LOW (ref 6.5–8.1)

## 2019-09-05 LAB — MAGNESIUM: Magnesium: 2 mg/dL (ref 1.7–2.4)

## 2019-09-05 LAB — PHOSPHORUS: Phosphorus: 1.8 mg/dL — ABNORMAL LOW (ref 2.5–4.6)

## 2019-09-05 LAB — SURGICAL PATHOLOGY

## 2019-09-05 MED FILL — Medication: Qty: 1 | Status: AC

## 2019-09-08 ENCOUNTER — Encounter (HOSPITAL_COMMUNITY): Payer: Self-pay | Admitting: *Deleted

## 2019-09-08 LAB — TYPE AND SCREEN
ABO/RH(D): A POS
Antibody Screen: NEGATIVE
Unit division: 0
Unit division: 0
Unit division: 0

## 2019-09-08 LAB — BPAM RBC
Blood Product Expiration Date: 202104212359
Blood Product Expiration Date: 202104232359
Blood Product Expiration Date: 202104232359
ISSUE DATE / TIME: 202104090032
Unit Type and Rh: 6200
Unit Type and Rh: 6200
Unit Type and Rh: 6200

## 2019-09-18 ENCOUNTER — Ambulatory Visit (HOSPITAL_COMMUNITY): Payer: Medicare Other | Admitting: Physician Assistant

## 2019-09-27 NOTE — Death Summary Note (Signed)
DEATH SUMMARY   Patient Details  Name: Natalie Rodgers MRN: [id number] DOB: [birth year]  Admission/Discharge Information   Admit Date:  Sep 19, 2019  Date of Death: Date of Death: Sep 29, 2019  Time of Death: Time of Death: 0825  Length of Stay: 30-Aug-2022  Referring Physician: Welford Roche, NP   Reason(s) for Hospitalization  Right Leg Pain 2/2 to Foot cellulitis/myositis in a diabetic, complicated by A Fib With RVR complicated by anaphylactic shock from Barium administration for evaluation of dysphagia.  Diagnoses  Preliminary cause of death:   Acute Cardiopulmonary arrest in the setting of likely aspiration event from hematemesis and vomiting blood secondary to her large duodenal ulcer complicated by recent anaphylactic shock from barium swallow and A. fib with RVR on admission along with Sepsis from Foot Cellulitis, present on Admission   Secondary Diagnoses (including complications and co-morbidities):  Principal Problem:   Severe sepsis ([place]) Active Problems:   SIRS (systemic inflammatory response syndrome) (HCC)   Atrial fibrillation with RVR (HCC)   AKI (acute kidney injury) ([place])   Type 2 diabetes mellitus with hyperlipidemia (HCC)   Accelerated hypertension   Dementia without behavioral disturbance (HCC)   Bilateral foot pain   Nursing home resident   Abnormal weight loss   CKD (chronic kidney disease) stage 4, GFR 15-29 ml/min (HCC)   Anemia of chronic disease   History of stroke   Right hemiparesis (HCC)   Wheezing   AF (paroxysmal atrial fibrillation) (HCC)   Prolonged QT interval   Leukocytosis   Hyperglycemia   Proteinuria   Lactic acidosis   Hypophosphatemia   Aortic atherosclerosis (HCC)   Cardiomegaly   Cellulitis in diabetic foot (HCC)   Myositis   Oral phase dysphagia   Cerebrovascular disease   Grade I diastolic dysfunction   Anaphylactic shock   Acute vs subacute cerebellar CVA (cerebrovascular accident) ([place])   Acute upper GI bleed in the setting of  aspirin therapy due to acute stroke/rapid afib   Acute blood loss anemia   Duodenal ulcer with hemorrhage   Bilateral carotid artery stenosis   Aspiration into respiratory tract   Cardiopulmonary arrest (HCC)  Foot Cellulitis and Myositis in a diabetic female with hyperglycemia on admission  Sepsis, present on Admission The source of sepsis was from bilateral foot cellulitis and myositis after MRI imaging confirmed edema findings consistent with cellulits. She had lactic acidosis and leukocytosis on admission, as well as fever, tachypnea and tachycardia, consistent with sepsis.  Alternative causes of sepsis were ruled out (no evidence of covid, UTI, PNA, inluenza). Blood cultures were negative.  She was treated with empiric Vancomycin and Cefepime, which was transitioned to Cefdinir. Despite aggressive therapy, the patient's condition was complicated by other medical co-morbidities as outlined below.  Paroxysmal Atrial Fibrillation with RVR HR initially uncontrolled, requiring initiation of a Diltiazem drip Noted to be on Coreg and aspirin as an outpatient. CHA2DS2-VASc scorecalculated to be 7, and given evidence of having had an acute versus subacute cerebellar CVA, she was maintained on ASA therapy up until 09/26/2019 when she developed an acute GI hemorrhage necessitating discontinuation. Given her h/o intracranial hemorrhage, she was not felt to be a candidate for systemic anti-coagulation. She had episodes of RVR and NSR on telemetry with the need to titrate her rate control medications.   Anaphylactic Shock The patient developed stridor and hypotension and shock concerning for anaphylaxis after receiving barium for a swallowing study. Given racemic epinephrine, solu-medrol, Pepcid, benadryl and required norepinephrine for blood pressure support to stabilize  her blood pressure. Barium was added as allergy to patient's chart.  Acute GI bleeding with Hematemesis in the setting of a Large  Duodenal Ulcer Acute Blood Loss Anemia The patient had a very large coffee-ground emesis 09/09/2019 and and was emergently taken to the endoscopy suite and placed on a PPI drip. Aspirin discontinued. Required volume resuscitation with PRBCs. Emergent EGD showed miild to distal LA grade D esophagitis as well as a large duodenal sweep ulcer with visible vessel and blood clots which was treated with epinephrine injection and gold probe cautery to achieve successful hemostasis. Kept NPO immediately after procedure with advancement to CL 4 hours post-procedure. Plan was to refer to IR for embolization of gastroduodenal artery if there was a re-bleed. The patient subsequently had another episode of hematemesis with aspiration on 09-09-2019 which led to a cardiopulmonary arrest.   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 Moderate chronic microvascular ischemic changes. 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 No evidence of recent hemorrhage. Minor chronic blood products are noted." The patient had multiple risk factors for stroke including DM, HLD, atrial fibrillation. She was evaluated by PT/OT/ST with recommendations as noted. Neurology was consulted and the patient had a 2 D Echo/carotid dopplers done. Attempts to obtain records detailing her hemorrhagic stroke were unsuccessful. Lipid panel as below as well as hemoglobin A1c   Sepsis due to foot Cellulitis/myositis in a patient with diabetes and hyperglycemia (presented with leukocytosis, tachypnea, tachycardia, fever and lactic acidosis) Initial presenting complaint was foot pain, and in the setting of SIRS, an MRI was obtained which was significant for cellulitis with mild myositis of both feet. No  external evidence noted.Placed on empiric broad spectrum antibiotics which was later narrowed to renally dosed Cefdinir.   Hematemesis/Upper GI bleed in a patient with underlying dysphasia on chronic aspirin therapy resulting in aspiration and cardiopulmonary arrest While initial CXR clear, in the setting of dysphagia and aspirin use, the patient developed hematemesis and aspirated.  CKD stage IV Creatinine of 2.02 on admission. Baseline creatinine of 2.2 per chart review. Renal function initially stable but deteriorated with shock/GIB.  Diabetes Mellitus, Type 2 Hemoglobin A1C of 7%. Patient is on glipizide as an out patient. Continued SSI while she was hospitalized CBGs had been ranging from  146-296.  Accelerated HTN in a patient with underlying essential Hypertension BP Initially uncontrolled in setting of holding antihypertensives from shock but blood pressure improved until the patient arrested.   Hyperlipidemia Lipid panel showed cholesterol/HDL ratio 3.1, cholesterol level 107, HDL 35, LDL of 143, triglycerides 144, VLDL 29. Continued ezetimibe 10 mg daily, pravastatin nightly, fenofibrate 160 mg p.o. daily while she was hospitalized.  Dementia Continued Donepezil 10 mg p.o. nightly up until the patient coded.  Dysphagia SLP consulted and barium swallow performed 4/1 and subsequently she went into anaphylactic shock as above.Heart healthy/carb modified diet started 4/6. SLP reevaluated 09/15/2019 and changed diet recommendations to dysphagia 3 diet with thin liquids however she was made n.p.o. when she started having hematemesis.  Hypophosphatemia Her phosphorus level this morning was 1.8 and unfortunately she had a cardiopulmonary arrest before this could be replete   Brief Hospital Course (including significant findings, care, treatment, and services provided and events leading to death)   Natalie Sellerswas a 79 y.o.femalewith PMH  significant for HTN, DM2 not on  insulin, CKD, dementia, chronic anemia as well as other comorbidities who patient presented secondary to right leg pain on 08/25/2019. At baseline, she resides at [facility] and does have some abnormal gait because of her right hemiparesis due to her intracranial hemorrhage previously. She was found to be febrile on admission with associated evidence concerning for Sepsis with source likely this was secondary to a right foot cellulitis and myositis. She was started on empiric antibiotics and was subsequently transitioned to p.o. antibiotics. Sepsis appeared to be improving.  Initially She also developed atrial fibrillation with RVR and started on Diltiazem IV.  She had a very high CHA2DS2-VASc Score of 7.  Echocardiogram was pursued and patient had spontaneously converted to normal sinus rhythm and cardiology was consulted 09/02/2019 for further evaluation and recommendations and were reecommending anticoagulation however there was quenstion of anticoagulant the patient given her history of hemorrhagic CVA.  Incidentally she was worked up with a brain MRI at the recommendations of neurology and she is found to have an incidental CVA so neurology was formally consulted and will still try and obtain records about the patient's history of her intracranial hemorrhage in order to help determine Anticoagulation Recc's.  During the course of her hospitalization she underwent SLP evaluation with a barium swallow for her possible dysphagia secondary to her dementia and she ended up going into anaphylactic shock and had increased work of breathing with tachypnea.  She was using accessory muscles with stridor and had a worsening mental status and became hypotensive and had more slurred speech.  Her antihypertensives were held and she was given a stat chest x-ray and a 1 L normal saline bolus.  Critical care was consulted secondary to her worsening respiratory status and she was started on racemic epinephrine, Solu-Medrol,  Benadryl as well as Pepcid.  She is moved to the intensive care unit and started on epinephrine infusion with titration to keep a map above 65.  Barium was listed as an allergy subsequently patient was able to be easily weaned off of pressors and improved back to baseline.  Critical care transfer the patient back to [facility] service  Initially she was anticipating discharging to SNF once the issue of anticoagulation was resolved and carotid Dopplers are obtained in addition to neurology.    Wanted to to find out the etiology of intracranial hemorrhage to safely anticoagulate this patient, however currently anticoagulation became a nonissue not an issue as she ended up having acute GI bleed and had some coffee-ground emesis yesterday.  GI was consulted and she was placed on a Protonix drip and she was found to have a large duodenal ulcer and she was taken for EGD and this was treated with a gold probe cautery as well as injections.  Patient had no evidence of residual bleeding and she is transferred back to the medical floor and placed on a clear liquid diet 4 hours after.  Because of her high risk of decompensation palliative care was consulted for further goals of care discussion however they were unable to see the patient yesterday and there were planning on seeing the patient today  Throughout the course of the night last night the patient had dropped her hemoglobin and she was typed and screened and transfused 1 unit of PRBCs.  This morning her labs were reviewed and she was doing okay but nursing states that she vomited coffee-ground emesis again and aspirated.  Subsequently she went into cardiopulmonary arrest and  a CODE BLUE was called around 8 AM this morning.  She was found to be in PEA arrest and underwent multiple cycles of chest compressions epinephrine and then went into asystole.  The ED physician came up and intubated the patient while she was actively vomiting blood during the chest compressions.   She was also given multiple doses of bicarbonate and there is a brief period where her pulse was regained however it was lost again and chest compressions were restarted.  Despite resuscitative efforts, we were not able to bring the patient back and CODE was stopped patient passed away on 09/27/2019 at 825 this a.m. The Patient's Daughter was notified and she came to the hospital.   Pertinent Labs and Studies  Significant Diagnostic Studies MR BRAIN WO CONTRAST  Result Date: 09/03/2019 CLINICAL DATA:  Anticoagulation for atrial fibrillation, history of intracranial hemorrhage EXAM: MRI HEAD WITHOUT CONTRAST TECHNIQUE: Multiplanar, multiecho pulse sequences of the brain and surrounding structures were obtained without intravenous contrast. COMPARISON:  None. FINDINGS: Brain: There is a punctate focus of mildly reduced diffusion within the anterior inferior left cerebellum. Patchy and confluent areas of T2 hyperintensity in the supratentorial white matter are nonspecific may reflect moderate chronic microvascular ischemic changes. There are chronic small vessel infarcts of the basal ganglia and adjacent white matter bilaterally as well as the thalamus bilaterally. Possible chronic blood products associated with right thalamic infarct. There is a small chronic cortical infarct of the inferior left temporal lobe. Small focus of susceptibility the left temporal lobe also likely reflects chronic microhemorrhage. Prominence of the ventricles and sulci reflects generalized parenchymal volume loss. There is no intracranial mass, mass effect, or edema. There is no hydrocephalus or extra-axial fluid collection. Vascular: Major vessel flow voids at the skull base are preserved. Skull and upper cervical spine: Normal marrow signal is preserved. Sinuses/Orbits: Paranasal sinuses are aerated. Orbits are unremarkable. Other: Sella is unremarkable. Mild patchy mastoid fluid opacification. IMPRESSION: Small, likely late acute  infarct of the left cerebellum. Moderate chronic microvascular ischemic changes. 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 No evidence of recent hemorrhage. Minor chronic blood products are noted. Electronically Signed   By: Macy Mis M.D.   On: 09/03/2019 07:17   MR FOOT RIGHT WO CONTRAST  Result Date: 08/28/2019 CLINICAL DATA:  Foot pain and swelling. Diabetic. EXAM: MRI OF THE RIGHT FOREFOOT WITHOUT CONTRAST TECHNIQUE: Multiplanar, multisequence MR imaging of the right foot was performed. No intravenous contrast was administered. COMPARISON:  Radiographs 08/27/2019 FINDINGS: Bones/Joint/Cartilage Remote healed second, third and fourth metatarsal neck fractures. No acute fracture is identified. No destructive bony changes to suggest osteomyelitis. No MR findings suspicious for septic arthritis. Mild subcutaneous soft tissue swelling/edema/fluid suggesting cellulitis. No focal soft tissue abscess is identified. Mild edema like changes involving the forefoot musculature suggesting myositis. No MR findings to suggest pyomyositis. The major tendons and ligaments appear intact. The plantar fascia is intact. IMPRESSION: 1. Cellulitis and mild myositis but no focal soft tissue abscess or pyomyositis. 2. Remote healed second, third and fourth metatarsal neck fractures. 3. No MR findings to suggest septic arthritis or osteomyelitis. Electronically Signed   By: Marijo Sanes M.D.   On: 08/28/2019 05:01   MR FOOT LEFT WO CONTRAST  Result Date: 08/28/2019 CLINICAL DATA:  Pain and swelling. Diabetic. EXAM: MRI OF THE LEFT FOOT WITHOUT CONTRAST TECHNIQUE: Multiplanar, multisequence MR imaging of the left foot was performed. No intravenous contrast was administered. COMPARISON:  Radiographs  08/05/2019 FINDINGS: Diffuse subcutaneous soft tissue swelling/edema/fluid suggesting cellulitis. No discrete soft tissue abscess. There is also mild myositis but no findings for  pyomyositis. Mild forefoot and moderate midfoot degenerative changes most notable at the first and second tarsal metatarsal joints with joint space narrowing, joint effusions and subchondral cystic changes. No fracture or findings for osteomyelitis. No findings suspicious for septic arthritis. There is a fragmented appearance of the medial sesamoid of the great toe which could be remote posttraumatic changes or developmental. I do not see any significant signal abnormality to suggest osteomyelitis. The major tendons and ligaments appear intact. The plantar fascia is intact. Changes of plantar fasciitis are noted. IMPRESSION: 1. Diffuse subcutaneous soft tissue swelling/edema/fluid suggesting cellulitis. No discrete soft tissue abscess. 2. Mild myositis but no findings for pyomyositis. 3. Mild forefoot and moderate midfoot degenerative changes but no findings for septic arthritis or osteomyelitis. 4. Remote posttraumatic or developmental changes involving the medial sesamoid of the great toe. Electronically Signed   By: Marijo Sanes M.D.   On: 08/28/2019 05:08   DG CHEST PORT 1 VIEW  Result Date: 08/28/2019 CLINICAL DATA:  Tachypnea and wheezing. EXAM: PORTABLE CHEST 1 VIEW COMPARISON:  Radiograph 2 days ago 08/09/2019 FINDINGS: Patient is rotated. Mild cardiomegaly, unchanged. Atherosclerosis of the aortic arch. Pulmonary vasculature is normal. No consolidation, pleural effusion, or pneumothorax. No acute osseous abnormalities are seen. Chronic changes of the right shoulder. IMPRESSION: Stable mild cardiomegaly without acute abnormality. Electronically Signed   By: Keith Rake M.D.   On: 08/28/2019 12:18   DG Chest Portable 1 View  Result Date: 08/16/2019 CLINICAL DATA:  Fever EXAM: PORTABLE CHEST 1 VIEW COMPARISON:  None. FINDINGS: Lungs are clear. Heart is borderline enlarged with pulmonary vascularity normal. No adenopathy. There is aortic atherosclerosis. No bone lesions. IMPRESSION: Lungs clear.  Borderline cardiac enlargement. Aortic Atherosclerosis (ICD10-I70.0). Electronically Signed   By: Lowella Grip III M.D.   On: 08/03/2019 13:48   DG Foot 2 Views Left  Result Date: 08/03/2019 CLINICAL DATA:  Left-sided foot pain elevated white count EXAM: LEFT FOOT - 2 VIEW COMPARISON:  None. FINDINGS: Partially visualized fixating screws within the distal tibia and distal fibula. No fracture or malalignment. Questionable lucency and erosive change at the sesamoid bone adjacent to the first metatarsal head. Negative for soft tissue emphysema. Moderate plantar calcaneal spur. IMPRESSION: 1. Possible lucency and erosive change at the sesamoid bone adjacent to the head of first metatarsal, correlate with physical exam for signs or symptoms of infection in this region. MRI follow-up could be obtained as clinically warranted. Electronically Signed   By: Donavan Foil M.D.   On: 08/02/2019 18:00   DG Foot Complete Right  Result Date: 08/27/2019 CLINICAL DATA:  Nonresponsive.  Foot pain. EXAM: RIGHT FOOT COMPLETE - 3+ VIEW COMPARISON:  02/24/2016 FINDINGS: Interval healing of the third and fourth metatarsal neck fractures. Remote bimalleolar fracture and repair. Hammertoe deformity on the lateral view. No acute fracture or malalignment. IMPRESSION: No acute finding. Electronically Signed   By: Monte Fantasia M.D.   On: 08/27/2019 10:50   DG Swallowing Func-Speech Pathology  Result Date: 08/28/2019 Objective Swallowing Evaluation: Type of Study: MBS-Modified Barium Swallow Study  Patient Details Name: Dwayna Kentner MRN: [id number] Date of Birth: 09/22/1940 Today's Date: 08/28/2019 Time: SLP Start Time (ACUTE ONLY): 1700 -SLP Stop Time (ACUTE ONLY): 1716 SLP Time Calculation (min) (ACUTE ONLY): 16 min Past Medical History: Past Medical History: Diagnosis Date . Chronic kidney disease   CKD Stage IV .  Dementia ([place])  . Diabetes mellitus without complication ([place])  . GERD (gastroesophageal reflux disease)  .  Hypertension  Past Surgical History: Past Surgical History: Procedure Laterality Date . ABDOMINAL HYSTERECTOMY    salpinoophorectomy . LAPAROTOMY   HPI: Pt is 79 yo female with PMH including HTN, DM2, CKD, mild dementia, anemia, oncology care, hx of ICH with R hemiparesis 2016. Pt presented to ED with complaint of leg and foot pain.  Imaging of feet - negative for acute fx.  Pt admitted with SIRS (no clear source per note) and afib with RVR.    Swallow evaluation ordered due to pt overtly coughing with water today.  RN reports pt tolerated po medications well.  Pt was tachycardic and febrile upon admission.  Urinalysis was negative as well CXR.  Pt resides at [facility].  Per MD note, pt follows some directions and is disoriented to location.  Pt denies difficulty with swallowing.  Subjective: pt sitting upright in swallow function chair Assessment / Plan / Recommendation CHL IP CLINICAL IMPRESSIONS 08/28/2019 Clinical Impression Pt with mild oral and functional pharyngeal swallow without aspiration of any consistency tested.  Minimal laryngeal penetration with thin noted- Swallow was strong and timely without pharyngeal retention.  Her respiratory and swallowing reciprocity was intact.  Piecemealing with solids functional.  Of note, pt appeared to swallow multiple times with each bolus *when flouro was not turned on - SLP questions if this is simply behavioral.  Recommend advance diet to dys3/thin, medicine with puree and small frequent meals due to pt's dyspnea with intake and to accommodate pt's known esophageal reflux. Will follow briefly given pt's mild oral deficits/discoordination that may contribute to aspiration of prematurely spilled boluses into larynx.  Of note, daughter Verdis Frederickson was educated to finding when SLP posted swallow precaution sign. SLP Visit Diagnosis Dysphagia, oral phase (R13.11) Attention and concentration deficit following -- Frontal lobe and executive function deficit following -- Impact on  safety and function Mild aspiration risk   CHL IP TREATMENT RECOMMENDATION 08/28/2019 Treatment Recommendations Therapy as outlined in treatment plan below   Prognosis 08/28/2019 Prognosis for Safe Diet Advancement Good Barriers to Reach Goals Cognitive deficits Barriers/Prognosis Comment -- CHL IP DIET RECOMMENDATION 08/28/2019 SLP Diet Recommendations Dysphagia 3 (Mech soft) solids;Thin liquid Liquid Administration via Cup;Straw Medication Administration Whole meds with puree Compensations Slow rate;Small sips/bites Postural Changes --   No flowsheet data found.  CHL IP FOLLOW UP RECOMMENDATIONS 08/28/2019 Follow up Recommendations None   CHL IP FREQUENCY AND DURATION 08/28/2019 Speech Therapy Frequency (ACUTE ONLY) min 1 x/week Treatment Duration 1 week      CHL IP ORAL PHASE 08/28/2019 Oral Phase Impaired Oral - Pudding Teaspoon -- Oral - Pudding Cup -- Oral - Honey Teaspoon -- Oral - Honey Cup -- Oral - Nectar Teaspoon -- Oral - Nectar Cup Premature spillage;Decreased bolus cohesion Oral - Nectar Straw -- Oral - Thin Teaspoon -- Oral - Thin Cup Premature spillage;Decreased bolus cohesion Oral - Thin Straw Premature spillage;Decreased bolus cohesion Oral - Puree WFL Oral - Mech Soft WFL Oral - Regular -- Oral - Multi-Consistency -- Oral - Pill WFL Oral Phase - Comment --  CHL IP PHARYNGEAL PHASE 08/28/2019 Pharyngeal Phase Impaired Pharyngeal- Pudding Teaspoon -- Pharyngeal -- Pharyngeal- Pudding Cup -- Pharyngeal -- Pharyngeal- Honey Teaspoon -- Pharyngeal -- Pharyngeal- Honey Cup -- Pharyngeal -- Pharyngeal- Nectar Teaspoon -- Pharyngeal -- Pharyngeal- Nectar Cup [facility] Pharyngeal Material does not enter airway Pharyngeal- Nectar Straw WFL Pharyngeal Material does not enter airway Pharyngeal- Thin  Teaspoon -- Pharyngeal -- Pharyngeal- Thin Cup [facility];Penetration/Aspiration during swallow Pharyngeal Material enters airway, remains ABOVE vocal cords then ejected out Pharyngeal- Thin Straw WFL Pharyngeal Material does not enter  airway Pharyngeal- Puree WFL Pharyngeal Material does not enter airway Pharyngeal- Mechanical Soft WFL Pharyngeal Material does not enter airway Pharyngeal- Regular -- Pharyngeal -- Pharyngeal- Multi-consistency -- Pharyngeal -- Pharyngeal- Pill WFL Pharyngeal Material does not enter airway Pharyngeal Comment --  CHL IP CERVICAL ESOPHAGEAL PHASE 08/28/2019 Cervical Esophageal Phase -- Pudding Teaspoon -- Pudding Cup -- Honey Teaspoon -- Honey Cup -- Nectar Teaspoon -- Nectar Cup -- Nectar Straw -- Thin Teaspoon -- Thin Cup -- Thin Straw -- Puree -- Mechanical Soft -- Regular -- Multi-consistency -- Pill -- Cervical Esophageal Comment Upon esophageal sweep, pt appears with trace residuals, radiologist not present to confirm. Kathleen Lime, MS [facility] SLP Acute Rehab Services Office [phone] Macario Golds 08/28/2019, 10:55 AM              ECHOCARDIOGRAM COMPLETE  Result Date: 08/30/2019    ECHOCARDIOGRAM REPORT   Patient Name:   [facility] Date of Exam: 08/30/2019 Medical Rec #:  [id number]         Height:       60.0 in Accession #:    [id number]        Weight:       149.5 lb Date of Birth:  10-08-1940         BSA:          1.649 m Patient Age:    19 years          BP:           143/78 mmHg Patient Gender: F                 HR:           119 bpm. Exam Location:  Inpatient Procedure: 2D Echo, Cardiac Doppler and Color Doppler Indications:    I48.0 Paroxysmal atrial fibrillation  History:        Patient has no prior history of Echocardiogram examinations.                 Risk Factors:Hypertension and Diabetes. CKD. GERD.  Sonographer:    Jonelle Sidle Dance Referring Phys: Baldwin  1. Hyperdynamic LV systolic function; grade 1 diastolic dysfunction; elevated mean gradient across aortic valve likely related to vigorous LV function as visually aortic valve opens well.  2. Left ventricular ejection fraction, by estimation, is >75%. The left ventricle has hyperdynamic function. The left ventricle has no  regional wall motion abnormalities. Left ventricular diastolic parameters are consistent with Grade I diastolic dysfunction (impaired relaxation).  3. Right ventricular systolic function is normal. The right ventricular size is normal.  4. The mitral valve is normal in structure. No evidence of mitral valve regurgitation. No evidence of mitral stenosis.  5. The aortic valve is tricuspid. Aortic valve regurgitation is not visualized. Mild to moderate aortic valve sclerosis/calcification is present, without any evidence of aortic stenosis.  6. The inferior vena cava is normal in size with greater than 50% respiratory variability, suggesting right atrial pressure of 3 mmHg. FINDINGS  Left Ventricle: Left ventricular ejection fraction, by estimation, is >75%. The left ventricle has hyperdynamic function. The left ventricle has no regional wall motion abnormalities. The left ventricular internal cavity size was normal in size. There is no left ventricular hypertrophy. Left ventricular diastolic parameters are consistent with Grade I diastolic  dysfunction (impaired relaxation). Right Ventricle: The right ventricular size is normal.Right ventricular systolic function is normal. Left Atrium: Left atrial size was normal in size. Right Atrium: Right atrial size was normal in size. Pericardium: There is no evidence of pericardial effusion. Mitral Valve: The mitral valve is normal in structure. Normal mobility of the mitral valve leaflets. Mild mitral annular calcification. No evidence of mitral valve regurgitation. No evidence of mitral valve stenosis. Tricuspid Valve: The tricuspid valve is normal in structure. Tricuspid valve regurgitation is trivial. No evidence of tricuspid stenosis. Aortic Valve: The aortic valve is tricuspid. Aortic valve regurgitation is not visualized. Mild to moderate aortic valve sclerosis/calcification is present, without any evidence of aortic stenosis. Aortic valve mean gradient measures 12.0  mmHg. Aortic valve peak gradient measures 24.4 mmHg. Aortic valve area, by VTI measures 1.41 cm. Pulmonic Valve: The pulmonic valve was not well visualized. Pulmonic valve regurgitation is not visualized. No evidence of pulmonic stenosis. Aorta: The aortic root is normal in size and structure. Venous: The inferior vena cava is normal in size with greater than 50% respiratory variability, suggesting right atrial pressure of 3 mmHg.  Additional Comments: Hyperdynamic LV systolic function; grade 1 diastolic dysfunction; elevated mean gradient across aortic valve likely related to vigorous LV function as visually aortic valve opens well.  LEFT VENTRICLE PLAX 2D LVIDd:         3.60 cm LVIDs:         2.60 cm LV PW:         1.10 cm LV IVS:        1.00 cm LVOT diam:     2.00 cm LV SV:         58 LV SV Index:   35 LVOT Area:     3.14 cm  RIGHT VENTRICLE             IVC RV Basal diam:  2.30 cm     IVC diam: 1.70 cm RV S prime:     16.60 cm/s TAPSE (M-mode): 1.7 cm LEFT ATRIUM             Index       RIGHT ATRIUM           Index LA diam:        3.30 cm 2.00 cm/m  RA Area:     12.80 cm LA Vol (A2C):   17.9 ml 10.85 ml/m RA Volume:   29.90 ml  18.13 ml/m LA Vol (A4C):   19.9 ml 12.07 ml/m LA Biplane Vol: 20.8 ml 12.61 ml/m  AORTIC VALVE AV Area (Vmax):    1.30 cm AV Area (Vmean):   1.36 cm AV Area (VTI):     1.41 cm AV Vmax:           247.00 cm/s AV Vmean:          155.500 cm/s AV VTI:            0.416 m AV Peak Grad:      24.4 mmHg AV Mean Grad:      12.0 mmHg LVOT Vmax:         101.95 cm/s LVOT Vmean:        67.350 cm/s LVOT VTI:          0.186 m LVOT/AV VTI ratio: 0.45  AORTA Ao Root diam: 3.60 cm Ao Asc diam:  3.40 cm MV A velocity: 128.00 cm/s  SHUNTS                             Systemic VTI:  0.19 m                             Systemic Diam: 2.00 cm Kirk Ruths MD Electronically signed by Kirk Ruths MD Signature Date/Time: 08/30/2019/3:04:54 PM    Final    VAS US  CAROTID  Result Date: 09/01/2019 Carotid Arterial Duplex Study Indications:       CVA. Risk Factors:      None. Limitations        Today's exam was limited due to the body habitus of the                    patient, the patient's inability or unwillingness to                    cooperate and patient positioning. Comparison Study:  No prior studies. Performing Technologist: Oliver Hum RVT  Examination Guidelines: A complete evaluation includes B-mode imaging, spectral Doppler, color Doppler, and power Doppler as needed of all accessible portions of each vessel. Bilateral testing is considered an integral part of a complete examination. Limited examinations for reoccurring indications may be performed as noted.  Right Carotid Findings: +----------+--------+--------+--------+-----------------------+--------+           PSV cm/sEDV cm/sStenosisPlaque Description     Comments +----------+--------+--------+--------+-----------------------+--------+ CCA Prox  49      15              smooth and heterogenous         +----------+--------+--------+--------+-----------------------+--------+ CCA Distal48      14              smooth and heterogenous         +----------+--------+--------+--------+-----------------------+--------+ ICA Prox  48      19              smooth and heterogenous         +----------+--------+--------+--------+-----------------------+--------+ ICA Distal62      23                                     tortuous +----------+--------+--------+--------+-----------------------+--------+ ECA       74      10                                              +----------+--------+--------+--------+-----------------------+--------+ +----------+--------+-------+--------+-------------------+           PSV cm/sEDV cmsDescribeArm Pressure (mmHG) +----------+--------+-------+--------+-------------------+ [id number]                                          +----------+--------+-------+--------+-------------------+ +---------+--------+--+--------+--+---------+ VertebralPSV cm/s47EDV cm/s12Antegrade +---------+--------+--+--------+--+---------+  Left Carotid Findings: +----------+--------+--------+--------+-----------------------+--------+           PSV cm/sEDV cm/sStenosisPlaque Description     Comments +----------+--------+--------+--------+-----------------------+--------+ CCA Prox  59      13              smooth and heterogenous         +----------+--------+--------+--------+-----------------------+--------+ CCA Distal40  7               smooth and heterogenous         +----------+--------+--------+--------+-----------------------+--------+ ICA Prox  39      13              smooth and heterogenous         +----------+--------+--------+--------+-----------------------+--------+ ICA Distal43      18                                     tortuous +----------+--------+--------+--------+-----------------------+--------+ ECA       47      6                                               +----------+--------+--------+--------+-----------------------+--------+ +----------+--------+--------+--------+-------------------+           PSV cm/sEDV cm/sDescribeArm Pressure (mmHG) +----------+--------+--------+--------+-------------------+ [id number]                                          +----------+--------+--------+--------+-------------------+ +---------+--------+--+--------+--+---------+ VertebralPSV cm/s58EDV cm/s15Antegrade +---------+--------+--+--------+--+---------+   Summary: Right Carotid: Velocities in the right ICA are consistent with a 1-39% stenosis. Left Carotid: Velocities in the left ICA are consistent with a 1-39% stenosis. Vertebrals: Bilateral vertebral arteries demonstrate antegrade flow. *See table(s) above for measurements and observations.  Electronically signed by Curt Jews MD on  08/28/2019 at 3:15:53 PM.    Final     Microbiology Recent Results (from the past 240 hour(s))  Urine culture     Status: None   Collection Time: 08/15/2019  3:19 PM   Specimen: Urine, Catheterized  Result Value Ref Range Status   Specimen Description   Final    URINE, CATHETERIZED Performed at [facility] [address] [place], [place] [address]    Special Requests   Final    NONE Performed at [facility], [place] [address] [place], [place] [address]    Culture   Final    NO GROWTH Performed at [facility], [place] [address] [place], [place] [address]    Report Status 08/27/2019 FINAL  Final  MRSA PCR Screening     Status: None   Collection Time: 08/27/19  1:46 AM   Specimen: Nasal Mucosa; Nasopharyngeal  Result Value Ref Range Status   MRSA by PCR NEGATIVE NEGATIVE Final    Comment:        The GeneXpert MRSA Assay (FDA approved for NASAL specimens only), is one component of a comprehensive MRSA colonization surveillance program. It is not intended to diagnose MRSA infection nor to guide or monitor treatment for MRSA infections. Performed at [facility], [place] [address], [place], [place] [address]   Respiratory Panel by RT PCR (Flu A&B, Covid) - Nasopharyngeal Swab     Status: None   Collection Time: 09/03/19 12:28 PM   Specimen: Nasopharyngeal Swab  Result Value Ref Range Status   SARS Coronavirus 2 by RT PCR NEGATIVE NEGATIVE Final    Comment: (NOTE) SARS-CoV-2 target nucleic acids are NOT DETECTED. The SARS-CoV-2 RNA is generally detectable in upper respiratoy specimens during the acute phase of infection. The lowest concentration of SARS-CoV-2 viral copies  this assay can detect is 131 copies/mL. A negative result does not preclude SARS-Cov-2 infection and should not be used as the sole basis for treatment or other patient management decisions. A negative result may occur with  improper specimen  collection/handling, submission of specimen other than nasopharyngeal swab, presence of viral mutation(s) within the areas targeted by this assay, and inadequate number of viral copies (<131 copies/mL). A negative result must be combined with clinical observations, patient history, and epidemiological information. The expected result is Negative. Fact Sheet for Patients:  PinkCheek.be Fact Sheet for Healthcare Providers:  GravelBags.it This test is not yet ap proved or cleared by the [place] FDA and  has been authorized for detection and/or diagnosis of SARS-CoV-2 by FDA under an Emergency Use Authorization (EUA). This EUA will remain  in effect (meaning this test can be used) for the duration of the COVID-19 declaration under Section 564(b)(1) of the Act, 21 U.S.C. section 360bbb-3(b)(1), unless the authorization is terminated or revoked sooner.    Influenza A by PCR NEGATIVE NEGATIVE Final   Influenza B by PCR NEGATIVE NEGATIVE Final    Comment: (NOTE) The Xpert Xpress SARS-CoV-2/FLU/RSV assay is intended as an aid in  the diagnosis of influenza from Nasopharyngeal swab specimens and  should not be used as a sole basis for treatment. Nasal washings and  aspirates are unacceptable for Xpert Xpress SARS-CoV-2/FLU/RSV  testing. Fact Sheet for Patients: PinkCheek.be Fact Sheet for Healthcare Providers: GravelBags.it This test is not yet approved or cleared by the [place] FDA and  has been authorized for detection and/or diagnosis of SARS-CoV-2 by  FDA under an Emergency Use Authorization (EUA). This EUA will remain  in effect (meaning this test can be used) for the duration of the  Covid-19 declaration under Section 564(b)(1) of the Act, 21  U.S.C. section 360bbb-3(b)(1), unless the authorization is  terminated or revoked. Performed at [facility], [place] [address], [place], [place] [address]    Lab Basic Metabolic Panel: Recent Labs  Lab 08/30/19 0300 08/30/19 1818 09/02/19 0518 09/03/19 0555 09/09/2019 0603 Sep 29, 2019 0545  NA 142  --  143 144 141 146*  K 3.8  --  3.8 3.8 4.2 4.8  CL 112*  --  109 109 101 105  CO2 20*  --  25 24 29 26   GLUCOSE 258*  --  188* 187* 226* 246*  BUN 51*  --  42* 53* 77* 98*  CREATININE 1.87*  --  1.38* 1.66* 1.84* 2.01*  CALCIUM 8.5*  --  8.7* 8.8* 8.8* 8.9  MG  --  2.0  --   --  1.9 2.0  PHOS  --   --   --   --  2.4* 1.8*   Liver Function Tests: Recent Labs  Lab 09/11/2019 0603 09/29/19 0545  AST 20 18  ALT 31 27  ALKPHOS 61 47  BILITOT 0.6 0.7  PROT 5.7* 5.3*  ALBUMIN 2.7* 2.6*   No results for input(s): LIPASE, AMYLASE in the last 168 hours. No results for input(s): AMMONIA in the last 168 hours. CBC: Recent Labs  Lab 08/30/19 0300 08/30/19 0300 09/01/19 0534 09/01/19 0534 09/03/19 0555 09/03/2019 0603 09/03/2019 1201 08/30/2019 1939 2019/09/29 0545  WBC 16.6*  --  12.5*  --  12.6* 13.0*  --   --  18.0*  NEUTROABS  --   --   --   --   --  10.0*  --   --  14.4*  HGB  10.0*   < > 10.1*   < > 9.0* 8.2* 7.8* 6.7* 8.6*  HCT 31.6*   < > 31.9*   < > 28.8* 26.3* 25.0* 21.8* 26.7*  MCV 95.2  --  94.9  --  96.0 94.9  --   --  93.7  PLT 301  --  301  --  322 399  --   --  342   < > = values in this interval not displayed.   Cardiac Enzymes: No results for input(s): CKTOTAL, CKMB, CKMBINDEX, TROPONINI in the last 168 hours. Sepsis Labs: Recent Labs  Lab 09/01/19 0534 09/03/19 0555 09/19/2019 0603 Sep 11, 2019 0545  WBC 12.5* 12.6* 13.0* 18.0*    Procedures/Operations  ECHOCARDIOGRAM 08/30/19 IMPRESSIONS    1. Hyperdynamic LV systolic function; grade 1 diastolic dysfunction;  elevated mean gradient across aortic valve likely related to vigorous LV  function as visually aortic valve opens well.  2. Left ventricular ejection fraction, by estimation, is >75%. The left   ventricle has hyperdynamic function. The left ventricle has no regional  wall motion abnormalities. Left ventricular diastolic parameters are  consistent with Grade I diastolic  dysfunction (impaired relaxation).  3. Right ventricular systolic function is normal. The right ventricular  size is normal.  4. The mitral valve is normal in structure. No evidence of mitral valve  regurgitation. No evidence of mitral stenosis.  5. The aortic valve is tricuspid. Aortic valve regurgitation is not  visualized. Mild to moderate aortic valve sclerosis/calcification is  present, without any evidence of aortic stenosis.  6. The inferior vena cava is normal in size with greater than 50%  respiratory variability, suggesting right atrial pressure of 3 mmHg.   FINDINGS  Left Ventricle: Left ventricular ejection fraction, by estimation, is  >75%. The left ventricle has hyperdynamic function. The left ventricle has  no regional wall motion abnormalities. The left ventricular internal  cavity size was normal in size. There  is no left ventricular hypertrophy. Left ventricular diastolic parameters  are consistent with Grade I diastolic dysfunction (impaired relaxation).   Right Ventricle: The right ventricular size is normal.Right ventricular  systolic function is normal.   Left Atrium: Left atrial size was normal in size.   Right Atrium: Right atrial size was normal in size.   Pericardium: There is no evidence of pericardial effusion.   Mitral Valve: The mitral valve is normal in structure. Normal mobility of  the mitral valve leaflets. Mild mitral annular calcification. No evidence  of mitral valve regurgitation. No evidence of mitral valve stenosis.   Tricuspid Valve: The tricuspid valve is normal in structure. Tricuspid  valve regurgitation is trivial. No evidence of tricuspid stenosis.   Aortic Valve: The aortic valve is tricuspid. Aortic valve regurgitation is  not visualized. Mild to  moderate aortic valve sclerosis/calcification is  present, without any evidence of aortic stenosis. Aortic valve mean  gradient measures 12.0 mmHg. Aortic  valve peak gradient measures 24.4 mmHg. Aortic valve area, by VTI measures  1.41 cm.   Pulmonic Valve: The pulmonic valve was not well visualized. Pulmonic valve  regurgitation is not visualized. No evidence of pulmonic stenosis.   Aorta: The aortic root is normal in size and structure.   Venous: The inferior vena cava is normal in size with greater than 50%  respiratory variability, suggesting right atrial pressure of 3 mmHg.    Additional Comments: Hyperdynamic LV systolic function; grade 1 diastolic  dysfunction; elevated mean gradient across aortic valve likely related to  vigorous LV function as visually aortic valve opens well.     LEFT VENTRICLE  PLAX 2D  LVIDd:     3.60 cm  LVIDs:     2.60 cm  LV PW:     1.10 cm  LV IVS:    1.00 cm  LVOT diam:   2.00 cm  LV SV:     58  LV SV Index:  35  LVOT Area:   3.14 cm     RIGHT VENTRICLE       IVC  RV Basal diam: 2.30 cm   IVC diam: 1.70 cm  RV S prime:   16.60 cm/s  TAPSE (M-mode): 1.7 cm   LEFT ATRIUM       Index    RIGHT ATRIUM      Index  LA diam:    3.30 cm 2.00 cm/m RA Area:   12.80 cm  LA Vol (A2C):  17.9 ml 10.85 ml/m RA Volume:  29.90 ml 18.13 ml/m  LA Vol (A4C):  19.9 ml 12.07 ml/m  LA Biplane Vol: 20.8 ml 12.61 ml/m  AORTIC VALVE  AV Area (Vmax):  1.30 cm  AV Area (Vmean):  1.36 cm  AV Area (VTI):   1.41 cm  AV Vmax:      247.00 cm/s  AV Vmean:     155.500 cm/s  AV VTI:      0.416 m  AV Peak Grad:   24.4 mmHg  AV Mean Grad:   12.0 mmHg  LVOT Vmax:     101.95 cm/s  LVOT Vmean:    67.350 cm/s  LVOT VTI:     0.186 m  LVOT/AV VTI ratio: 0.45    AORTA  Ao Root diam: 3.60 cm  Ao Asc diam: 3.40 cm   MV A velocity: 128.00 cm/s                SHUNTS               Systemic VTI: 0.19 m               Systemic Diam: 2.00 cm   EGD 08/29/2019 Findings: LA Grade D (one or more mucosal breaks involving at least 75% of  esophageal circumference) esophagitis with bleeding was found in the  lower third of the esophagus. Hematin (altered blood/coffee-ground-like material) was found in the  gastric fundus. Biopsies were taken with a cold forceps for histology. There is no endoscopic evidence of bleeding in the entire examined  stomach. One non-obstructing non-bleeding cratered duodenal ulcer with a visible  vessel and blood clot was found in the first portion of the duodenum.  The lesion was 15 mm in largest dimension. Area was successfully  injected with 3 mL of a 1:10,000 solution of epinephrine for hemostasis.  After that, blood clots were removed. Fulguration to ablate the lesion  to prevent bleeding by Rolanda Lundborg was successful. Few non-bleeding superficial duodenal ulcers were found in the duodenal  bulb. Impression: - LA Grade D esophagitis with bleeding. - Hematin (altered blood/coffee-ground-like  material) in the gastric fundus. Biopsied. - Non-obstructing non-bleeding duodenal ulcer with  a visible vessel. Injected. SKIP. - Non-bleeding duodenal ulcers.  Intubated 09-13-2019   Kerney Elbe, DO 09-13-2019, 3:06 PM

## 2019-09-27 NOTE — Progress Notes (Signed)
Vinnie Level Secretary was notified by tele that patient was asytole. Vinnie Level went and checked on patient and then walked down to another patient room where a rapid response was being conducted and notified myself, charge nurse Carie Caddy and Nurse [employer]. [facility] and [place] ran out of room to go assess patient. Patient was found non responsive and a code was called and CPR started. I grabbed code cart and directed interim assistant director Durwin Nora, RN to patient room.

## 2019-09-27 DEATH — deceased

## 2021-11-25 IMAGING — DX DG CHEST 1V PORT
1 series · 1 of 1 positions shown · non-contrast
Comparison: Radiograph 2 days ago 08/26/2019

CLINICAL DATA: Tachypnea and wheezing.

EXAM:
PORTABLE CHEST 1 VIEW

[chest ap]
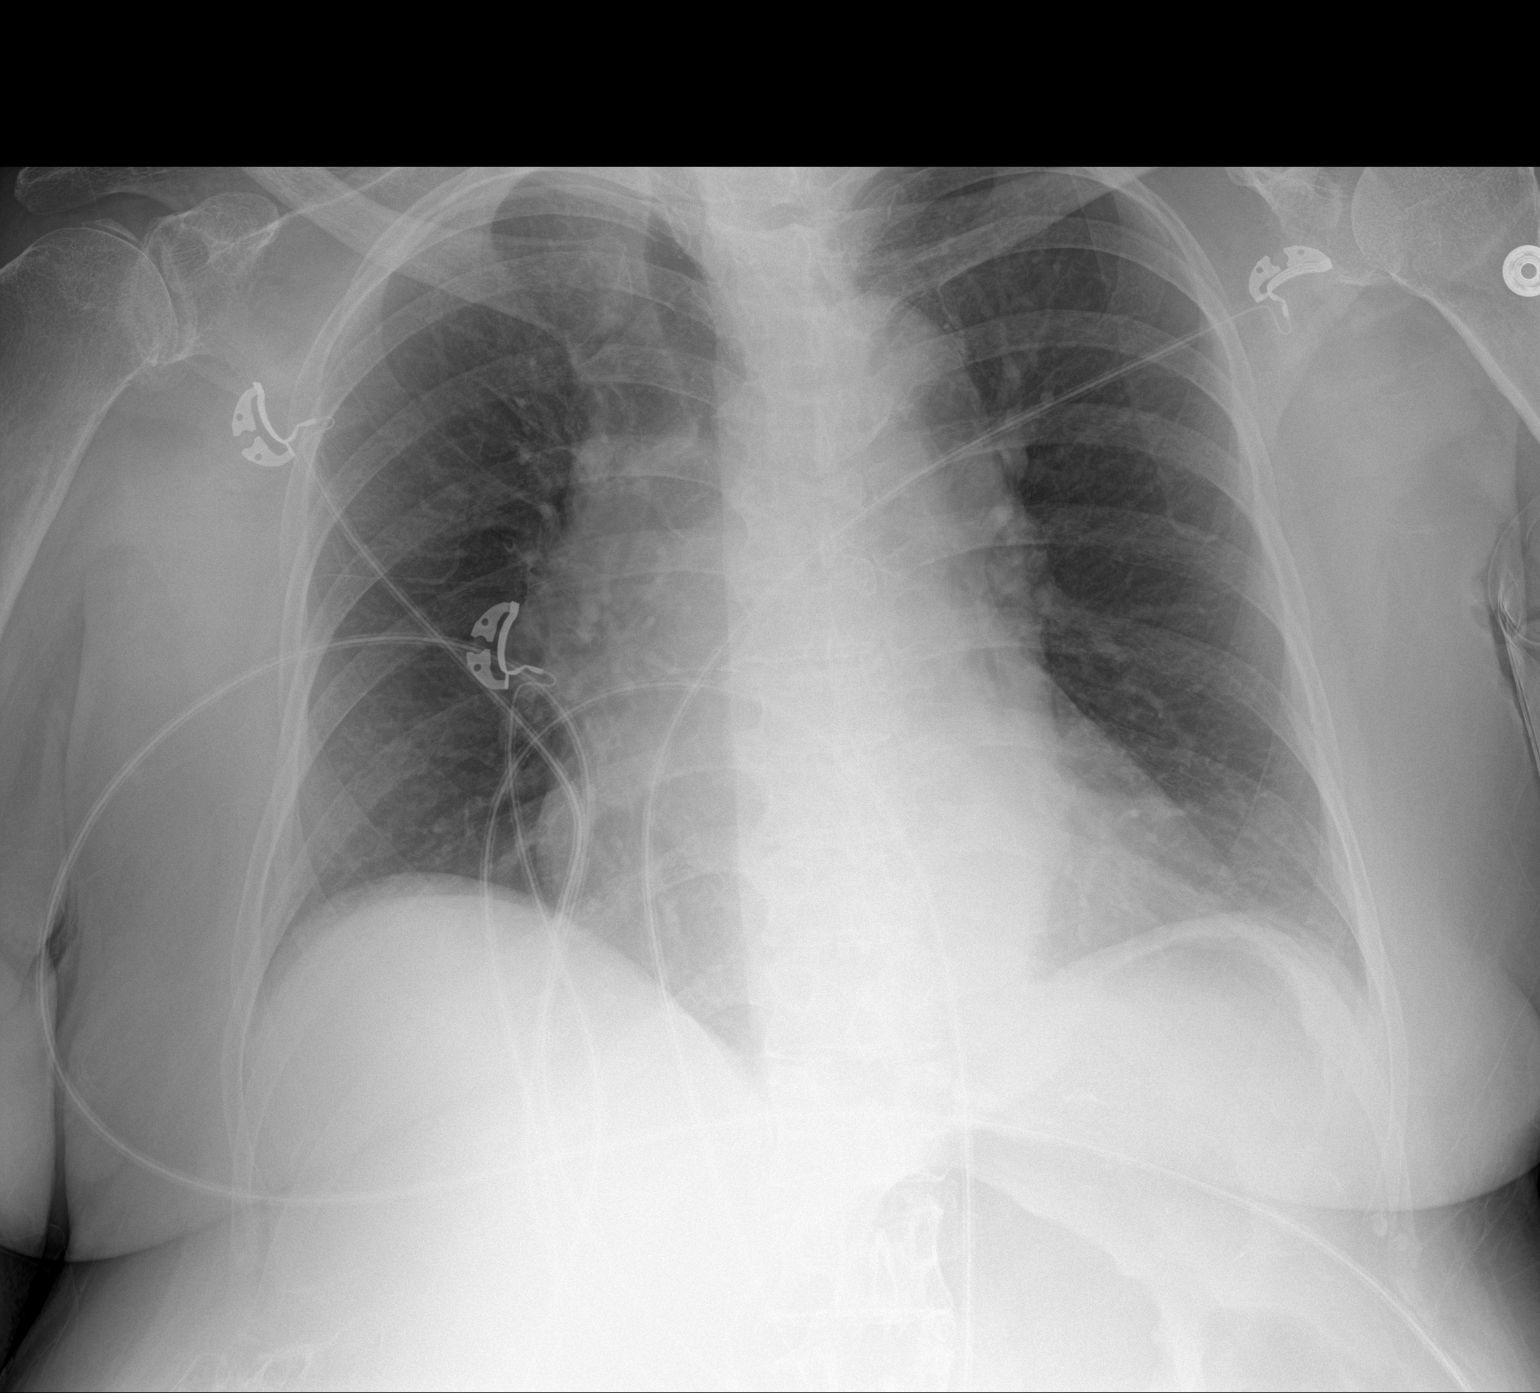

[1 of 1 positions shown; findings below may reference images not displayed]

FINDINGS: Patient is rotated. Mild cardiomegaly, unchanged. Atherosclerosis of
the aortic arch. Pulmonary vasculature is normal. No consolidation,
pleural effusion, or pneumothorax. No acute osseous abnormalities
are seen. Chronic changes of the right shoulder.
IMPRESSION: Stable mild cardiomegaly without acute abnormality.
